# Patient Record
Sex: Male | Born: 1950 | Race: White | Hispanic: No | Marital: Single | Smoking: Former smoker
Health system: Southern US, Community
[De-identification: ages and names within clinical notes are randomized; demographics above are authoritative.]

## PROBLEM LIST (undated history)

## (undated) DIAGNOSIS — E559 Vitamin D deficiency, unspecified: Secondary | ICD-10-CM

## (undated) DIAGNOSIS — M1990 Unspecified osteoarthritis, unspecified site: Secondary | ICD-10-CM

## (undated) DIAGNOSIS — I639 Cerebral infarction, unspecified: Secondary | ICD-10-CM

## (undated) DIAGNOSIS — R7303 Prediabetes: Secondary | ICD-10-CM

## (undated) DIAGNOSIS — E785 Hyperlipidemia, unspecified: Secondary | ICD-10-CM

## (undated) DIAGNOSIS — I10 Essential (primary) hypertension: Secondary | ICD-10-CM

## (undated) DIAGNOSIS — N529 Male erectile dysfunction, unspecified: Secondary | ICD-10-CM

## (undated) DIAGNOSIS — K219 Gastro-esophageal reflux disease without esophagitis: Secondary | ICD-10-CM

## (undated) DIAGNOSIS — G459 Transient cerebral ischemic attack, unspecified: Secondary | ICD-10-CM

## (undated) HISTORY — DX: Prediabetes: R73.03

## (undated) HISTORY — DX: Vitamin D deficiency, unspecified: E55.9

## (undated) HISTORY — PX: HERNIA REPAIR: SHX51

## (undated) HISTORY — DX: Cerebral infarction, unspecified: I63.9

## (undated) HISTORY — DX: Essential (primary) hypertension: I10

## (undated) HISTORY — DX: Hyperlipidemia, unspecified: E78.5

## (undated) HISTORY — DX: Male erectile dysfunction, unspecified: N52.9

## (undated) HISTORY — DX: Unspecified osteoarthritis, unspecified site: M19.90

## (undated) HISTORY — DX: Gastro-esophageal reflux disease without esophagitis: K21.9

## (undated) HISTORY — DX: Transient cerebral ischemic attack, unspecified: G45.9

---

## 2002-04-23 ENCOUNTER — Emergency Department (HOSPITAL_COMMUNITY): Admission: EM | Admit: 2002-04-23 | Discharge: 2002-04-23 | Payer: Self-pay | Admitting: Diagnostic Radiology

## 2002-04-23 ENCOUNTER — Encounter: Payer: Self-pay | Admitting: Emergency Medicine

## 2005-03-19 ENCOUNTER — Ambulatory Visit (HOSPITAL_COMMUNITY): Admission: RE | Admit: 2005-03-19 | Discharge: 2005-03-19 | Payer: Self-pay | Admitting: Internal Medicine

## 2005-05-24 ENCOUNTER — Ambulatory Visit: Payer: Self-pay | Admitting: Gastroenterology

## 2007-03-30 ENCOUNTER — Ambulatory Visit (HOSPITAL_COMMUNITY): Admission: RE | Admit: 2007-03-30 | Discharge: 2007-03-30 | Payer: Self-pay | Admitting: Internal Medicine

## 2008-11-24 ENCOUNTER — Ambulatory Visit (HOSPITAL_COMMUNITY): Admission: RE | Admit: 2008-11-24 | Discharge: 2008-11-24 | Payer: Self-pay | Admitting: Internal Medicine

## 2012-12-09 LAB — HM COLONOSCOPY

## 2013-08-16 ENCOUNTER — Other Ambulatory Visit (HOSPITAL_COMMUNITY): Payer: Self-pay | Admitting: Internal Medicine

## 2013-08-16 ENCOUNTER — Ambulatory Visit (HOSPITAL_COMMUNITY)
Admission: RE | Admit: 2013-08-16 | Discharge: 2013-08-16 | Disposition: A | Discharge status: Home / Self Care | Payer: Self-pay | Source: Ambulatory Visit | Attending: Internal Medicine | Admitting: Internal Medicine

## 2013-08-16 DIAGNOSIS — R059 Cough, unspecified: Secondary | ICD-10-CM | POA: Insufficient documentation

## 2013-08-16 DIAGNOSIS — R05 Cough: Secondary | ICD-10-CM

## 2013-08-27 ENCOUNTER — Encounter: Payer: Self-pay | Admitting: Internal Medicine

## 2013-09-15 ENCOUNTER — Ambulatory Visit: Payer: BC Managed Care – PPO | Admitting: Emergency Medicine

## 2013-09-15 ENCOUNTER — Encounter: Payer: Self-pay | Admitting: Emergency Medicine

## 2013-09-15 VITALS — BP 122/80 | HR 62 | Temp 98.0°F | Resp 16 | Ht 71.0 in | Wt 196.0 lb

## 2013-09-15 DIAGNOSIS — J309 Allergic rhinitis, unspecified: Secondary | ICD-10-CM

## 2013-09-15 DIAGNOSIS — R7303 Prediabetes: Secondary | ICD-10-CM

## 2013-09-15 DIAGNOSIS — E785 Hyperlipidemia, unspecified: Secondary | ICD-10-CM

## 2013-09-15 DIAGNOSIS — I10 Essential (primary) hypertension: Secondary | ICD-10-CM

## 2013-09-15 DIAGNOSIS — K219 Gastro-esophageal reflux disease without esophagitis: Secondary | ICD-10-CM

## 2013-09-15 DIAGNOSIS — E559 Vitamin D deficiency, unspecified: Secondary | ICD-10-CM

## 2013-09-15 DIAGNOSIS — R7309 Other abnormal glucose: Secondary | ICD-10-CM | POA: Insufficient documentation

## 2013-09-15 LAB — LIPID PANEL
HDL: 37 mg/dL — ABNORMAL LOW (ref >39)
LDL Cholesterol: 63 mg/dL (ref 0–99)
Total CHOL/HDL Ratio: 3.5 Ratio
VLDL: 29 mg/dL (ref 0–40)

## 2013-09-15 LAB — BASIC METABOLIC PANEL WITH GFR
BUN: 17 mg/dL (ref 6–23)
CO2: 29 mEq/L (ref 19–32)
Chloride: 103 mEq/L (ref 96–112)
Creat: 0.82 mg/dL (ref 0.50–1.35)
GFR, Est Non African American: >89 mL/min
Potassium: 4 mEq/L (ref 3.5–5.3)

## 2013-09-15 LAB — CBC WITH DIFFERENTIAL/PLATELET
Basophils Absolute: 0.1 10*3/uL (ref 0.0–0.1)
Eosinophils Absolute: 0.3 10*3/uL (ref 0.0–0.7)
Eosinophils Relative: 5 % (ref 0–5)
HCT: 44.1 % (ref 39.0–52.0)
Hemoglobin: 15.1 g/dL (ref 13.0–17.0)
Lymphocytes Relative: 32 % (ref 12–46)
MCV: 94.6 fL (ref 78.0–100.0)
Monocytes Relative: 14 % — ABNORMAL HIGH (ref 3–12)
Neutro Abs: 2.3 10*3/uL (ref 1.7–7.7)
Platelets: 295 10*3/uL (ref 150–400)
RBC: 4.66 MIL/uL (ref 4.22–5.81)
RDW: 13.7 % (ref 11.5–15.5)
WBC: 4.9 10*3/uL (ref 4.0–10.5)

## 2013-09-15 LAB — HEPATIC FUNCTION PANEL
Alkaline Phosphatase: 58 U/L (ref 39–117)
Bilirubin, Direct: 0.1 mg/dL (ref 0.0–0.3)
Indirect Bilirubin: 0.3 mg/dL (ref 0.0–0.9)
Total Bilirubin: 0.4 mg/dL (ref 0.3–1.2)

## 2013-09-15 LAB — MAGNESIUM: Magnesium: 1.7 mg/dL (ref 1.5–2.5)

## 2013-09-15 NOTE — Patient Instructions (Signed)
Allergic Rhinitis Allergic rhinitis is when the mucous membranes in the nose respond to allergens. Allergens are particles in the air that cause your body to have an allergic reaction. This causes you to release allergic antibodies. Through a chain of events, these eventually cause you to release histamine into the blood stream (hence the use of antihistamines). Although meant to be protective to the body, it is this release that causes your discomfort, such as frequent sneezing, congestion and an itchy runny nose.  CAUSES  The pollen allergens may come from grasses, trees, and weeds. This is seasonal allergic rhinitis, or "hay fever." Other allergens cause year-round allergic rhinitis (perennial allergic rhinitis) such as house dust mite allergen, pet dander and mold spores.  SYMPTOMS   Nasal stuffiness (congestion).  Runny, itchy nose with sneezing and tearing of the eyes.  There is often an itching of the mouth, eyes and ears. It cannot be cured, but it can be controlled with medications. DIAGNOSIS  If you are unable to determine the offending allergen, skin or blood testing may find it. TREATMENT   Avoid the allergen.  Medications and allergy shots (immunotherapy) can help.  Hay fever may often be treated with antihistamines in pill or nasal spray forms. Antihistamines block the effects of histamine. There are over-the-counter medicines that may help with nasal congestion and swelling around the eyes. Check with your caregiver before taking or giving this medicine. If the treatment above does not work, there are many new medications your caregiver can prescribe. Stronger medications may be used if initial measures are ineffective. Desensitizing injections can be used if medications and avoidance fails. Desensitization is when a patient is given ongoing shots until the body becomes less sensitive to the allergen. Make sure you follow up with your caregiver if problems continue. SEEK MEDICAL  CARE IF:   You develop fever (more than 100.5 F (38.1 C).  You develop a cough that does not stop easily (persistent).  You have shortness of breath.  You start wheezing.  Symptoms interfere with normal daily activities. Document Released: 07/23/2001 Document Revised: 01/20/2012 Document Reviewed: 02/01/2009 [facility] Patient Information 2014 [place], [place]. Blood Glucose Monitoring, Adult Monitoring your blood glucose (also know as blood sugar) helps you to manage your diabetes. It also helps you and your health care provider monitor your diabetes and determine how well your treatment plan is working. WHY SHOULD YOU MONITOR YOUR BLOOD GLUCOSE?  It can help you understand how food, exercise, and medicine affect your blood glucose.  It allows you to know what your blood glucose is at any given moment. You can quickly tell if you are having low blood glucose (hypoglycemia) or high blood glucose (hyperglycemia).  It can help you and your health care provider know how to adjust your medicines.  It can help you understand how to manage an illness or adjust medicine for exercise. WHEN SHOULD YOU TEST? Your health care provider will help you decide how often you should check your blood glucose. This may depend on the type of diabetes you have, your diabetes control, or the types of medicines you are taking. Be sure to write down all of your blood glucose readings so that this information can be reviewed with your health care provider. See below for examples of testing times that your health care provider may suggest. Type 1 Diabetes  Test 4 times a day if you are in good control, using an insulin pump, or perform multiple daily injections.  If your diabetes  is not well-controlled or if you are sick, you may need to monitor more often.  It is a good idea to also monitor:  Before and after exercise.  Between meals and 2 hours after a meal.  Occasionally between 2:00 to 3:00 am. Type 2  Diabetes  It can vary with each person, but generally, if you are on insulin, test 4 times a day.  If you take medicines by mouth (orally), test 2 times a day.  If you are on a controlled diet, test once a day.  If your diabetes is not well controlled or if you are sick, you may need to monitor more often. HOW TO MONITOR YOUR BLOOD GLUCOSE Supplies Needed  Blood glucose meter.  Test strips for your meter. Each meter has its own strips. You must use the strips that go with your own meter.  A pricking needle (lancet).  A device that holds the lancet (lancing device).  A journal or log book to write down your results. Procedure  Wash your hands with soap and water. Alcohol is not preferred.  Prick the side of your finger (not the tip) with the lancet.  Gently milk the finger until a small drop of blood appears.  Follow the instructions that come with your meter for inserting the test strip, applying blood to the strip, and using your blood glucose meter. Other Areas to Get Blood for Testing Some meters allow you to use other areas of your body (other than your finger) to test your blood. These areas are called alternative sites. The most common alternative sites are:  The forearm.  The thigh.  The back area of the lower leg.  The palm of the hand. The blood flow in these areas is slower. Therefore, the blood glucose values you get may be delayed, and the numbers are different from what you would get from your fingers. Do not use alternative sites if you think you are having hypoglycemia. Your reading will not be accurate. Always use a finger if you are having hypoglycemia. Also, if you cannot feel your lows (hypoglycemia unawareness), always use your fingers for your blood glucose checks. ADDITIONAL TIPS FOR GLUCOSE MONITORING  Do not reuse lancets.  Always carry your supplies with you.  All blood glucose meters have a 24-hour "hotline" number to call if you have questions  or need help.  Adjust (calibrate) your blood glucose meter with a control solution after finishing a few boxes of strips. BLOOD GLUCOSE RECORD KEEPING It is a good idea to keep a daily record or log of your blood glucose readings. Most glucose meters, if not all, keep your glucose records stored in the meter. Some meters come with the ability to download your records to your home computer. Keeping a record of your blood glucose readings is especially helpful if you are wanting to look for patterns. Make notes to go along with the blood glucose readings because you might forget what happened at that exact time. Keeping good records helps you and your health care provider to work together to achieve good diabetes management.  Document Released: 10/31/2003 Document Revised: 06/30/2013 Document Reviewed: 03/22/2013 [facility] Patient Information 2014 [place], [place]. Hypertriglyceridemia  Diet for High blood levels of Triglycerides Most fats in food are triglycerides. Triglycerides in your blood are stored as fat in your body. High levels of triglycerides in your blood may put you at a greater risk for heart disease and stroke.  Normal triglyceride levels are less than 150  mg/dL. Borderline high levels are 150-199 mg/dl. High levels are 200 - 499 mg/dL, and very high triglyceride levels are greater than 500 mg/dL. The decision to treat high triglycerides is generally based on the level. For people with borderline or high triglyceride levels, treatment includes weight loss and exercise. Drugs are recommended for people with very high triglyceride levels. Many people who need treatment for high triglyceride levels have metabolic syndrome. This syndrome is a collection of disorders that often include: insulin resistance, high blood pressure, blood clotting problems, high cholesterol and triglycerides. TESTING PROCEDURE FOR TRIGLYCERIDES  You should not eat 4 hours before getting your triglycerides measured. The  normal range of triglycerides is between 10 and 250 milligrams per deciliter (mg/dl). Some people may have extreme levels (1000 or above), but your triglyceride level may be too high if it is above 150 mg/dl, depending on what other risk factors you have for heart disease.  People with high blood triglycerides may also have high blood cholesterol levels. If you have high blood cholesterol as well as high blood triglycerides, your risk for heart disease is probably greater than if you only had high triglycerides. High blood cholesterol is one of the main risk factors for heart disease. CHANGING YOUR DIET  Your weight can affect your blood triglyceride level. If you are more than 20% above your ideal body weight, you may be able to lower your blood triglycerides by losing weight. Eating less and exercising regularly is the best way to combat this. Fat provides more calories than any other food. The best way to lose weight is to eat less fat. Only 30% of your total calories should come from fat. Less than 7% of your diet should come from saturated fat. A diet low in fat and saturated fat is the same as a diet to decrease blood cholesterol. By eating a diet lower in fat, you may lose weight, lower your blood cholesterol, and lower your blood triglyceride level.  Eating a diet low in fat, especially saturated fat, may also help you lower your blood triglyceride level. Ask your dietitian to help you figure how much fat you can eat based on the number of calories your caregiver has prescribed for you.  Exercise, in addition to helping with weight loss may also help lower triglyceride levels.   Alcohol can increase blood triglycerides. You may need to stop drinking alcoholic beverages.  Too much carbohydrate in your diet may also increase your blood triglycerides. Some complex carbohydrates are necessary in your diet. These may include bread, rice, potatoes, other starchy vegetables and cereals.  Reduce "simple"  carbohydrates. These may include pure sugars, candy, honey, and jelly without losing other nutrients. If you have the kind of high blood triglycerides that is affected by the amount of carbohydrates in your diet, you will need to eat less sugar and less high-sugar foods. Your caregiver can help you with this.  Adding 2-4 grams of fish oil (EPA+ DHA) may also help lower triglycerides. Speak with your caregiver before adding any supplements to your regimen. Following the Diet  Maintain your ideal weight. Your caregivers can help you with a diet. Generally, eating less food and getting more exercise will help you lose weight. Joining a weight control group may also help. Ask your caregivers for a good weight control group in your area.  Eat low-fat foods instead of high-fat foods. This can help you lose weight too.  These foods are lower in fat. Eat MORE of these:  Dried beans, peas, and lentils.  Egg whites.  Low-fat cottage cheese.  Fish.  Lean cuts of meat, such as round, sirloin, rump, and flank (cut extra fat off meat you fix).  Whole grain breads, cereals and pasta.  Skim and nonfat dry milk.  Low-fat yogurt.  Poultry without the skin.  Cheese made with skim or part-skim milk, such as mozzarella, parmesan, farmers', ricotta, or pot cheese. These are higher fat foods. Eat LESS of these:   Whole milk and foods made from whole milk, such as American, blue, cheddar, monterey jack, and swiss cheese  High-fat meats, such as luncheon meats, sausages, knockwurst, bratwurst, hot dogs, ribs, corned beef, ground pork, and regular ground beef.  Fried foods. Limit saturated fats in your diet. Substituting unsaturated fat for saturated fat may decrease your blood triglyceride level. You will need to read package labels to know which products contain saturated fats.  These foods are high in saturated fat. Eat LESS of these:   Fried pork skins.  Whole milk.  Skin and fat from  poultry.  Palm oil.  Butter.  Shortening.  Cream cheese.  Tomasa Blase.  Margarines and baked goods made from listed oils.  Vegetable shortenings.  Chitterlings.  Fat from meats.  Coconut oil.  Palm kernel oil.  Lard.  Cream.  Sour cream.  Fatback.  Coffee whiteners and non-dairy creamers made with these oils.  Cheese made from whole milk. Use unsaturated fats (both polyunsaturated and monounsaturated) moderately. Remember, even though unsaturated fats are better than saturated fats; you still want a diet low in total fat.  These foods are high in unsaturated fat:   Canola oil.  Sunflower oil.  Mayonnaise.  Almonds.  Peanuts.  Pine nuts.  Margarines made with these oils.  Safflower oil.  Olive oil.  Avocados.  Cashews.  Peanut butter.  Sunflower seeds.  Soybean oil.  Peanut oil.  Olives.  Pecans.  Walnuts.  Pumpkin seeds. Avoid sugar and other high-sugar foods. This will decrease carbohydrates without decreasing other nutrients. Sugar in your food goes rapidly to your blood. When there is excess sugar in your blood, your liver may use it to make more triglycerides. Sugar also contains calories without other important nutrients.  Eat LESS of these:   Sugar, brown sugar, powdered sugar, jam, jelly, preserves, honey, syrup, molasses, pies, candy, cakes, cookies, frosting, pastries, colas, soft drinks, punches, fruit drinks, and regular gelatin.  Avoid alcohol. Alcohol, even more than sugar, may increase blood triglycerides. In addition, alcohol is high in calories and low in nutrients. Ask for sparkling water, or a diet soft drink instead of an alcoholic beverage. Suggestions for planning and preparing meals   Bake, broil, grill or roast meats instead of frying.  Remove fat from meats and skin from poultry before cooking.  Add spices, herbs, lemon juice or vinegar to vegetables instead of salt, rich sauces or gravies.  Use a non-stick  skillet without fat or use no-stick sprays.  Cool and refrigerate stews and broth. Then remove the hardened fat floating on the surface before serving.  Refrigerate meat drippings and skim off fat to make low-fat gravies.  Serve more fish.  Use less butter, margarine and other high-fat spreads on bread or vegetables.  Use skim or reconstituted non-fat dry milk for cooking.  Cook with low-fat cheeses.  Substitute low-fat yogurt or cottage cheese for all or part of the sour cream in recipes for sauces, dips or congealed salads.  Use half yogurt/half mayonnaise in salad  recipes.  Substitute evaporated skim milk for cream. Evaporated skim milk or reconstituted non-fat dry milk can be whipped and substituted for whipped cream in certain recipes.  Choose fresh fruits for dessert instead of high-fat foods such as pies or cakes. Fruits are naturally low in fat. When Dining Out   Order low-fat appetizers such as fruit or vegetable juice, pasta with vegetables or tomato sauce.  Select clear, rather than cream soups.  Ask that dressings and gravies be served on the side. Then use less of them.  Order foods that are baked, broiled, poached, steamed, stir-fried, or roasted.  Ask for margarine instead of butter, and use only a small amount.  Drink sparkling water, unsweetened tea or coffee, or diet soft drinks instead of alcohol or other sweet beverages. QUESTIONS AND ANSWERS ABOUT OTHER FATS IN THE BLOOD: SATURATED FAT, TRANS FAT, AND CHOLESTEROL What is trans fat? Trans fat is a type of fat that is formed when vegetable oil is hardened through a process called hydrogenation. This process helps makes foods more solid, gives them shape, and prolongs their shelf life. Trans fats are also called hydrogenated or partially hydrogenated oils.  What do saturated fat, trans fat, and cholesterol in foods have to do with heart disease? Saturated fat, trans fat, and cholesterol in the diet all raise  the level of LDL "bad" cholesterol in the blood. The higher the LDL cholesterol, the greater the risk for coronary heart disease (CHD). Saturated fat and trans fat raise LDL similarly.  What foods contain saturated fat, trans fat, and cholesterol? High amounts of saturated fat are found in animal products, such as fatty cuts of meat, chicken skin, and full-fat dairy products like butter, whole milk, cream, and cheese, and in tropical vegetable oils such as palm, palm kernel, and coconut oil. Trans fat is found in some of the same foods as saturated fat, such as vegetable shortening, some margarines (especially hard or stick margarine), crackers, cookies, baked goods, fried foods, salad dressings, and other processed foods made with partially hydrogenated vegetable oils. Small amounts of trans fat also occur naturally in some animal products, such as milk products, beef, and lamb. Foods high in cholesterol include liver, other organ meats, egg yolks, shrimp, and full-fat dairy products. How can I use the new food label to make heart-healthy food choices? Check the Nutrition Facts panel of the food label. Choose foods lower in saturated fat, trans fat, and cholesterol. For saturated fat and cholesterol, you can also use the Percent Daily Value (%DV): 5% DV or less is low, and 20% DV or more is high. (There is no %DV for trans fat.) Use the Nutrition Facts panel to choose foods low in saturated fat and cholesterol, and if the trans fat is not listed, read the ingredients and limit products that list shortening or hydrogenated or partially hydrogenated vegetable oil, which tend to be high in trans fat. POINTS TO REMEMBER:   Discuss your risk for heart disease with your caregivers, and take steps to reduce risk factors.  Change your diet. Choose foods that are low in saturated fat, trans fat, and cholesterol.  Add exercise to your daily routine if it is not already being done. Participate in physical activity  of moderate intensity, like brisk walking, for at least 30 minutes on most, and preferably all days of the week. No time? Break the 30 minutes into three, 10-minute segments during the day.  Stop smoking. If you do smoke, contact your caregiver to  discuss ways in which they can help you quit.  Do not use street drugs.  Maintain a normal weight.  Maintain a healthy blood pressure.  Keep up with your blood work for checking the fats in your blood as directed by your caregiver. Document Released: 08/15/2004 Document Revised: 04/28/2012 Document Reviewed: 03/13/2009 [facility] Patient Information 2014 [place], [place]. Hypertension Hypertension is another name for high blood pressure. High blood pressure may mean that your heart needs to work harder to pump blood. Blood pressure consists of two numbers, which includes a higher number over a lower number (example: 110/72). HOME CARE   Make lifestyle changes as told by your doctor. This may include weight loss and exercise.  Take your blood pressure medicine every day.  Limit how much salt you use.  Stop smoking if you smoke.  Do not use drugs.  Talk to your doctor if you are using decongestants or birth control pills. These medicines might make blood pressure higher.  Females should not drink more than 1 alcoholic drink per day. Males should not drink more than 2 alcoholic drinks per day.  See your doctor as told. GET HELP RIGHT AWAY IF:   You have a blood pressure reading with a top number of 180 or higher.  You get a very bad headache.  You get blurred or changing vision.  You feel confused.  You feel weak, numb, or faint.  You get chest or belly (abdominal) pain.  You throw up (vomit).  You cannot breathe very well. MAKE SURE YOU:   Understand these instructions.  Will watch your condition.  Will get help right away if you are not doing well or get worse. Document Released: 04/15/2008 Document Revised: 01/20/2012  Document Reviewed: 04/15/2008 [facility] Patient Information 2014 [place], [place].

## 2013-09-16 ENCOUNTER — Telehealth: Payer: Self-pay | Admitting: *Deleted

## 2013-09-16 ENCOUNTER — Encounter: Payer: Self-pay | Admitting: Emergency Medicine

## 2013-09-16 ENCOUNTER — Telehealth: Payer: Self-pay | Admitting: Internal Medicine

## 2013-09-16 DIAGNOSIS — E559 Vitamin D deficiency, unspecified: Secondary | ICD-10-CM

## 2013-09-16 DIAGNOSIS — K219 Gastro-esophageal reflux disease without esophagitis: Secondary | ICD-10-CM

## 2013-09-16 LAB — HEMOGLOBIN A1C
Hgb A1c MFr Bld: 5.7 % — ABNORMAL HIGH (ref <5.7)
Mean Plasma Glucose: 117 mg/dL — ABNORMAL HIGH (ref <117)

## 2013-09-16 MED ORDER — VITAMIN D 1000 UNITS PO TABS: 50000.0000 [IU] | ORAL_TABLET | Freq: Every day | ORAL | Status: DC

## 2013-09-16 MED ORDER — PANTOPRAZOLE SODIUM 40 MG PO TBEC: 40.0000 mg | DELAYED_RELEASE_TABLET | Freq: Every day | ORAL | Status: DC

## 2013-09-16 NOTE — Progress Notes (Signed)
Subjective:    Patient ID: Steven Bray, male    DOB: [birth year], 62 y.o.   MRN: [id number]  HPI Comments: 62 yo male comes in for 3 month F/U for HTN, Cholesterol, PRE-DM and Vit D. He is trying to decrease red meat and improve diet. He exercises with work only. His BP has been good at home. He is still feeling congested in his ears but denies taking OTC for relief. He completed Doxycycline and Prednisone prescribed 10/6 with some relief. He would also like sx of Cialis 5mg  qd to assist with his hx of ED. His last labs were T 107 TG 150 HDL 30 LDL 47 A1C 5.6 MAG 1.8 D 103   Current Outpatient Prescriptions on File Prior to Visit  Medication Sig Dispense Refill  . aspirin 81 MG tablet Take 81 mg by mouth daily.      Marland Kitchen atenolol (TENORMIN) 100 MG tablet Take 100 mg by mouth daily.      Marland Kitchen atorvastatin (LIPITOR) 80 MG tablet Take 80 mg by mouth daily. TAKES 1/2      . cholecalciferol (VITAMIN D) 1000 UNITS tablet Take 50,000 Units by mouth daily. ONLY MWF      . hydrochlorothiazide (HYDRODIURIL) 25 MG tablet Take 25 mg by mouth daily. TAKES 1/2      . lisinopril (PRINIVIL,ZESTRIL) 40 MG tablet Take 40 mg by mouth daily. TAKES 1/2       No current facility-administered medications on file prior to visit.  Allergies Sulfa antibiotics  Past Medical History  Diagnosis Date  . Hypertension   . Hyperlipidemia   . Pre-diabetes   . GERD (gastroesophageal reflux disease)   . Vitamin D deficiency   . TIA (transient ischemic attack)   . Stroke     Review of Systems  Constitutional: Negative.   HENT: Positive for congestion and postnasal drip.   Eyes: Negative.   Respiratory: Negative.   Cardiovascular: Negative.   Gastrointestinal: Negative.   Genitourinary:       Difficulty maintaining erection, NO Change  Musculoskeletal: Negative.   Skin: Negative.   Allergic/Immunologic: Negative.   Neurological: Negative.   Psychiatric/Behavioral: Negative.       BP 122/80  Pulse 62   Temp(Src) 98 F (36.7 C)  Resp 16  Ht 5\' 11"  (1.803 m)  Wt 196 lb (88.905 kg)  BMI 27.35 kg/m2  Objective:   Physical Exam  Nursing note and vitals reviewed. Constitutional: He is oriented to person, place, and time. He appears well-developed and well-nourished.  HENT:  Head: Normocephalic and atraumatic.  Right Ear: External ear normal.  Left Ear: External ear normal.  Nose: Nose normal.  Mouth/Throat: Oropharynx is clear and moist.  Cloudy TM's, Clear exudate post pharynx  Neck: Normal range of motion. Neck supple.  Cardiovascular: Normal rate, regular rhythm, normal heart sounds and intact distal pulses.   Pulmonary/Chest: Effort normal and breath sounds normal.  Abdominal: Soft. Bowel sounds are normal.  Musculoskeletal: Normal range of motion.  Neurological: He is alert and oriented to person, place, and time. He has normal reflexes.  Skin: Skin is warm and dry.  Psychiatric: He has a normal mood and affect. Judgment and thought content normal.          Assessment & Plan:  HTN, Cholesterol, Pre-DM, D-Def check labs. Advised increase cardio and improve diet. Continue RX/ Vitamins AD.  Allergic Rhinitis add allegra OTC AD. ED- SX of Cialis 5 mg 1 QD #30 given, call if wants RX  Pt aware of risks with RX/ SX.

## 2013-09-16 NOTE — Telephone Encounter (Signed)
Spoke with pt about 09/15/13 labs.  Per pt he has already decreased Atorvastatin Rx to 1/2 M,W,F.  Per Loree Fee, PA-C pt advised to decrease to taking 1/2 Mondays and Fridays only.  Pt advised to increase exercise AD.

## 2013-09-16 NOTE — Telephone Encounter (Signed)
Message copied by Nicholaus Corolla A on Thu Sep 16, 2013  1:37 PM ------      Message from: [place], [place] R      Created: Thu Sep 16, 2013  8:51 AM       Chol great can decrease to MWF only. HDL low needs to add cardio outside of work. A1c still elevated need better diet and wt loss. Rest ok No sign of infection. ------

## 2013-09-16 NOTE — Telephone Encounter (Signed)
Fax refill for pantoprazole sod dr 40mg  tab 30qty 12refills [facility] [address] tele # [phone]

## 2013-12-03 ENCOUNTER — Other Ambulatory Visit: Payer: Self-pay | Admitting: Internal Medicine

## 2013-12-12 DIAGNOSIS — Z79899 Other long term (current) drug therapy: Secondary | ICD-10-CM | POA: Insufficient documentation

## 2013-12-12 NOTE — Patient Instructions (Addendum)
Benign Prostatic Hypertrophy  The prostate gland is part of the reproductive system of men. A normal prostate is about the size and shape of a walnut. The prostate gland produces a fluid that is mixed with sperm to make semen. This gland surrounds the urethra and is located in front of the rectum and just below the bladder. The bladder is where urine is stored. The urethra is the tube through which urine passes from the bladder to get out of the body. The prostate grows as a man ages. An enlarged prostate not caused by cancer is called benign prostatic hypertrophy (BPH). An enlarged prostate can press on the urethra. This can make it harder to pass urine. In the early stages of enlargement, the bladder can get by with a narrowed urethra by forcing the urine through. If the problem gets worse, medical or surgical treatment may be required.  This condition should be followed by your health care provider. The accumulation of urine in the bladder can cause infection. Back pressure and infection can progress to bladder damage and kidney (renal) failure. If needed, your health care provider may refer you to a specialist in kidney and prostate disease (urologist). CAUSES  BPH is a common health problem in men older than 50 years. This condition is a normal part of aging. However, not all men will develop problems from this condition. If the enlargement grows away from the urethra, then there will not be any compression of the urethra and resistance to urine flow.If the growth is toward the urethra and compresses it, you will experience difficulty urinating.  SYMPTOMS   Not able to completely empty your bladder.  Getting up often during the night to urinate.  Need to urinate frequently during the day.  Difficultly starting urine flow.  Decrease in size and strength of your urine stream.  Dribbling after urination.  Pain on urination (more common with infection).  Inability to pass urine. This needs  immediate treatment.  The development of a urinary tract infection. DIAGNOSIS  These tests will help your health care provider understand your problem:  A thorough history and physical examination.  A urination history, with the number of times you urinate, the amounts of urine, the strength of the urine stream, and the feeling of emptiness or fullness after urinating.  A postvoid bladder scan that measures any amount of urine that may remain in your bladder after you finish urinating.  Digital rectal exam. In a rectal exam, your health care provider checks your prostate by putting a gloved, lubricated finger into your rectum to feel the back of your prostate gland. This exam detects the size of your gland and abnormal lumps or growths.  Exam of your urine (urinalysis).  Prostate specific antigen (PSA) screening. This is a blood test used to screen for prostate cancer.  Rectal ultrasonography. This test uses sound waves to electronically produce a picture of your prostate gland. TREATMENT  Once symptoms begin, your health care provider will monitor your condition. Of the men with this condition, one third will have symptoms that stabilize, one third will have symptoms that improve, and one third will have symptoms that progress in the first year. Mild symptoms may not need treatment. Simple observation and yearly exams may be all that is required. Medicines and surgery are options for more severe problems. Your health care provider can help you make an informed decision for what is best. Two classes of medicines are available for relief of prostate symptoms:  Medicines that shrink the prostate. This helps relieve symptoms. These medicines take time to work, and it may be months before any improvement is seen.  Uncommon side effects include problems with sexual function.  Medicines to relax the muscle of the prostate. This also relieves the obstruction by reducing any compression on the  urethra.This group of medicines work much faster than those that reduce the size of the prostate gland. Usually, one can experience improvement in days to weeks..  Side effects can include dizziness, fatigue, lightheadedness, and retrograde ejaculation (diminished volume of ejaculate). Several types of surgical treatments are available for relief of prostate symptoms:  Transurethral resection of the prostate (TURP) In this treatment, an instrument is inserted through opening at the tip of the penis. It is used to cut away pieces of the inner core of the prostate. The pieces are removed through the same opening of the penis. This removes the obstruction and helps get rid of the symptoms.  Transurethral incision (TUIP) In this procedure, small cuts are made in the prostate. This lessens the prostates pressure on the urethra.  Transurethral microwave thermotherapy (TUMT) This procedure uses microwaves to create heat. The heat destroys and removes a small amount of prostate tissue.  Transurethral needle ablation (TUNA) This is a procedure that uses radio frequencies to do the same as TUMT.  Interstitial laser coagulation (ILC) This is a procedure that uses a laser to do the same as TUMT and TUNA.  Transurethral electrovaporization (TUVP) This is a procedure that uses electrodes to do the same as the procedures listed above. SEEK MEDICAL CARE IF:   You develop a fever.  There is unexplained back pain.  Symptoms are not helped by medicines prescribed.  You develop side effects from the medicine you are taking.  Your urine becomes very dark or has a bad smell.  Your lower abdomen becomes distended and you have difficulty passing your urine. SEEK IMMEDIATE MEDICAL CARE IF:   You are suddenly unable to urinate. This is an emergency. You should be seen immediately.  There are large amounts of blood or clots in the urine.  Your urinary problems become unmanageable.  You develop  lightheadedness, severe dizziness, or you feel faint.  You develop moderate to severe low back or flank pain.  You develop chills or fever. Document Released: 10/28/2005 Document Revised: 08/18/2013 Document Reviewed: 05/13/2013 [facility] Patient Information 2014 [place], [place]. Hypertension As your heart beats, it forces blood through your arteries. This force is your blood pressure. If the pressure is too high, it is called hypertension (HTN) or high blood pressure. HTN is dangerous because you may have it and not know it. High blood pressure may mean that your heart has to work harder to pump blood. Your arteries may be narrow or stiff. The extra work puts you at risk for heart disease, stroke, and other problems.  Blood pressure consists of two numbers, a higher number over a lower, 110/72, for example. It is stated as "110 over 72." The ideal is below 120 for the top number (systolic) and under 80 for the bottom (diastolic). Write down your blood pressure today. You should pay close attention to your blood pressure if you have certain conditions such as:  Heart failure.  Prior heart attack.  Diabetes  Chronic kidney disease.  Prior stroke.  Multiple risk factors for heart disease. To see if you have HTN, your blood pressure should be measured while you are seated with your arm held at the  level of the heart. It should be measured at least twice. A one-time elevated blood pressure reading (especially in the Emergency Department) does not mean that you need treatment. There may be conditions in which the blood pressure is different between your right and left arms. It is important to see your caregiver soon for a recheck. Most people have essential hypertension which means that there is not a specific cause. This type of high blood pressure may be lowered by changing lifestyle factors such as:  Stress.  Smoking.  Lack of exercise.  Excessive weight.  Drug/tobacco/alcohol  use.  Eating less salt. Most people do not have symptoms from high blood pressure until it has caused damage to the body. Effective treatment can often prevent, delay or reduce that damage. TREATMENT  When a cause has been identified, treatment for high blood pressure is directed at the cause. There are a large number of medications to treat HTN. These fall into several categories, and your caregiver will help you select the medicines that are best for you. Medications may have side effects. You should review side effects with your caregiver. If your blood pressure stays high after you have made lifestyle changes or started on medicines,   Your medication(s) may need to be changed.  Other problems may need to be addressed.  Be certain you understand your prescriptions, and know how and when to take your medicine.  Be sure to follow up with your caregiver within the time frame advised (usually within two weeks) to have your blood pressure rechecked and to review your medications.  If you are taking more than one medicine to lower your blood pressure, make sure you know how and at what times they should be taken. Taking two medicines at the same time can result in blood pressure that is too low. SEEK IMMEDIATE MEDICAL CARE IF:  You develop a severe headache, blurred or changing vision, or confusion.  You have unusual weakness or numbness, or a faint feeling.  You have severe chest or abdominal pain, vomiting, or breathing problems. MAKE SURE YOU:   Understand these instructions.  Will watch your condition.  Will get help right away if you are not doing well or get worse.   Diabetes and Exercise Exercising regularly is important. It is not just about losing weight. It has many health benefits, such as:  Improving your overall fitness, flexibility, and endurance.  Increasing your bone density.  Helping with weight control.  Decreasing your body fat.  Increasing your muscle  strength.  Reducing stress and tension.  Improving your overall health. People with diabetes who exercise gain additional benefits because exercise:  Reduces appetite.  Improves the body's use of blood sugar (glucose).  Helps lower or control blood glucose.  Decreases blood pressure.  Helps control blood lipids (such as cholesterol and triglycerides).  Improves the body's use of the hormone insulin by:  Increasing the body's insulin sensitivity.  Reducing the body's insulin needs.  Decreases the risk for heart disease because exercising:  Lowers cholesterol and triglycerides levels.  Increases the levels of good cholesterol (such as high-density lipoproteins [HDL]) in the body.  Lowers blood glucose levels. YOUR ACTIVITY PLAN  Choose an activity that you enjoy and set realistic goals. Your health care provider or diabetes educator can help you make an activity plan that works for you. You can break activities into 2 or 3 sessions throughout the day. Doing so is as good as one long session. Exercise ideas include:  Taking the dog for a walk.  Taking the stairs instead of the elevator.  Dancing to your favorite song.  Doing your favorite exercise with a friend. RECOMMENDATIONS FOR EXERCISING WITH TYPE 1 OR TYPE 2 DIABETES   Check your blood glucose before exercising. If blood glucose levels are greater than 240 mg/dL, check for urine ketones. Do not exercise if ketones are present.  Avoid injecting insulin into areas of the body that are going to be exercised. For example, avoid injecting insulin into:  The arms when playing tennis.  The legs when jogging.  Keep a record of:  Food intake before and after you exercise.  Expected peak times of insulin action.  Blood glucose levels before and after you exercise.  The type and amount of exercise you have done.  Review your records with your health care provider. Your health care provider will help you to develop  guidelines for adjusting food intake and insulin amounts before and after exercising.  If you take insulin or oral hypoglycemic agents, watch for signs and symptoms of hypoglycemia. They include:  Dizziness.  Shaking.  Sweating.  Chills.  Confusion.  Drink plenty of water while you exercise to prevent dehydration or heat stroke. Body water is lost during exercise and must be replaced.  Talk to your health care provider before starting an exercise program to make sure it is safe for you. Remember, almost any type of activity is better than none.    Cholesterol Cholesterol is a white, waxy, fat-like protein needed by your body in small amounts. The liver makes all the cholesterol you need. It is carried from the liver by the blood through the blood vessels. Deposits (plaque) may build up on blood vessel walls. This makes the arteries narrower and stiffer. Plaque increases the risk for heart attack and stroke. You cannot feel your cholesterol level even if it is very high. The only way to know is by a blood test to check your lipid (fats) levels. Once you know your cholesterol levels, you should keep a record of the test results. Work with your caregiver to to keep your levels in the desired range. WHAT THE RESULTS MEAN:  Total cholesterol is a rough measure of all the cholesterol in your blood.  LDL is the so-called bad cholesterol. This is the type that deposits cholesterol in the walls of the arteries. You want this level to be low.  HDL is the good cholesterol because it cleans the arteries and carries the LDL away. You want this level to be high.  Triglycerides are fat that the body can either burn for energy or store. High levels are closely linked to heart disease. DESIRED LEVELS:  Total cholesterol below 200.  LDL below 100 for people at risk, below 70 for very high risk.  HDL above 50 is good, above 60 is best.  Triglycerides below 150. HOW TO LOWER YOUR  CHOLESTEROL:  Diet.  Choose fish or white meat chicken and [place], roasted or baked. Limit fatty cuts of red meat, fried foods, and processed meats, such as sausage and lunch meat.  Eat lots of fresh fruits and vegetables. Choose whole grains, beans, pasta, potatoes and cereals.  Use only small amounts of olive, corn or canola oils. Avoid butter, mayonnaise, shortening or palm kernel oils. Avoid foods with trans-fats.  Use skim/nonfat milk and low-fat/nonfat yogurt and cheeses. Avoid whole milk, cream, ice cream, egg yolks and cheeses. Healthy desserts include angel food cake, ginger snaps, animal crackers,  hard candy, popsicles, and low-fat/nonfat frozen yogurt. Avoid pastries, cakes, pies and cookies.  Exercise.  A regular program helps decrease LDL and raises HDL.  Helps with weight control.  Do things that increase your activity level like gardening, walking, or taking the stairs.  Medication.  May be prescribed by your caregiver to help lowering cholesterol and the risk for heart disease.  You may need medicine even if your levels are normal if you have several risk factors. HOME CARE INSTRUCTIONS   Follow your diet and exercise programs as suggested by your caregiver.  Take medications as directed.  Have blood work done when your caregiver feels it is necessary. MAKE SURE YOU:   Understand these instructions.  Will watch your condition.  Will get help right away if you are not doing well or get worse.      Vitamin D Deficiency Vitamin D is an important vitamin that your body needs. Having too little of it in your body is called a deficiency. A very bad deficiency can make your bones soft and can cause a condition called rickets.  Vitamin D is important to your body for different reasons, such as:   It helps your body absorb 2 minerals called calcium and phosphorus.  It helps make your bones healthy.  It may prevent some diseases, such as diabetes and multiple  sclerosis.  It helps your muscles and heart. You can get vitamin D in several ways. It is a natural part of some foods. The vitamin is also added to some dairy products and cereals. Some people take vitamin D supplements. Also, your body makes vitamin D when you are in the sun. It changes the sun's rays into a form of the vitamin that your body can use. CAUSES   Not eating enough foods that contain vitamin D.  Not getting enough sunlight.  Having certain digestive system diseases that make it hard to absorb vitamin D. These diseases include Crohn's disease, chronic pancreatitis, and cystic fibrosis.  Having a surgery in which part of the stomach or small intestine is removed.  Being obese. Fat cells pull vitamin D out of your blood. That means that obese people may not have enough vitamin D left in their blood and in other body tissues.  Having chronic kidney or liver disease. RISK FACTORS Risk factors are things that make you more likely to develop a vitamin D deficiency. They include:  Being older.  Not being able to get outside very much.  Living in a nursing home.  Having had broken bones.  Having weak or thin bones (osteoporosis).  Having a disease or condition that changes how your body absorbs vitamin D.  Having dark skin.  Some medicines such as seizure medicines or steroids.  Being overweight or obese. SYMPTOMS Mild cases of vitamin D deficiency may not have any symptoms. If you have a very bad case, symptoms may include:  Bone pain.  Muscle pain.  Falling often.  Broken bones caused by a minor injury, due to osteoporosis. DIAGNOSIS A blood test is the best way to tell if you have a vitamin D deficiency. TREATMENT Vitamin D deficiency can be treated in different ways. Treatment for vitamin D deficiency depends on what is causing it. Options include:  Taking vitamin D supplements.  Taking a calcium supplement. Your caregiver will suggest what dose is best  for you. HOME CARE INSTRUCTIONS  Take any supplements that your caregiver prescribes. Follow the directions carefully. Take only the suggested amount.  Have your blood tested 2 months after you start taking supplements.  Eat foods that contain vitamin D. Healthy choices include:  Fortified dairy products, cereals, or juices. Fortified means vitamin D has been added to the food. Check the label on the package to be sure.  Fatty fish like salmon or trout.  Eggs.  Oysters.  Do not use a tanning bed.  Keep your weight at a healthy level. Lose weight if you need to.  Keep all follow-up appointments. Your caregiver will need to perform blood tests to make sure your vitamin D deficiency is going away. SEEK MEDICAL CARE IF:  You have any questions about your treatment.  You continue to have symptoms of vitamin D deficiency.  You have nausea or vomiting.  You are constipated.  You feel confused.  You have severe abdominal or back pain. MAKE SURE YOU:  Understand these instructions.  Will watch your condition.  Will get help right away if you are not doing well or get worse.

## 2013-12-12 NOTE — Progress Notes (Signed)
Patient ID: Steven Bray, male   DOB: 1951/07/01, 63 y.o.   MRN: [id number]   Annual Screening Comprehensive Examination  This very nice 63 y.o.  DWM presents for complete physical.  Patient has been followed for HTN, Hx/o TIA, Prediabetes, Hyperlipidemia, and Vitamin D Deficiency.   HTN predates since 2002 when he presented with a LUE monoparesis which resolved and was felt to represent a TIA while he was residing in [place]. Patient's BP has been controlled at home.Today's BP: 114/66 mmHg. Patient denies any cardiac symptoms as chest pain, palpitations, shortness of breath, dizziness or ankle swelling.   Patient's hyperlipidemia is controlled and at goal with diet and medications. Patient denies myalgias or other medication SE's.  Lab Results  Component Value Date   CHOL 129 09/15/2013   HDL 37* 09/15/2013   LDLCALC 63 09/15/2013   TRIG 145 09/15/2013   CHOLHDL 3.5 09/15/2013    Patient has prediabetes with A1c of 5.7% in Apr 2013. In Jan 2014 his A1c was 6.1%.. Patient denies reactive hypoglycemic symptoms, visual blurring, diabetic polys, or paresthesias.    Finally, patient has history of Vitamin D Deficiency of 12 in 2008 with last vitamin D 103 in August.   Medication List         aspirin 81 MG tablet  Take 81 mg by mouth daily.     atenolol 100 MG tablet  Commonly known as:  TENORMIN  Take 100 mg by mouth daily.     atorvastatin 80 MG tablet  Commonly known as:  LIPITOR  Take 80 mg by mouth daily. TAKES 1/2     hydrochlorothiazide 25 MG tablet  Commonly known as:  HYDRODIURIL  Take 25 mg by mouth daily. TAKES 1/2     lisinopril 40 MG tablet  Commonly known as:  PRINIVIL,ZESTRIL  Take 40 mg by mouth daily. TAKES 1/2     pantoprazole 40 MG tablet  Commonly known as:  PROTONIX  Take 1 tablet (40 mg total) by mouth daily.     sildenafil 100 MG tablet  Commonly known as:  VIAGRA  Take 100 mg by mouth daily as needed for erectile dysfunction.     tamsulosin 0.4 MG  Caps capsule  Commonly known as:  FLOMAX  Take 1 capsule (0.4 mg total) by mouth daily. For prostate     Vitamin D (Ergocalciferol) 50000 UNITS Caps capsule  Commonly known as:  DRISDOL  TAKE 1 CAPSULE DAILY        Allergies  Allergen Reactions  . Sulfa Antibiotics Nausea Only    Past Medical History  Diagnosis Date  . Hypertension   . Hyperlipidemia   . Pre-diabetes   . GERD (gastroesophageal reflux disease)   . Vitamin D deficiency   . TIA (transient ischemic attack)   . Stroke   . ED (erectile dysfunction)     Past Surgical History  Procedure Laterality Date  . Hernia repair Right     INGUINAL    Family History  Problem Relation Age of Onset  . Cancer Mother     LYMPHOMA  . Hyperlipidemia Mother   . Hypertension Mother   . Diabetes Father   . Hypertension Father   . Stroke Father   . Kidney disease Father   . Diabetes Sister   . Early death Daughter   . Drug abuse Son     DIED FROM OD    History   Social History  . Marital Status: Single    Spouse  Name: N/A    Number of Children: N/A  . Years of Education: N/A   Occupational History  . Not on file.   Social History Main Topics  . Smoking status: Current Every Day Smoker -- 0.75 packs/day for 42 years    Types: Cigarettes  . Smokeless tobacco: Not on file  . Alcohol Use: No     Comment: OCC  . Drug Use: No  . Sexual Activity: Yes    Birth Control/ Protection: None      ROS Constitutional: Denies fever, chills, weight loss/gain, headaches, insomnia, fatigue, night sweats, and change in appetite. Eyes: Denies redness, blurred vision, diplopia, discharge, itchy, watery eyes.  ENT: Denies discharge, congestion, post nasal drip, epistaxis, sore throat, earache, hearing loss, dental pain, Tinnitus, Vertigo, Sinus pain, snoring.  Cardio: Denies chest pain, palpitations, irregular heartbeat, syncope, dyspnea, diaphoresis, orthopnea, PND, claudication, edema Respiratory: denies cough, dyspnea,  DOE, pleurisy, hoarseness, laryngitis, wheezing.  Gastrointestinal: Denies dysphagia, heartburn, reflux, water brash, pain, cramps, nausea, vomiting, bloating, diarrhea, constipation, hematemesis, melena, hematochezia, jaundice, hemorrhoids Genitourinary: Denies dysuria, frequency, nocturia, discharge, hematuria, flank pain. He does report some problems with hesitancy and urgency. Musculoskeletal: Denies arthralgia, myalgia, stiffness, Jt. Swelling, pain, limp, and strain/sprain. Skin: Denies puritis, rash, hives, warts, acne, eczema, changing in skin lesion Neuro: No weakness, tremor, incoordination, spasms, paresthesia, pain Psychiatric: Denies confusion, memory loss, sensory loss Endocrine: Denies change in weight, skin, hair change, nocturia, and paresthesia, diabetic polys, visual blurring, hyper / hypo glycemic episodes.  Heme/Lymph: No excessive bleeding, bruising, or elarged lymph nodes.  BP: 114/66  Pulse: 60  Temp: 98.1 F (36.7 C)  Resp: 16    Estimated body mass index is 28.16 kg/(m^2) as calculated from the following:   Height as of this encounter: 5\' 11"  (1.803 m).   Weight as of this encounter: 201 lb 12.8 oz (91.536 kg).  Physical Exam General Appearance: Well nourished, in no apparent distress. Eyes: PERRLA, EOMs, conjunctiva no swelling or erythema, normal fundi and vessels. Sinuses: No frontal/maxillary tenderness ENT/Mouth: EACs patent / TMs  nl. Nares clear without erythema, swelling, mucoid exudates. Oral hygiene is good. No erythema, swelling, or exudate. Tongue normal, non-obstructing. Tonsils not swollen or erythematous. Hearing normal.  Neck: Supple, thyroid normal. No bruits, nodes or JVD. Respiratory: Respiratory effort normal.  BS equal and clear bilateral without rales, rhonci, wheezing or stridor. Cardio: Heart sounds are normal with regular rate and rhythm and no murmurs, rubs or gallops. Peripheral pulses are normal and equal bilaterally without edema. No  aortic or femoral bruits. Chest: symmetric with normal excursions and percussion.  Abdomen: Flat, soft, with bowl sounds. Nontender, no guarding, rebound, hernias, masses, or organomegaly.  Lymphatics: Non tender without lymphadenopathy.  Genitourinary: No hernias.Testes nl. DRE - prostate nl for age - smooth & firm w/o nodules. Musculoskeletal: Full ROM all peripheral extremities, joint stability, 5/5 strength, and normal gait. Skin: Warm and dry without rashes, lesions, cyanosis, clubbing or  ecchymosis.  Neuro: Cranial nerves intact, reflexes equal bilaterally. Normal muscle tone, no cerebellar symptoms. Sensation intact.  Pysch: Awake and oriented X 3, normal affect, insight and judgment appropriate.   Assessment and Plan  1. Annual Screening Examination 2. Hypertension / Hx/o TIA 3. Hyperlipidemia 4. Pre Diabetes 5. Vitamin D Deficiency  Continue prudent diet as discussed, weight control, BP monitoring, regular exercise, and medications as discussed.  Discussed med effects and SE's. Routine screening labs and tests as requested with regular follow-up as recommended.

## 2013-12-13 ENCOUNTER — Ambulatory Visit (INDEPENDENT_AMBULATORY_CARE_PROVIDER_SITE_OTHER): Payer: BC Managed Care – PPO | Admitting: Internal Medicine

## 2013-12-13 ENCOUNTER — Encounter: Payer: Self-pay | Admitting: Internal Medicine

## 2013-12-13 VITALS — BP 114/66 | HR 60 | Temp 98.1°F | Resp 16 | Ht 71.0 in | Wt 201.8 lb

## 2013-12-13 DIAGNOSIS — Z1159 Encounter for screening for other viral diseases: Secondary | ICD-10-CM

## 2013-12-13 DIAGNOSIS — Z118 Encounter for screening for other infectious and parasitic diseases: Secondary | ICD-10-CM

## 2013-12-13 DIAGNOSIS — N320 Bladder-neck obstruction: Secondary | ICD-10-CM

## 2013-12-13 DIAGNOSIS — E559 Vitamin D deficiency, unspecified: Secondary | ICD-10-CM

## 2013-12-13 DIAGNOSIS — Z111 Encounter for screening for respiratory tuberculosis: Secondary | ICD-10-CM

## 2013-12-13 DIAGNOSIS — Z79899 Other long term (current) drug therapy: Secondary | ICD-10-CM

## 2013-12-13 DIAGNOSIS — Z125 Encounter for screening for malignant neoplasm of prostate: Secondary | ICD-10-CM

## 2013-12-13 DIAGNOSIS — Z113 Encounter for screening for infections with a predominantly sexual mode of transmission: Secondary | ICD-10-CM

## 2013-12-13 DIAGNOSIS — R740 Nonspecific elevation of levels of transaminase and lactic acid dehydrogenase [LDH]: Secondary | ICD-10-CM

## 2013-12-13 DIAGNOSIS — Z0000 Encounter for general adult medical examination without abnormal findings: Secondary | ICD-10-CM

## 2013-12-13 DIAGNOSIS — I10 Essential (primary) hypertension: Secondary | ICD-10-CM

## 2013-12-13 DIAGNOSIS — R7402 Elevation of levels of lactic acid dehydrogenase (LDH): Secondary | ICD-10-CM

## 2013-12-13 DIAGNOSIS — R7401 Elevation of levels of liver transaminase levels: Secondary | ICD-10-CM

## 2013-12-13 DIAGNOSIS — Z1212 Encounter for screening for malignant neoplasm of rectum: Secondary | ICD-10-CM

## 2013-12-13 MED ORDER — TAMSULOSIN HCL 0.4 MG PO CAPS: 0.4000 mg | ORAL_CAPSULE | Freq: Every day | ORAL | Status: DC

## 2013-12-14 LAB — CBC WITH DIFFERENTIAL/PLATELET
Basophils Absolute: 0.1 10*3/uL (ref 0.0–0.1)
Basophils Relative: 1 % (ref 0–1)
EOS ABS: 0.2 10*3/uL (ref 0.0–0.7)
Eosinophils Relative: 3 % (ref 0–5)
HCT: 44.1 % (ref 39.0–52.0)
HEMOGLOBIN: 15.1 g/dL (ref 13.0–17.0)
LYMPHS ABS: 1.6 10*3/uL (ref 0.7–4.0)
Lymphocytes Relative: 28 % (ref 12–46)
MCH: 32.1 pg (ref 26.0–34.0)
MCHC: 34.2 g/dL (ref 30.0–36.0)
MCV: 93.8 fL (ref 78.0–100.0)
MONOS PCT: 14 % — AB (ref 3–12)
Monocytes Absolute: 0.7 10*3/uL (ref 0.1–1.0)
Neutro Abs: 3 10*3/uL (ref 1.7–7.7)
Neutrophils Relative %: 54 % (ref 43–77)
Platelets: 260 10*3/uL (ref 150–400)
RBC: 4.7 MIL/uL (ref 4.22–5.81)
RDW: 13.6 % (ref 11.5–15.5)
WBC: 5.5 10*3/uL (ref 4.0–10.5)

## 2013-12-14 LAB — HEPATIC FUNCTION PANEL
ALK PHOS: 59 U/L (ref 39–117)
ALT: 15 U/L (ref 0–53)
AST: 17 U/L (ref 0–37)
Albumin: 4.1 g/dL (ref 3.5–5.2)
BILIRUBIN DIRECT: 0.1 mg/dL (ref 0.0–0.3)
BILIRUBIN TOTAL: 0.4 mg/dL (ref 0.2–1.2)
Indirect Bilirubin: 0.3 mg/dL (ref 0.2–1.2)
Total Protein: 6.4 g/dL (ref 6.0–8.3)

## 2013-12-14 LAB — HEMOGLOBIN A1C
HEMOGLOBIN A1C: 5.8 % — AB (ref <5.7)
Mean Plasma Glucose: 120 mg/dL — ABNORMAL HIGH (ref <117)

## 2013-12-14 LAB — TESTOSTERONE: Testosterone: 516 ng/dL (ref 300–890)

## 2013-12-14 LAB — MICROALBUMIN / CREATININE URINE RATIO
CREATININE, URINE: 226.4 mg/dL
Microalb Creat Ratio: 3.5 mg/g (ref 0.0–30.0)
Microalb, Ur: 0.8 mg/dL (ref 0.00–1.89)

## 2013-12-14 LAB — IRON AND TIBC
%SAT: 23 % (ref 20–55)
Iron: 74 ug/dL (ref 42–165)
TIBC: 318 ug/dL (ref 215–435)
UIBC: 244 ug/dL (ref 125–400)

## 2013-12-14 LAB — BASIC METABOLIC PANEL WITH GFR
BUN: 22 mg/dL (ref 6–23)
CO2: 27 mEq/L (ref 19–32)
Calcium: 9.3 mg/dL (ref 8.4–10.5)
Chloride: 102 mEq/L (ref 96–112)
Creat: 0.83 mg/dL (ref 0.50–1.35)
GFR, Est African American: >89 mL/min
GFR, Est Non African American: >89 mL/min
Glucose, Bld: 75 mg/dL (ref 70–99)
POTASSIUM: 4.2 meq/L (ref 3.5–5.3)
Sodium: 138 mEq/L (ref 135–145)

## 2013-12-14 LAB — HEPATITIS B CORE ANTIBODY, TOTAL: HEP B C TOTAL AB: REACTIVE — AB

## 2013-12-14 LAB — URINALYSIS, MICROSCOPIC ONLY
Bacteria, UA: NONE SEEN
CASTS: NONE SEEN
Crystals: NONE SEEN
Squamous Epithelial / LPF: NONE SEEN

## 2013-12-14 LAB — LIPID PANEL
CHOL/HDL RATIO: 3.4 ratio
Cholesterol: 131 mg/dL (ref 0–200)
HDL: 38 mg/dL — AB (ref >39)
LDL Cholesterol: 70 mg/dL (ref 0–99)
Triglycerides: 116 mg/dL (ref <150)
VLDL: 23 mg/dL (ref 0–40)

## 2013-12-14 LAB — HEPATITIS C ANTIBODY: HCV Ab: NEGATIVE

## 2013-12-14 LAB — RPR

## 2013-12-14 LAB — VITAMIN D 25 HYDROXY (VIT D DEFICIENCY, FRACTURES): Vit D, 25-Hydroxy: 103 ng/mL — ABNORMAL HIGH (ref 30–89)

## 2013-12-14 LAB — HEPATITIS B SURFACE ANTIBODY,QUALITATIVE: HEP B S AB: NEGATIVE

## 2013-12-14 LAB — VITAMIN B12: Vitamin B-12: 255 pg/mL (ref 211–911)

## 2013-12-14 LAB — HIV ANTIBODY (ROUTINE TESTING W REFLEX): HIV: NONREACTIVE

## 2013-12-14 LAB — PSA: PSA: 0.34 ng/mL (ref <=4.00)

## 2013-12-14 LAB — HEPATITIS A ANTIBODY, TOTAL: Hep A Total Ab: NONREACTIVE

## 2013-12-14 LAB — INSULIN, FASTING: Insulin fasting, serum: 9 u[IU]/mL (ref 3–28)

## 2013-12-14 LAB — MAGNESIUM: Magnesium: 1.8 mg/dL (ref 1.5–2.5)

## 2013-12-14 LAB — TSH: TSH: 1.179 u[IU]/mL (ref 0.350–4.500)

## 2013-12-15 LAB — TB SKIN TEST
INDURATION: 0 mm
TB Skin Test: NEGATIVE

## 2013-12-16 LAB — HEPATITIS B E ANTIBODY: HEPATITIS BE ANTIBODY: NEGATIVE

## 2014-01-07 ENCOUNTER — Ambulatory Visit (INDEPENDENT_AMBULATORY_CARE_PROVIDER_SITE_OTHER): Payer: BC Managed Care – PPO | Admitting: Internal Medicine

## 2014-01-07 ENCOUNTER — Encounter: Payer: Self-pay | Admitting: Internal Medicine

## 2014-01-07 VITALS — BP 122/88 | HR 64 | Temp 97.5°F | Resp 16 | Wt 200.0 lb

## 2014-01-07 DIAGNOSIS — L570 Actinic keratosis: Secondary | ICD-10-CM

## 2014-01-07 DIAGNOSIS — J029 Acute pharyngitis, unspecified: Secondary | ICD-10-CM

## 2014-01-07 DIAGNOSIS — J209 Acute bronchitis, unspecified: Secondary | ICD-10-CM

## 2014-01-07 MED ORDER — HYDROCODONE-ACETAMINOPHEN 5-325 MG PO TABS: ORAL_TABLET | ORAL | Status: DC

## 2014-01-07 MED ORDER — AZITHROMYCIN 250 MG PO TABS: ORAL_TABLET | ORAL | Status: AC

## 2014-01-07 MED ORDER — PREDNISONE 20 MG PO TABS: 20.0000 mg | ORAL_TABLET | ORAL | Status: DC

## 2014-01-07 NOTE — Patient Instructions (Signed)
Acute Bronchitis Bronchitis is inflammation of the airways that extend from the windpipe into the lungs (bronchi). The inflammation often causes mucus to develop. This leads to a cough, which is the most common symptom of bronchitis.  In acute bronchitis, the condition usually develops suddenly and goes away over time, usually in a couple weeks. Smoking, allergies, and asthma can make bronchitis worse. Repeated episodes of bronchitis may cause further lung problems.  CAUSES Acute bronchitis is most often caused by the same virus that causes a cold. The virus can spread from person to person (contagious).  SIGNS AND SYMPTOMS   Cough.   Fever.   Coughing up mucus.   Body aches.   Chest congestion.   Chills.   Shortness of breath.   Sore throat.  DIAGNOSIS  Acute bronchitis is usually diagnosed through a physical exam. Tests, such as chest X-rays, are sometimes done to rule out other conditions.  TREATMENT  Acute bronchitis usually goes away in a couple weeks. Often times, no medical treatment is necessary. Medicines are sometimes given for relief of fever or cough. Antibiotics are usually not needed but may be prescribed in certain situations. In some cases, an inhaler may be recommended to help reduce shortness of breath and control the cough. A cool mist vaporizer may also be used to help thin bronchial secretions and make it easier to clear the chest.  HOME CARE INSTRUCTIONS  Get plenty of rest.   Drink enough fluids to keep your urine clear or pale yellow (unless you have a medical condition that requires fluid restriction). Increasing fluids may help thin your secretions and will prevent dehydration.   Only take over-the-counter or prescription medicines as directed by your health care provider.   Avoid smoking and secondhand smoke. Exposure to cigarette smoke or irritating chemicals will make bronchitis worse. If you are a smoker, consider using nicotine gum or skin  patches to help control withdrawal symptoms. Quitting smoking will help your lungs heal faster.   Reduce the chances of another bout of acute bronchitis by washing your hands frequently, avoiding people with cold symptoms, and trying not to touch your hands to your mouth, nose, or eyes.   Follow up with your health care provider as directed.  SEEK MEDICAL CARE IF: Your symptoms do not improve after 1 week of treatment.  SEEK IMMEDIATE MEDICAL CARE IF:  You develop an increased fever or chills.   You have chest pain.   You have severe shortness of breath.  You have bloody sputum.   You develop dehydration.  You develop fainting.  You develop repeated vomiting.  You develop a severe headache. MAKE SURE YOU:   Understand these instructions.  Will watch your condition.  Will get help right away if you are not doing well or get worse. Document Released: 12/05/2004 Document Revised: 06/30/2013 Document Reviewed: 04/20/2013 [employer] Patient Information 2014 [employer].  

## 2014-01-07 NOTE — Progress Notes (Signed)
Subjective:    Patient ID: Steven Bray, male    DOB: 1951/04/19, 63 y.o.   MRN: [id number]  Sinusitis The current episode started in the past 7 days. The problem has been waxing and waning since onset. There has been no fever. The pain is mild. Associated symptoms include congestion, coughing, a hoarse voice, sinus pressure and a sore throat. Pertinent negatives include no diaphoresis, neck pain, shortness of breath, sneezing or swollen glands. Past treatments include nothing.     Medication List       This list is accurate as of: 01/07/14 10:52 AM.  Always use your most recent med list.               aspirin 81 MG tablet  Take 81 mg by mouth daily.     atenolol 100 MG tablet  Commonly known as:  TENORMIN  Take 100 mg by mouth daily.     atorvastatin 80 MG tablet  Commonly known as:  LIPITOR  Take 80 mg by mouth daily. TAKES 1/2     azithromycin 250 MG tablet  Commonly known as:  ZITHROMAX  Take 2 tablets (500 mg) on  Day 1,  followed by 1 tablet (250 mg) once daily on Days 2 through 5.     hydrochlorothiazide 25 MG tablet  Commonly known as:  HYDRODIURIL  Take 25 mg by mouth daily. TAKES 1/2     HYDROcodone-acetaminophen 5-325 MG per tablet  Commonly known as:  NORCO  1/2 to 1 tablet every 3 to 4 hours as needed for cough or pain     lisinopril 40 MG tablet  Commonly known as:  PRINIVIL,ZESTRIL  Take 40 mg by mouth daily. TAKES 1/2     pantoprazole 40 MG tablet  Commonly known as:  PROTONIX  Take 1 tablet (40 mg total) by mouth daily.     predniSONE 20 MG tablet  Commonly known as:  DELTASONE  Take 1 tablet (20 mg total) by mouth See admin instructions. 1 tab 3 x day for 3 days, then 1 tab 2 x day for 3 days, then 1 tab 1 x day for 5 days     sildenafil 100 MG tablet  Commonly known as:  VIAGRA  Take 100 mg by mouth daily as needed for erectile dysfunction.     tamsulosin 0.4 MG Caps capsule  Commonly known as:  FLOMAX  Take 1 capsule (0.4 mg total) by  mouth daily. For prostate     Vitamin D (Ergocalciferol) 50000 UNITS Caps capsule  Commonly known as:  DRISDOL  TAKE 1 CAPSULE DAILY       Allergies  Allergen Reactions  . Sulfa Antibiotics Nausea Only   Past Medical History  Diagnosis Date  . Hypertension   . Hyperlipidemia   . Pre-diabetes   . GERD (gastroesophageal reflux disease)   . Vitamin D deficiency   . TIA (transient ischemic attack)   . Stroke   . ED (erectile dysfunction)    Review of Systems  Constitutional: Negative for fever, diaphoresis and fatigue.  HENT: Positive for congestion, hoarse voice, sinus pressure and sore throat. Negative for sneezing.   Respiratory: Positive for cough. Negative for shortness of breath.   Musculoskeletal: Negative for neck pain.   Filed Vitals:   01/07/14 0959  BP: 122/88  Pulse: 64  Temp: 97.5 F (36.4 C)  Resp: 16    Objective:   Physical Exam  Constitutional: He is oriented to person, place, and  time. No distress.  HENT:  Right Ear: External ear normal.  Left Ear: External ear normal.  Nose: Nose normal.  Mouth/Throat: Oropharynx is clear and moist. No oropharyngeal exudate.  Eyes: EOM are normal. Pupils are equal, round, and reactive to light. Right eye exhibits no discharge. Left eye exhibits no discharge.  Neck: Normal range of motion. Neck supple. No JVD present. No thyromegaly present.  Cardiovascular: Normal rate, regular rhythm, normal heart sounds and intact distal pulses.   No murmur heard. Pulmonary/Chest: No respiratory distress. He has no wheezes. He has rales.  Lymphadenopathy:    He has no cervical adenopathy.  Neurological: He is alert and oriented to person, place, and time.  Skin: No rash noted. He is not diaphoretic. No erythema. No pallor.   Assessment & Plan:  1. Acute bronchitis  - azithromycin (ZITHROMAX) 250 MG tablet; Take 2 tablets (500 mg) on  Day 1,  followed by 1 tablet (250 mg) once daily on Days 2 through 5.  Dispense: 6 each;  Refill: 1 - predniSONE (DELTASONE) 20 MG tablet; Take 1 tablet (20 mg total) by mouth See admin instructions. 1 tab 3 x day for 3 days, then 1 tab 2 x day for 3 days, then 1 tab 1 x day for 5 days  Dispense: 20 tablet; Refill: 0 - HYDROcodone-acetaminophen (NORCO) 5-325 MG per tablet; 1/2 to 1 tablet every 3 to 4 hours as needed for cough or pain  Dispense: 50 tablet; Refill: 0  2. Acute pharyngitis  - azithromycin (ZITHROMAX) 250 MG tablet; Take 2 tablets (500 mg) on  Day 1,  followed by 1 tablet (250 mg) once daily on Days 2 through 5.  Dispense: 6 each; Refill: 1  3. Multiple actinic keratoses  - Ambulatory referral to Dermatology

## 2014-01-15 ENCOUNTER — Other Ambulatory Visit: Payer: Self-pay | Admitting: Internal Medicine

## 2014-01-19 ENCOUNTER — Other Ambulatory Visit: Payer: Self-pay | Admitting: Internal Medicine

## 2014-01-19 DIAGNOSIS — K589 Irritable bowel syndrome without diarrhea: Secondary | ICD-10-CM

## 2014-01-19 MED ORDER — HYOSCYAMINE SULFATE ER 0.375 MG PO TB12: ORAL_TABLET | ORAL | Status: DC

## 2014-01-21 ENCOUNTER — Telehealth: Payer: Self-pay | Admitting: *Deleted

## 2014-01-21 NOTE — Telephone Encounter (Signed)
Patient called. He states he has constipation and RX for Hyoscyamine for cramping and diarrhea. Per Dr Melford Aase, try milk of magnesium or Miralax.  Patient aware.

## 2014-02-25 ENCOUNTER — Other Ambulatory Visit: Payer: Self-pay | Admitting: Internal Medicine

## 2014-03-14 ENCOUNTER — Encounter: Payer: Self-pay | Admitting: Physician Assistant

## 2014-03-14 ENCOUNTER — Ambulatory Visit (INDEPENDENT_AMBULATORY_CARE_PROVIDER_SITE_OTHER): Payer: BC Managed Care – PPO | Admitting: Physician Assistant

## 2014-03-14 VITALS — BP 110/74 | HR 64 | Temp 98.2°F | Resp 16 | Wt 205.0 lb

## 2014-03-14 DIAGNOSIS — R7309 Other abnormal glucose: Secondary | ICD-10-CM

## 2014-03-14 DIAGNOSIS — I10 Essential (primary) hypertension: Secondary | ICD-10-CM

## 2014-03-14 DIAGNOSIS — E559 Vitamin D deficiency, unspecified: Secondary | ICD-10-CM

## 2014-03-14 DIAGNOSIS — E785 Hyperlipidemia, unspecified: Secondary | ICD-10-CM

## 2014-03-14 DIAGNOSIS — Z79899 Other long term (current) drug therapy: Secondary | ICD-10-CM

## 2014-03-14 DIAGNOSIS — E782 Mixed hyperlipidemia: Secondary | ICD-10-CM

## 2014-03-14 DIAGNOSIS — R7303 Prediabetes: Secondary | ICD-10-CM

## 2014-03-14 MED ORDER — TADALAFIL 20 MG PO TABS: 20.0000 mg | ORAL_TABLET | Freq: Every day | ORAL | Status: DC | PRN

## 2014-03-14 NOTE — Progress Notes (Signed)
HPI 63 y.o. male  presents for 3 month follow up with hypertension, hyperlipidemia, prediabetes and vitamin D. His blood pressure has been controlled at home, today their BP is BP: 110/74 mmHg He does not workout, but he is very active at work. He denies chest pain, shortness of breath, dizziness.  He is on cholesterol medication and denies myalgias. His cholesterol is at goal. The cholesterol last visit was:   Lab Results  Component Value Date   CHOL 131 12/13/2013   HDL 38* 12/13/2013   LDLCALC 70 12/13/2013   TRIG 116 12/13/2013   CHOLHDL 3.4 12/13/2013   He has been working on diet and exercise for prediabetes, and denies paresthesia of the feet, polydipsia and polyuria. Last A1C in the office was:  Lab Results  Component Value Date   HGBA1C 5.8* 12/13/2013   Patient is on Vitamin D supplement.    Current Medications:  Current Outpatient Prescriptions on File Prior to Visit  Medication Sig Dispense Refill  . aspirin 81 MG tablet Take 81 mg by mouth daily.      Marland Kitchen atenolol (TENORMIN) 100 MG tablet Take 100 mg by mouth daily.      Marland Kitchen atorvastatin (LIPITOR) 80 MG tablet Take 80 mg by mouth daily. TAKES 1/2      . hydrochlorothiazide (HYDRODIURIL) 25 MG tablet Take 25 mg by mouth daily. TAKES 1/2      . HYDROcodone-acetaminophen (NORCO) 5-325 MG per tablet 1/2 to 1 tablet every 3 to 4 hours as needed for cough or pain  50 tablet  0  . hyoscyamine (LEVBID) 0.375 MG 12 hr tablet 1/2 to 1 tablet 2 x day as needed for cramping , bloating , nausea, or diarrhea  60 tablet  99  . lisinopril (PRINIVIL,ZESTRIL) 40 MG tablet Take 40 mg by mouth daily. TAKES 1/2      . pantoprazole (PROTONIX) 40 MG tablet TAKE 1 TABLET EVERY DAY  30 tablet  3  . tamsulosin (FLOMAX) 0.4 MG CAPS capsule Take 1 capsule (0.4 mg total) by mouth daily. For prostate  30 capsule  99  . VIAGRA 100 MG tablet TAKE 1/2 TO 1 TABLET DAILY AS NEEDED  6 tablet  6  . Vitamin D, Ergocalciferol, (DRISDOL) 50000 UNITS CAPS capsule TAKE 1  CAPSULE DAILY  30 capsule  4   No current facility-administered medications on file prior to visit.   Medical History:  Past Medical History  Diagnosis Date  . Hypertension   . Hyperlipidemia   . Pre-diabetes   . GERD (gastroesophageal reflux disease)   . Vitamin D deficiency   . TIA (transient ischemic attack)   . Stroke   . ED (erectile dysfunction)    Allergies:  Allergies  Allergen Reactions  . Sulfa Antibiotics Nausea Only     Review of Systems: [X]  = complains of  [ ]  = denies  General: Fatigue [ ]  Fever [ ]  Chills [ ]  Weakness [ ]   Insomnia [ ]  Eyes: Redness [ ]  Blurred vision [ ]  Diplopia [ ]   ENT: Congestion [ ]  Sinus Pain [ ]  Post Nasal Drip [ ]  Sore Throat [ ]  Earache [ ]   Cardiac: Chest pain/pressure [ ]  SOB [ ]  Orthopnea [ ]   Palpitations [ ]   Paroxysmal nocturnal dyspnea[ ]  Claudication [ ]  Edema [ ]   Pulmonary: Cough [ ]  Wheezing[ ]   SOB [ ]   Snoring [ ]   GI: Nausea [ ]  Vomiting[ ]  Dysphagia[ ]  Heartburn[ ]  Abdominal pain [ ]   Constipation [ ] ; Diarrhea [ ] ; BRBPR [ ]  Melena[ ]  GU: Hematuria[ ]  Dysuria [ ]  Nocturia[ ]  Urgency [ ]   Hesitancy [ ]  Discharge [ ]  Neuro: Headaches[ ]  Vertigo[ ]  Paresthesias[ ]  Spasm [ ]  Speech changes [ ]  Incoordination [ ]   Ortho: Arthritis [ ]  Joint pain [ ]  Muscle pain [ ]  Joint swelling [ ]  Back Pain [ ]  Skin:  Rash [ ]   Pruritis [ ]  Change in skin lesion [ ]   Psych: Depression[ ]  Anxiety[ ]  Confusion [ ]  Memory loss [ ]   Heme/Lypmh: Bleeding [ ]  Bruising [ ]  Enlarged lymph nodes [ ]   Endocrine: Visual blurring [ ]  Paresthesia [ ]  Polyuria [ ]  Polydypsea [ ]    Heat/cold intolerance [ ]  Hypoglycemia [ ]   Family history- Review and unchanged Social history- Review and unchanged Physical Exam: BP 110/74  Pulse 64  Temp(Src) 98.2 F (36.8 C)  Resp 16  Wt 205 lb (92.987 kg) Wt Readings from Last 3 Encounters:  03/14/14 205 lb (92.987 kg)  01/07/14 200 lb (90.719 kg)  12/13/13 201 lb 12.8 oz (91.536 kg)   General Appearance:  Well nourished, in no apparent distress. Eyes: PERRLA, EOMs, conjunctiva no swelling or erythema Sinuses: No Frontal/maxillary tenderness ENT/Mouth: Ext aud canals clear, TMs without erythema, bulging. No erythema, swelling, or exudate on post pharynx.  Tonsils not swollen or erythematous. Hearing normal.  Neck: Supple, thyroid normal.  Respiratory: Respiratory effort normal, BS equal bilaterally without rales, rhonchi, wheezing or stridor.  Cardio: RRR with no MRGs. Brisk peripheral pulses without edema.  Abdomen: Soft, + BS.  Non tender, no guarding, rebound, hernias, masses. Lymphatics: Non tender without lymphadenopathy.  Musculoskeletal: Full ROM, 5/5 strength, normal gait.  Skin: Warm, dry without rashes, lesions, ecchymosis.  Neuro: Cranial nerves intact. Normal muscle tone, no cerebellar symptoms. Sensation intact.  Psych: Awake and oriented X 3, normal affect, Insight and Judgment appropriate.   Assessment and Plan:  Hypertension: Continue medication, monitor blood pressure at home. Continue DASH diet. Cholesterol: Continue diet and exercise. Check cholesterol.  Pre-diabetes-Continue diet and exercise. Check A1C Vitamin D Def- check level and continue medications.  ED- patient got a prescription of cialis and it did well.   Continue diet and meds as discussed. Further disposition pending results of labs.  Vicie Mutters 4:45 PM

## 2014-03-14 NOTE — Patient Instructions (Signed)
 Bad carbs also include fruit juice, alcohol, and sweet tea. These are empty calories that do not signal to your brain that you are full.   Please remember the good carbs are still carbs which convert into sugar. So please measure them out no more than 1/2-1 cup of rice, oatmeal, pasta, and beans.  Veggies are however free foods! Pile them on.   I like lean protein at every meal such as chicken, turkey, pork chops, cottage cheese, etc. Just do not fry these meats and please center your meal around vegetable, the meats should be a side dish.   No all fruit is created equal. Please see the list below, the fruit at the bottom is higher in sugars than the fruit at the top   Cholesterol Cholesterol is a white, waxy, fat-like protein needed by your body in small amounts. The liver makes all the cholesterol you need. It is carried from the liver by the blood through the blood vessels. Deposits (plaque) may build up on blood vessel walls. This makes the arteries narrower and stiffer. Plaque increases the risk for heart attack and stroke. You cannot feel your cholesterol level even if it is very high. The only way to know is by a blood test to check your lipid (fats) levels. Once you know your cholesterol levels, you should keep a record of the test results. Work with your caregiver to to keep your levels in the desired range. WHAT THE RESULTS MEAN:  Total cholesterol is a rough measure of all the cholesterol in your blood.  LDL is the so-called bad cholesterol. This is the type that deposits cholesterol in the walls of the arteries. You want this level to be low.  HDL is the good cholesterol because it cleans the arteries and carries the LDL away. You want this level to be high.  Triglycerides are fat that the body can either burn for energy or store. High levels are closely linked to heart disease. DESIRED LEVELS:  Total cholesterol below 200.  LDL below 100 for people at risk, below 70 for very  high risk.  HDL above 50 is good, above 60 is best.  Triglycerides below 150. HOW TO LOWER YOUR CHOLESTEROL:  Diet.  Choose fish or white meat chicken and turkey, roasted or baked. Limit fatty cuts of red meat, fried foods, and processed meats, such as sausage and lunch meat.  Eat lots of fresh fruits and vegetables. Choose whole grains, beans, pasta, potatoes and cereals.  Use only small amounts of olive, corn or canola oils. Avoid butter, mayonnaise, shortening or palm kernel oils. Avoid foods with trans-fats.  Use skim/nonfat milk and low-fat/nonfat yogurt and cheeses. Avoid whole milk, cream, ice cream, egg yolks and cheeses. Healthy desserts include angel food cake, ginger snaps, animal crackers, hard candy, popsicles, and low-fat/nonfat frozen yogurt. Avoid pastries, cakes, pies and cookies.  Exercise.  A regular program helps decrease LDL and raises HDL.  Helps with weight control.  Do things that increase your activity level like gardening, walking, or taking the stairs.  Medication.  May be prescribed by your caregiver to help lowering cholesterol and the risk for heart disease.  You may need medicine even if your levels are normal if you have several risk factors. HOME CARE INSTRUCTIONS   Follow your diet and exercise programs as suggested by your caregiver.  Take medications as directed.  Have blood work done when your caregiver feels it is necessary. MAKE SURE YOU:   Understand   these instructions.  Will watch your condition.  Will get help right away if you are not doing well or get worse. Document Released: 07/23/2001 Document Revised: 01/20/2012 Document Reviewed: 08/11/2013 [employer] Patient Information 2014 [employer].  

## 2014-03-15 LAB — BASIC METABOLIC PANEL WITH GFR
BUN: 17 mg/dL (ref 6–23)
CHLORIDE: 107 meq/L (ref 96–112)
CO2: 26 mEq/L (ref 19–32)
Calcium: 9.8 mg/dL (ref 8.4–10.5)
Creat: 0.9 mg/dL (ref 0.50–1.35)
GFR, Est African American: >89 mL/min
GFR, Est Non African American: >89 mL/min
GLUCOSE: 80 mg/dL (ref 70–99)
POTASSIUM: 4.3 meq/L (ref 3.5–5.3)
SODIUM: 142 meq/L (ref 135–145)

## 2014-03-15 LAB — LIPID PANEL
CHOL/HDL RATIO: 4.3 ratio
CHOLESTEROL: 165 mg/dL (ref 0–200)
HDL: 38 mg/dL — AB (ref >39)
LDL Cholesterol: 71 mg/dL (ref 0–99)
Triglycerides: 281 mg/dL — ABNORMAL HIGH (ref <150)
VLDL: 56 mg/dL — ABNORMAL HIGH (ref 0–40)

## 2014-03-15 LAB — HEMOGLOBIN A1C
HEMOGLOBIN A1C: 5.8 % — AB (ref <5.7)
Mean Plasma Glucose: 120 mg/dL — ABNORMAL HIGH (ref <117)

## 2014-03-15 LAB — CBC WITH DIFFERENTIAL/PLATELET
Basophils Absolute: 0 10*3/uL (ref 0.0–0.1)
Basophils Relative: 0 % (ref 0–1)
Eosinophils Absolute: 0.2 10*3/uL (ref 0.0–0.7)
Eosinophils Relative: 3 % (ref 0–5)
HEMATOCRIT: 43.5 % (ref 39.0–52.0)
HEMOGLOBIN: 15.1 g/dL (ref 13.0–17.0)
LYMPHS PCT: 26 % (ref 12–46)
Lymphs Abs: 1.4 10*3/uL (ref 0.7–4.0)
MCH: 32 pg (ref 26.0–34.0)
MCHC: 34.7 g/dL (ref 30.0–36.0)
MCV: 92.2 fL (ref 78.0–100.0)
MONO ABS: 0.6 10*3/uL (ref 0.1–1.0)
MONOS PCT: 11 % (ref 3–12)
NEUTROS ABS: 3.2 10*3/uL (ref 1.7–7.7)
NEUTROS PCT: 60 % (ref 43–77)
Platelets: 259 10*3/uL (ref 150–400)
RBC: 4.72 MIL/uL (ref 4.22–5.81)
RDW: 13.6 % (ref 11.5–15.5)
WBC: 5.3 10*3/uL (ref 4.0–10.5)

## 2014-03-15 LAB — VITAMIN D 25 HYDROXY (VIT D DEFICIENCY, FRACTURES): VIT D 25 HYDROXY: 106 ng/mL — AB (ref 30–89)

## 2014-03-15 LAB — HEPATIC FUNCTION PANEL
ALK PHOS: 57 U/L (ref 39–117)
ALT: 16 U/L (ref 0–53)
AST: 20 U/L (ref 0–37)
Albumin: 4.3 g/dL (ref 3.5–5.2)
BILIRUBIN DIRECT: 0.1 mg/dL (ref 0.0–0.3)
Indirect Bilirubin: 0.3 mg/dL (ref 0.2–1.2)
TOTAL PROTEIN: 6.7 g/dL (ref 6.0–8.3)
Total Bilirubin: 0.4 mg/dL (ref 0.2–1.2)

## 2014-03-15 LAB — MAGNESIUM: MAGNESIUM: 2 mg/dL (ref 1.5–2.5)

## 2014-03-15 LAB — TSH: TSH: 1.675 u[IU]/mL (ref 0.350–4.500)

## 2014-03-15 LAB — INSULIN, FASTING: Insulin fasting, serum: 14 u[IU]/mL (ref 3–28)

## 2014-05-16 ENCOUNTER — Encounter: Payer: Self-pay | Admitting: Internal Medicine

## 2014-05-16 ENCOUNTER — Ambulatory Visit (INDEPENDENT_AMBULATORY_CARE_PROVIDER_SITE_OTHER): Payer: BC Managed Care – PPO | Admitting: Internal Medicine

## 2014-05-16 VITALS — BP 110/74 | HR 60 | Temp 97.9°F | Resp 16 | Ht 70.5 in | Wt 198.8 lb

## 2014-05-16 DIAGNOSIS — J45909 Unspecified asthma, uncomplicated: Secondary | ICD-10-CM

## 2014-05-16 DIAGNOSIS — J209 Acute bronchitis, unspecified: Secondary | ICD-10-CM

## 2014-05-16 MED ORDER — AZITHROMYCIN 250 MG PO TABS: ORAL_TABLET | ORAL | Status: DC

## 2014-05-16 MED ORDER — PREDNISONE 20 MG PO TABS: ORAL_TABLET | ORAL | Status: DC

## 2014-05-16 MED ORDER — HYDROCODONE-ACETAMINOPHEN 5-325 MG PO TABS: ORAL_TABLET | ORAL | Status: AC

## 2014-05-16 NOTE — Progress Notes (Signed)
  Subjective:    Patient ID: Steven Bray, male    DOB: 1951/09/18, 63 y.o.   MRN: [id number]  Cough This is a new problem. The current episode started in the past 7 days. The problem has been waxing and waning. The problem occurs every few minutes. The cough is productive of sputum. Associated symptoms include ear congestion, a fever, headaches, nasal congestion and rhinorrhea. Pertinent negatives include no chest pain, chills, ear pain, heartburn, myalgias, postnasal drip, rash, sore throat, shortness of breath, sweats, weight loss or wheezing. The treatment provided no relief. His past medical history is significant for asthma.  Dizziness Associated symptoms include coughing, a fever and headaches. Pertinent negatives include no chest pain, chills, diaphoresis, fatigue, myalgias, numbness, rash or sore throat.   Review of Systems  Constitutional: Positive for fever. Negative for chills, weight loss, diaphoresis and fatigue.  HENT: Positive for dental problem, rhinorrhea and sinus pressure. Negative for drooling, ear discharge, ear pain, facial swelling, hearing loss, mouth sores, nosebleeds, postnasal drip, sneezing, sore throat, tinnitus, trouble swallowing and voice change.   Eyes: Negative.   Respiratory: Positive for cough. Negative for choking, chest tightness, shortness of breath, wheezing and stridor.   Cardiovascular: Negative.  Negative for chest pain.  Gastrointestinal: Negative.  Negative for heartburn and abdominal distention.  Genitourinary: Negative.   Musculoskeletal: Negative for myalgias.  Skin: Negative for rash.  Allergic/Immunologic: Negative.   Neurological: Positive for dizziness, light-headedness and headaches. Negative for tremors, seizures, syncope, facial asymmetry, speech difficulty and numbness.   Objective:   Physical Exam BP 110/74  P 60  T97.9 F   R 16  Ht 5' 10.5"   Wt 198 lb 12.8 oz   BMI 28.11 kg/m2  HEENT - Eac's patent. TM's Sl Retracted Lt>Rt.  Sl maxillary & frontal sinus tenderness. EOM's full. PERRLA. NasoOroPharynx 1-2 (+) red w/o exudate. Neck - supple. Nl Thyroid. Carotids 2+ & No bruits, nodes, JVD Chest - Clear equal BS w/scattered Rales, rhonchi & bilat wheezes. O2 sat =97%. Cor - Nl HS. RRR w/o sig MGR. PP 1(+). No edema. Abd - No palpable organomegaly, masses or tenderness. BS nl. MS- FROM w/o deformities. Muscle power, tone and bulk Nl. Gait Nl. Neuro - No obvious Cr N abnormalities. Sensory, motor and Cerebellar functions appear Nl w/o focal abnormalities. Psyche - Mental status normal & appropriate.  No delusions, ideations or obvious mood abnormalities.  Assessment & Plan:   1. Acute bronchitis, unspecified organism  - azithromycin (ZITHROMAX) 250 MG tablet; Take 2 tablets (500 mg) on  Day 1,  followed by 1 tablet (250 mg) once daily on Days 2 through 5.  Dispense: 6 each; Refill: 1 - predniSONE (DELTASONE) 20 MG tablet; 1 tab 3 x day for 3 days, then 1 tab 2 x day for 3 days, then 1 tab 1 x day for 5 days  Dispense: 20 tablet; Refill: 0  2. Intrinsic asthma, unspecified  - Sx Anora  X 7 days  - ROV prn

## 2014-05-16 NOTE — Patient Instructions (Signed)
Bronchitis Bronchitis is inflammation of the airways that extend from the windpipe into the lungs (bronchi). The inflammation often causes mucus to develop, which leads to a cough. If the inflammation becomes severe, it may cause shortness of breath. CAUSES  Bronchitis may be caused by:   Viral infections.   Bacteria.   Cigarette smoke.   Allergens, pollutants, and other irritants.  SIGNS AND SYMPTOMS  The most common symptom of bronchitis is a frequent cough that produces mucus. Other symptoms include:  Fever.   Body aches.   Chest congestion.   Chills.   Shortness of breath.   Sore throat.  DIAGNOSIS  Bronchitis is usually diagnosed through a medical history and physical exam. Tests, such as chest X-rays, are sometimes done to rule out other conditions.  TREATMENT  You may need to avoid contact with whatever caused the problem (smoking, for example). Medicines are sometimes needed. These may include:  Antibiotics. These may be prescribed if the condition is caused by bacteria.  Cough suppressants. These may be prescribed for relief of cough symptoms.   Inhaled medicines. These may be prescribed to help open your airways and make it easier for you to breathe.   Steroid medicines. These may be prescribed for those with recurrent (chronic) bronchitis. HOME CARE INSTRUCTIONS  Get plenty of rest.   Drink enough fluids to keep your urine clear or pale yellow (unless you have a medical condition that requires fluid restriction). Increasing fluids may help thin your secretions and will prevent dehydration.   Only take over-the-counter or prescription medicines as directed by your health care provider.  Only take antibiotics as directed. Make sure you finish them even if you start to feel better.  Avoid secondhand smoke, irritating chemicals, and strong fumes. These will make bronchitis worse. If you are a smoker, quit smoking. Consider using nicotine gum or  skin patches to help control withdrawal symptoms. Quitting smoking will help your lungs heal faster.   Put a cool-mist humidifier in your bedroom at night to moisten the air. This may help loosen mucus. Change the water in the humidifier daily. You can also run the hot water in your shower and sit in the bathroom with the door closed for 5-10 minutes.   Follow up with your health care provider as directed.   Wash your hands frequently to avoid catching bronchitis again or spreading an infection to others.  SEEK MEDICAL CARE IF: Your symptoms do not improve after 1 week of treatment.  SEEK IMMEDIATE MEDICAL CARE IF:  Your fever increases.  You have chills.   You have chest pain.   You have worsening shortness of breath.   You have bloody sputum.  You faint.  You have lightheadedness.  You have a severe headache.   You vomit repeatedly. MAKE SURE YOU:   Understand these instructions.  Will watch your condition.  Will get help right away if you are not doing well or get worse. Document Released: 10/28/2005 Document Revised: 08/18/2013 Document Reviewed: 06/22/2013 [facility] Patient Information 2015 [place], [place]. This information is not intended to replace advice given to you by your health care provider. Make sure you discuss any questions you have with your health care provider.   Asthma Asthma is a recurring condition in which the airways tighten and narrow. Asthma can make it difficult to breathe. It can cause coughing, wheezing, and shortness of breath. Asthma episodes, also called asthma attacks, range from minor to life-threatening. Asthma cannot be cured, but medicines and  lifestyle changes can help control it. CAUSES Asthma is believed to be caused by inherited (genetic) and environmental factors, but its exact cause is unknown. Asthma may be triggered by allergens, lung infections, or irritants in the air. Asthma triggers are different for each person.  Common triggers include:   Animal dander.  Dust mites.  Cockroaches.  Pollen from trees or grass.  Mold.  Smoke.  Air pollutants such as dust, household cleaners, hair sprays, aerosol sprays, paint fumes, strong chemicals, or strong odors.  Cold air, weather changes, and winds (which increase molds and pollens in the air).  Strong emotional expressions such as crying or laughing hard.  Stress.  Certain medicines (such as aspirin) or types of drugs (such as beta-blockers).  Sulfites in foods and drinks. Foods and drinks that may contain sulfites include dried fruit, potato chips, and sparkling grape juice.  Infections or inflammatory conditions such as the flu, a cold, or an inflammation of the nasal membranes (rhinitis).  Gastroesophageal reflux disease (GERD).  Exercise or strenuous activity. SYMPTOMS Symptoms may occur immediately after asthma is triggered or many hours later. Symptoms include:  Wheezing.  Excessive nighttime or early morning coughing.  Frequent or severe coughing with a common cold.  Chest tightness.  Shortness of breath. DIAGNOSIS  The diagnosis of asthma is made by a review of your medical history and a physical exam. Tests may also be performed. These may include:  Lung function studies. These tests show how much air you breathe in and out.  Allergy tests.  Imaging tests such as X-rays. TREATMENT  Asthma cannot be cured, but it can usually be controlled. Treatment involves identifying and avoiding your asthma triggers. It also involves medicines. There are 2 classes of medicine used for asthma treatment:   Controller medicines. These prevent asthma symptoms from occurring. They are usually taken every day.  Reliever or rescue medicines. These quickly relieve asthma symptoms. They are used as needed and provide short-term relief. Your health care provider will help you create an asthma action plan. An asthma action plan is a written plan  for managing and treating your asthma attacks. It includes a list of your asthma triggers and how they may be avoided. It also includes information on when medicines should be taken and when their dosage should be changed. An action plan may also involve the use of a device called a peak flow meter. A peak flow meter measures how well the lungs are working. It helps you monitor your condition. HOME CARE INSTRUCTIONS   Take medicine as directed by your health care provider. Speak with your health care provider if you have questions about how or when to take the medicines.  Use a peak flow meter as directed by your health care provider. Record and keep track of readings.  Understand and use the action plan to help minimize or stop an asthma attack without needing to seek medical care.  Control your home environment in the following ways to help prevent asthma attacks:  Do not smoke. Avoid being exposed to secondhand smoke.  Change your heating and air conditioning filter regularly.  Limit your use of fireplaces and wood stoves.  Get rid of pests (such as roaches and mice) and their droppings.  Throw away plants if you see mold on them.  Clean your floors and dust regularly. Use unscented cleaning products.  Try to have someone else vacuum for you regularly. Stay out of rooms while they are being vacuumed and for a  short while afterward. If you vacuum, use a dust mask from a hardware store, a double-layered or microfilter vacuum cleaner bag, or a vacuum cleaner with a HEPA filter.  Replace carpet with wood, tile, or vinyl flooring. Carpet can trap dander and dust.  Use allergy-proof pillows, mattress covers, and box spring covers.  Wash bed sheets and blankets every week in hot water and dry them in a dryer.  Use blankets that are made of polyester or cotton.  Clean bathrooms and kitchens with bleach. If possible, have someone repaint the walls in these rooms with mold-resistant paint.  Keep out of the rooms that are being cleaned and painted.  Wash hands frequently. SEEK MEDICAL CARE IF:   You have wheezing, shortness of breath, or a cough even if taking medicine to prevent attacks.  The colored mucus you cough up (sputum) is thicker than usual.  Your sputum changes from clear or white to yellow, green, gray, or bloody.  You have any problems that may be related to the medicines you are taking (such as a rash, itching, swelling, or trouble breathing).  You are using a reliever medicine more than 2-3 times per week.  Your peak flow is still at 50-79% of your personal best after following your action plan for 1 hour. SEEK IMMEDIATE MEDICAL CARE IF:   You seem to be getting worse and are unresponsive to treatment during an asthma attack.  You are short of breath even at rest.  You get short of breath when doing very little physical activity.  You have difficulty eating, drinking, or talking due to asthma symptoms.  You develop chest pain.  You develop a fast heartbeat.  You have a bluish color to your lips or fingernails.  You are lightheaded, dizzy, or faint.  Your peak flow is less than 50% of your personal best.  You have a fever or persistent symptoms for more than 2-3 days.  You have a fever and symptoms suddenly get worse. MAKE SURE YOU:   Understand these instructions.  Will watch your condition.  Will get help right away if you are not doing well or get worse. Document Released: 10/28/2005 Document Revised: 11/02/2013 Document Reviewed: 05/27/2013 [facility] Patient Information 2015 [place], [place]. This information is not intended to replace advice given to you by your health care provider. Make sure you discuss any questions you have with your health care provider.

## 2014-05-19 ENCOUNTER — Other Ambulatory Visit: Payer: Self-pay | Admitting: Internal Medicine

## 2014-07-14 ENCOUNTER — Ambulatory Visit (INDEPENDENT_AMBULATORY_CARE_PROVIDER_SITE_OTHER): Payer: BC Managed Care – PPO | Admitting: Emergency Medicine

## 2014-07-14 ENCOUNTER — Ambulatory Visit: Payer: Self-pay | Admitting: Internal Medicine

## 2014-07-14 ENCOUNTER — Encounter: Payer: Self-pay | Admitting: Emergency Medicine

## 2014-07-14 VITALS — BP 132/80 | HR 62 | Temp 98.0°F | Resp 18 | Ht 70.25 in | Wt 204.0 lb

## 2014-07-14 DIAGNOSIS — E782 Mixed hyperlipidemia: Secondary | ICD-10-CM

## 2014-07-14 DIAGNOSIS — R7309 Other abnormal glucose: Secondary | ICD-10-CM

## 2014-07-14 DIAGNOSIS — I10 Essential (primary) hypertension: Secondary | ICD-10-CM

## 2014-07-14 NOTE — Patient Instructions (Addendum)
Decrease Atenolol 100 mg to 1/2 tab daily to see if helps with dizzy feeling. Hypotension As your heart beats, it forces blood through your body. This force is called blood pressure. If you have hypotension, you have low blood pressure. When your blood pressure is too low, you may not get enough blood to your brain. You may feel weak, feel lightheaded, have a fast heartbeat, or even pass out (faint). HOME CARE  Drink enough fluids to keep your pee (urine) clear or pale yellow.  Take all medicines as told by your doctor.  Get up slowly after sitting or lying down.  Wear support stockings as told by your doctor.  Maintain a healthy diet by including foods such as fruits, vegetables, nuts, whole grains, and lean meats. GET HELP IF:  You are throwing up (vomiting) or have watery poop (diarrhea).  You have a fever for more than 2-3 days.  You feel more thirsty than usual.  You feel weak and tired. GET HELP RIGHT AWAY IF:   You pass out (faint).  You have chest pain or a fast or irregular heartbeat.  You lose feeling in part of your body.  You cannot move your arms or legs.  You have trouble speaking.  You get sweaty or feel lightheaded. MAKE SURE YOU:   Understand these instructions.  Will watch your condition.  Will get help right away if you are not doing well or get worse. Document Released: 01/22/2010 Document Revised: 06/30/2013 Document Reviewed: 04/30/2013 [facility] Patient Information 2015 [place], [place]. This information is not intended to replace advice given to you by your health care provider. Make sure you discuss any questions you have with your health care provider.   Near-Syncope Near-syncope (commonly known as near fainting) is sudden weakness, dizziness, or feeling like you might pass out. This can happen when getting up or while standing for a long time. It is caused by a sudden decrease in blood flow to the brain, which can occur for various reasons.  Most of the reasons are not serious.  HOME CARE Watch your condition for any changes.  Have someone stay with you until you feel stable.  If you feel like you are going to pass out:  Lie down right away.  Prop your feet up if you can.  Breathe deeply and steadily.  Move only when the feeling has gone away. Most of the time, this feeling lasts only a few minutes. You may feel tired for several hours.  Drink enough fluids to keep your pee (urine) clear or pale yellow.  If you are taking blood pressure or heart medicine, stand up slowly.  Follow up with your doctor as told. GET HELP RIGHT AWAY IF:   You have a severe headache.  You have unusual pain in the chest, belly (abdomen), or back.  You have bleeding from the mouth or butt (rectum), or you have black or tarry poop (stool).  You feel your heart beat differently than normal, or you have a very fast pulse.  You pass out, or you twitch and shake when you pass out.  You pass out when sitting or lying down.  You feel confused.  You have trouble walking.  You are weak.  You have vision problems. MAKE SURE YOU:   Understand these instructions.  Will watch your condition.  Will get help right away if you are not doing well or get worse. Document Released: 04/15/2008 Document Revised: 11/02/2013 Document Reviewed: 04/02/2013 [employer] Patient Information 2015  [employer]. This information is not intended to replace advice given to you by your health care provider. Make sure you discuss any questions you have with your health care provider.   Smoking Cessation, Tips for Success If you are ready to quit smoking, congratulations! You have chosen to help yourself be healthier. Cigarettes bring nicotine, tar, carbon monoxide, and other irritants into your body. Your lungs, heart, and blood vessels will be able to work better without these poisons. There are many different ways to quit smoking. Nicotine gum, nicotine  patches, a nicotine inhaler, or nicotine nasal spray can help with physical craving. Hypnosis, support groups, and medicines help break the habit of smoking. WHAT THINGS CAN I DO TO MAKE QUITTING EASIER?  Here are some tips to help you quit for good:  Pick a date when you will quit smoking completely. Tell all of your friends and family about your plan to quit on that date.  Do not try to slowly cut down on the number of cigarettes you are smoking. Pick a quit date and quit smoking completely starting on that day.  Throw away all cigarettes.   Clean and remove all ashtrays from your home, work, and car.  On a card, write down your reasons for quitting. Carry the card with you and read it when you get the urge to smoke.  Cleanse your body of nicotine. Drink enough water and fluids to keep your urine clear or pale yellow. Do this after quitting to flush the nicotine from your body.  Learn to predict your moods. Do not let a bad situation be your excuse to have a cigarette. Some situations in your life might tempt you into wanting a cigarette.  Never have "just one" cigarette. It leads to wanting another and another. Remind yourself of your decision to quit.  Change habits associated with smoking. If you smoked while driving or when feeling stressed, try other activities to replace smoking. Stand up when drinking your coffee. Brush your teeth after eating. Sit in a different chair when you read the paper. Avoid alcohol while trying to quit, and try to drink fewer caffeinated beverages. Alcohol and caffeine may urge you to smoke.  Avoid foods and drinks that can trigger a desire to smoke, such as sugary or spicy foods and alcohol.  Ask people who smoke not to smoke around you.  Have something planned to do right after eating or having a cup of coffee. For example, plan to take a walk or exercise.  Try a relaxation exercise to calm you down and decrease your stress. Remember, you may be tense  and nervous for the first 2 weeks after you quit, but this will pass.  Find new activities to keep your hands busy. Play with a pen, coin, or rubber band. Doodle or draw things on paper.  Brush your teeth right after eating. This will help cut down on the craving for the taste of tobacco after meals. You can also try mouthwash.   Use oral substitutes in place of cigarettes. Try using lemon drops, carrots, cinnamon sticks, or chewing gum. Keep them handy so they are available when you have the urge to smoke.  When you have the urge to smoke, try deep breathing.  Designate your home as a nonsmoking area.  If you are a heavy smoker, ask your health care provider about a prescription for nicotine chewing gum. It can ease your withdrawal from nicotine.  Reward yourself. Set aside the cigarette money  you save and buy yourself something nice.  Look for support from others. Join a support group or smoking cessation program. Ask someone at home or at work to help you with your plan to quit smoking.  Always ask yourself, "Do I need this cigarette or is this just a reflex?" Tell yourself, "Today, I choose not to smoke," or "I do not want to smoke." You are reminding yourself of your decision to quit.  Do not replace cigarette smoking with electronic cigarettes (commonly called e-cigarettes). The safety of e-cigarettes is unknown, and some may contain harmful chemicals.  If you relapse, do not give up! Plan ahead and think about what you will do the next time you get the urge to smoke. HOW WILL I FEEL WHEN I QUIT SMOKING? You may have symptoms of withdrawal because your body is used to nicotine (the addictive substance in cigarettes). You may crave cigarettes, be irritable, feel very hungry, cough often, get headaches, or have difficulty concentrating. The withdrawal symptoms are only temporary. They are strongest when you first quit but will go away within 10-14 days. When withdrawal symptoms occur,  stay in control. Think about your reasons for quitting. Remind yourself that these are signs that your body is healing and getting used to being without cigarettes. Remember that withdrawal symptoms are easier to treat than the major diseases that smoking can cause.  Even after the withdrawal is over, expect periodic urges to smoke. However, these cravings are generally short lived and will go away whether you smoke or not. Do not smoke! WHAT RESOURCES ARE AVAILABLE TO HELP ME QUIT SMOKING? Your health care provider can direct you to community resources or hospitals for support, which may include:  Group support.  Education.  Hypnosis.  Therapy. Document Released: 07/26/2004 Document Revised: 03/14/2014 Document Reviewed: 04/15/2013 [facility] Patient Information 2015 [place], [place]. This information is not intended to replace advice given to you by your health care provider. Make sure you discuss any questions you have with your health care provider.

## 2014-07-14 NOTE — Progress Notes (Signed)
Subjective:    Patient ID: Steven Bray, male    DOB: [birth year], 63 y.o.   MRN: [id number]  HPI Comments: 63 yo WM presents for 3 month F/U for HTN, Cholesterol, Pre-Dm, D. Deficient. He notes he exercises with work and yard. He is eating healthy except for his diet mountain dew. He notes BP good but occasionally feels dizzy when he bends over.    WBC             5.3   03/14/2014 HGB            15.1   03/14/2014 HCT            43.5   03/14/2014 PLT             259   03/14/2014 GLUCOSE          80   03/14/2014 CHOL            165   03/14/2014 TRIG            281   03/14/2014 HDL              38   03/14/2014 LDLCALC          71   03/14/2014 ALT              16   03/14/2014 AST              20   03/14/2014 NA              142   03/14/2014 K               4.3   03/14/2014 CL              107   03/14/2014 CREATININE     0.90   03/14/2014 BUN              17   03/14/2014 CO2              26   03/14/2014 TSH           1.675   03/14/2014 PSA            0.34   12/13/2013 HGBA1C          5.8   03/14/2014 MICROALBUR     0.80   12/13/2013      Hyperlipidemia  Hypertension  Gastrophageal Reflux     Medication List       This list is accurate as of: 07/14/14  4:18 PM.  Always use your most recent med list.               aspirin 81 MG tablet  Take 81 mg by mouth daily.     atenolol 100 MG tablet  Commonly known as:  TENORMIN  Take 100 mg by mouth daily.     atorvastatin 80 MG tablet  Commonly known as:  LIPITOR  Take 80 mg by mouth daily. TAKES 1/2     hydrochlorothiazide 25 MG tablet  Commonly known as:  HYDRODIURIL  TAKE 1/2 TO 1 TABLET EVERY MORNING AS NEEDED FOR BLOOD PRESSURE     lisinopril 40 MG tablet  Commonly known as:  PRINIVIL,ZESTRIL  Take 40 mg by mouth daily. TAKES 1/2     pantoprazole 40 MG tablet  Commonly known as:  PROTONIX  TAKE 1 TABLET EVERY DAY     tadalafil 20 MG tablet  Commonly known as:  CIALIS  Take 1 tablet (20 mg total) by mouth daily as needed for erectile  dysfunction.     tamsulosin 0.4 MG Caps capsule  Commonly known as:  FLOMAX  Take 1 capsule (0.4 mg total) by mouth daily. For prostate     VIAGRA 100 MG tablet  Generic drug:  sildenafil  TAKE 1/2 TO 1 TABLET DAILY AS NEEDED     Vitamin D (Ergocalciferol) 50000 UNITS Caps capsule  Commonly known as:  DRISDOL  TAKE 1 CAPSULE DAILY       Allergies  Allergen Reactions  . Sulfa Antibiotics Nausea Only   Past Medical History  Diagnosis Date  . Hypertension   . Hyperlipidemia   . Pre-diabetes   . GERD (gastroesophageal reflux disease)   . Vitamin D deficiency   . TIA (transient ischemic attack)   . Stroke   . ED (erectile dysfunction)      Review of Systems  Neurological: Positive for dizziness.  All other systems reviewed and are negative.  BP 132/80  Pulse 62  Temp(Src) 98 F (36.7 C) (Temporal)  Resp 18  Ht 5' 10.25" (1.784 m)  Wt 204 lb (92.534 kg)  BMI 29.07 kg/m2     Objective:   Physical Exam  Nursing note and vitals reviewed. Constitutional: He is oriented to person, place, and time. He appears well-developed and well-nourished.  HENT:  Head: Normocephalic and atraumatic.  Right Ear: External ear normal.  Left Ear: External ear normal.  Nose: Nose normal.  Mouth/Throat: Oropharynx is clear and moist.  Eyes: Conjunctivae and EOM are normal.  Neck: Normal range of motion. Neck supple. No JVD present. No thyromegaly present.  Cardiovascular: Normal rate, regular rhythm, normal heart sounds and intact distal pulses.   No obvious orthostatic problem at exam  Pulmonary/Chest: Effort normal and breath sounds normal.  Abdominal: Soft. Bowel sounds are normal. He exhibits no distension. There is no tenderness.  Musculoskeletal: Normal range of motion. He exhibits no edema and no tenderness.  Lymphadenopathy:    He has no cervical adenopathy.  Neurological: He is alert and oriented to person, place, and time. No cranial nerve deficit. Coordination normal.   Skin: Skin is warm and dry.  Psychiatric: He has a normal mood and affect. His behavior is normal. Judgment and thought content normal.        Assessment & Plan:  1.  3 month F/U for HTN, Cholesterol, Pre-Dm, D. Deficient. Needs healthy diet, cardio QD and obtain healthy weight. Check Labs, Check BP if >130/80 call office   2. ? Hypotension- advised decrease Atenolol to 1/2 tab and call with results, increase H2o, w/c if SX increase or ER.

## 2014-07-15 LAB — HEPATIC FUNCTION PANEL
ALBUMIN: 4.3 g/dL (ref 3.5–5.2)
ALK PHOS: 59 U/L (ref 39–117)
ALT: 14 U/L (ref 0–53)
AST: 16 U/L (ref 0–37)
Bilirubin, Direct: 0.2 mg/dL (ref 0.0–0.3)
Indirect Bilirubin: 0.5 mg/dL (ref 0.2–1.2)
Total Bilirubin: 0.7 mg/dL (ref 0.2–1.2)
Total Protein: 6.7 g/dL (ref 6.0–8.3)

## 2014-07-15 LAB — BASIC METABOLIC PANEL WITH GFR
BUN: 24 mg/dL — ABNORMAL HIGH (ref 6–23)
CALCIUM: 9.6 mg/dL (ref 8.4–10.5)
CHLORIDE: 106 meq/L (ref 96–112)
CO2: 29 meq/L (ref 19–32)
Creat: 0.87 mg/dL (ref 0.50–1.35)
GFR, Est African American: >89 mL/min
GFR, Est Non African American: >89 mL/min
Glucose, Bld: 77 mg/dL (ref 70–99)
POTASSIUM: 4 meq/L (ref 3.5–5.3)
SODIUM: 142 meq/L (ref 135–145)

## 2014-07-15 LAB — CBC WITH DIFFERENTIAL/PLATELET
Basophils Absolute: 0.1 10*3/uL (ref 0.0–0.1)
Basophils Relative: 1 % (ref 0–1)
Eosinophils Absolute: 0.2 10*3/uL (ref 0.0–0.7)
Eosinophils Relative: 3 % (ref 0–5)
HCT: 43.4 % (ref 39.0–52.0)
HEMOGLOBIN: 14.8 g/dL (ref 13.0–17.0)
LYMPHS PCT: 26 % (ref 12–46)
Lymphs Abs: 1.5 10*3/uL (ref 0.7–4.0)
MCH: 32.4 pg (ref 26.0–34.0)
MCHC: 34.1 g/dL (ref 30.0–36.0)
MCV: 95 fL (ref 78.0–100.0)
MONO ABS: 0.7 10*3/uL (ref 0.1–1.0)
MONOS PCT: 12 % (ref 3–12)
NEUTROS ABS: 3.4 10*3/uL (ref 1.7–7.7)
Neutrophils Relative %: 58 % (ref 43–77)
Platelets: 246 10*3/uL (ref 150–400)
RBC: 4.57 MIL/uL (ref 4.22–5.81)
RDW: 13.8 % (ref 11.5–15.5)
WBC: 5.9 10*3/uL (ref 4.0–10.5)

## 2014-07-15 LAB — INSULIN, FASTING: INSULIN FASTING, SERUM: 2.8 u[IU]/mL (ref 2.0–19.6)

## 2014-07-15 LAB — LIPID PANEL
Cholesterol: 125 mg/dL (ref 0–200)
HDL: 38 mg/dL — AB (ref >39)
LDL Cholesterol: 64 mg/dL (ref 0–99)
Total CHOL/HDL Ratio: 3.3 Ratio
Triglycerides: 114 mg/dL (ref <150)
VLDL: 23 mg/dL (ref 0–40)

## 2014-07-15 LAB — HEMOGLOBIN A1C
Hgb A1c MFr Bld: 5.9 % — ABNORMAL HIGH (ref <5.7)
MEAN PLASMA GLUCOSE: 123 mg/dL — AB (ref <117)

## 2014-09-12 ENCOUNTER — Other Ambulatory Visit: Payer: Self-pay | Admitting: Internal Medicine

## 2014-09-18 ENCOUNTER — Other Ambulatory Visit: Payer: Self-pay | Admitting: Internal Medicine

## 2014-09-19 ENCOUNTER — Other Ambulatory Visit: Payer: Self-pay | Admitting: *Deleted

## 2014-09-19 DIAGNOSIS — N320 Bladder-neck obstruction: Secondary | ICD-10-CM

## 2014-09-19 MED ORDER — TAMSULOSIN HCL 0.4 MG PO CAPS: 0.4000 mg | ORAL_CAPSULE | Freq: Every day | ORAL | Status: DC

## 2014-10-24 ENCOUNTER — Encounter: Payer: Self-pay | Admitting: Internal Medicine

## 2014-10-24 ENCOUNTER — Ambulatory Visit (INDEPENDENT_AMBULATORY_CARE_PROVIDER_SITE_OTHER): Payer: BC Managed Care – PPO | Admitting: Internal Medicine

## 2014-10-24 VITALS — BP 110/76 | HR 64 | Temp 97.9°F | Resp 16 | Ht 70.25 in | Wt 207.8 lb

## 2014-10-24 DIAGNOSIS — E785 Hyperlipidemia, unspecified: Secondary | ICD-10-CM

## 2014-10-24 DIAGNOSIS — I10 Essential (primary) hypertension: Secondary | ICD-10-CM

## 2014-10-24 DIAGNOSIS — R7303 Prediabetes: Secondary | ICD-10-CM

## 2014-10-24 DIAGNOSIS — Z79899 Other long term (current) drug therapy: Secondary | ICD-10-CM

## 2014-10-24 DIAGNOSIS — E559 Vitamin D deficiency, unspecified: Secondary | ICD-10-CM

## 2014-10-24 DIAGNOSIS — M159 Polyosteoarthritis, unspecified: Secondary | ICD-10-CM

## 2014-10-24 DIAGNOSIS — M8949 Other hypertrophic osteoarthropathy, multiple sites: Secondary | ICD-10-CM

## 2014-10-24 DIAGNOSIS — M150 Primary generalized (osteo)arthritis: Secondary | ICD-10-CM

## 2014-10-24 DIAGNOSIS — R7309 Other abnormal glucose: Secondary | ICD-10-CM

## 2014-10-24 LAB — CBC WITH DIFFERENTIAL/PLATELET
Basophils Absolute: 0.1 10*3/uL (ref 0.0–0.1)
Basophils Relative: 1 % (ref 0–1)
EOS ABS: 0.1 10*3/uL (ref 0.0–0.7)
EOS PCT: 2 % (ref 0–5)
HEMATOCRIT: 44.5 % (ref 39.0–52.0)
HEMOGLOBIN: 15.2 g/dL (ref 13.0–17.0)
LYMPHS ABS: 1.5 10*3/uL (ref 0.7–4.0)
Lymphocytes Relative: 26 % (ref 12–46)
MCH: 32.1 pg (ref 26.0–34.0)
MCHC: 34.2 g/dL (ref 30.0–36.0)
MCV: 93.9 fL (ref 78.0–100.0)
MONOS PCT: 13 % — AB (ref 3–12)
MPV: 10 fL (ref 9.4–12.4)
Monocytes Absolute: 0.8 10*3/uL (ref 0.1–1.0)
Neutro Abs: 3.4 10*3/uL (ref 1.7–7.7)
Neutrophils Relative %: 58 % (ref 43–77)
Platelets: 256 10*3/uL (ref 150–400)
RBC: 4.74 MIL/uL (ref 4.22–5.81)
RDW: 13.3 % (ref 11.5–15.5)
WBC: 5.8 10*3/uL (ref 4.0–10.5)

## 2014-10-24 MED ORDER — MELOXICAM 15 MG PO TABS: ORAL_TABLET | ORAL | Status: DC

## 2014-10-24 MED ORDER — PREDNISONE 20 MG PO TABS: ORAL_TABLET | ORAL | Status: DC

## 2014-10-24 NOTE — Progress Notes (Signed)
Patient ID: Steven Bray, male   DOB: 1951-08-14, 63 y.o.   MRN: [id number]   This very nice 63 y.o.DWM presents for 3 month follow up with Hypertension, Hyperlipidemia, Pre-Diabetes and Vitamin D Deficiency.    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  BP has been controlled at home. Today's BP: 110/76 mmHg. Patient has had no complaints of any cardiac type chest pain, palpitations, dyspnea/orthopnea/PND, dizziness, claudication, or dependent edema.   Hyperlipidemia is controlled with diet & meds. Patient denies myalgias or other med SE's. Last Lipids were Total  Chol 125; HDL  38; LDL  64; Trig 114 on 07/14/2014.   Also, the patient has history of Morbid Obesity (BMI 29.62) and consequent PreDiabetes and has had no symptoms of reactive hypoglycemia, diabetic polys, paresthesias or visual blurring.  Last A1c was  5.9% on  07/14/2014.   Further, the patient also has history of Vitamin D Deficiency of 36 in 2008 and supplements vitamin D without any suspected side-effects. Last vitamin D was 106 on  03/14/2014.   Medication List   atenolol 100 MG tablet  Commonly known as:  TENORMIN  TAKE 1 TABLET EVERY DAY     atorvastatin 80 MG tablet  Commonly known as:  LIPITOR  Take 80 mg by mouth daily. TAKES 1/2     hydrochlorothiazide 25 MG tablet  Commonly known as:  HYDRODIURIL  TAKE 1/2 TO 1 TABLET EVERY MORNING AS NEEDED FOR BLOOD PRESSURE     lisinopril 40 MG tablet  Commonly known as:  PRINIVIL,ZESTRIL  Take 40 mg by mouth daily. TAKES 1/2     meloxicam 15 MG tablet  Commonly known as:  MOBIC  Take 1/2 to 1 tablet daily with food for pain & inflammation     pantoprazole 40 MG tablet  Commonly known as:  PROTONIX  TAKE 1 TABLET EVERY DAY     predniSONE 20 MG tablet  Commonly known as:  DELTASONE  1 tab 3 x day for 3 days, then 1 tab 2 x day for 3 days, then 1 tab 1 x day for 5 days     tadalafil 20 MG tablet   Commonly known as:  CIALIS  Take 1 tablet (20 mg total) by mouth daily as needed for erectile dysfunction.     tamsulosin 0.4 MG Caps capsule  Commonly known as:  FLOMAX  Take 1 capsule (0.4 mg total) by mouth daily. For prostate     VIAGRA 100 MG tablet  Generic drug:  sildenafil  TAKE 1/2 TO 1 TABLET DAILY AS NEEDED     Vitamin D (Ergocalciferol) 50000 UNITS Caps capsule  Commonly known as:  DRISDOL  TAKE 1 CAPSULE DAILY     Allergies  Allergen Reactions  . Sulfa Antibiotics Nausea Only   PMHx:   Past Medical History  Diagnosis Date  . Hypertension   . Hyperlipidemia   . Pre-diabetes   . GERD (gastroesophageal reflux disease)   . Vitamin D deficiency   . TIA (transient ischemic attack)   . Stroke   . ED (erectile dysfunction)    Immunization History  Administered Date(s) Administered  . PPD Test 12/13/2013  . Pneumococcal-Unspecified 11/24/2008  . Td 11/11/2004   Past Surgical History  Procedure Laterality Date  . Hernia repair Right     INGUINAL   FHx:    Reviewed / unchanged  SHx:    Reviewed / unchanged  Systems Review:  Constitutional: Denies fever, chills, wt changes, headaches, insomnia, fatigue, night sweats, change in appetite. Eyes: Denies redness, blurred vision, diplopia, discharge, itchy, watery eyes.  ENT: Denies discharge, congestion, post nasal drip, epistaxis, sore throat, earache, hearing loss, dental pain, tinnitus, vertigo, sinus pain, snoring.  CV: Denies chest pain, palpitations, irregular heartbeat, syncope, dyspnea, diaphoresis, orthopnea, PND, claudication or edema. Respiratory: denies cough, dyspnea, DOE, pleurisy, hoarseness, laryngitis, wheezing.  Gastrointestinal: Denies dysphagia, odynophagia, heartburn, reflux, water brash, abdominal pain or cramps, nausea, vomiting, bloating, diarrhea, constipation, hematemesis, melena, hematochezia  or hemorrhoids. Genitourinary: Denies dysuria, frequency, urgency, nocturia, hesitancy,  discharge, hematuria or flank pain. Musculoskeletal: Denies arthralgias, myalgias, stiffness, jt. swelling, pain, limping or strain/sprain.  Skin: Denies pruritus, rash, hives, warts, acne, eczema or change in skin lesion(s). Neuro: No weakness, tremor, incoordination, spasms, paresthesia or pain. Psychiatric: Denies confusion, memory loss or sensory loss. Endo: Denies change in weight, skin or hair change.  Heme/Lymph: No excessive bleeding, bruising or enlarged lymph nodes.  Physical Exam  BP 110/76  Pulse 64  Temp 97.9 F   Resp 16  Ht 5' 10.25"   Wt 207 lb 12.8 oz          BMI 29.62  Appears well nourished and in no distress. Eyes: PERRLA, EOMs, conjunctiva no swelling or erythema. Sinuses: No frontal/maxillary tenderness ENT/Mouth: EAC's clear, TM's nl w/o erythema, bulging. Nares clear w/o erythema, swelling, exudates. Oropharynx clear without erythema or exudates. Oral hygiene is good. Tongue normal, non obstructing. Hearing intact.  Neck: Supple. Thyroid nl. Car 2+/2+ without bruits, nodes or JVD. Chest: Respirations nl with BS clear & equal w/o rales, rhonchi, wheezing or stridor.  Cor: Heart sounds normal w/ regular rate and rhythm without sig. murmurs, gallops, clicks, or rubs. Peripheral pulses normal and equal  without edema.  Abdomen: Soft & bowel sounds normal. Non-tender w/o guarding, rebound, hernias, masses, or organomegaly.  Lymphatics: Unremarkable.  Musculoskeletal: Full ROM all peripheral extremities, joint stability, 5/5 strength, and normal gait.  Skin: Warm, dry without exposed rashes, lesions or ecchymosis apparent.  Neuro: Cranial nerves intact, reflexes equal bilaterally. Sensory-motor testing grossly intact. Tendon reflexes grossly intact.  Pysch: Alert & oriented x 3.  Insight and judgement nl & appropriate. No ideations.  Assessment and Plan:  1. Hypertension - Continue monitor blood pressure at home. Continue diet/meds same.  2. Hyperlipidemia -  Continue diet/meds, exercise,& lifestyle modifications. Continue monitor periodic cholesterol/liver & renal functions   3. Pre-Diabetes - Continue diet, exercise, lifestyle modifications. Monitor appropriate labs.  4. Vitamin D Deficiency - Continue supplementation.   Recommended regular exercise, BP monitoring, weight control, and discussed med and SE's. Recommended labs to assess and monitor clinical status. Further disposition pending results of labs.

## 2014-10-24 NOTE — Patient Instructions (Signed)

## 2014-10-25 LAB — HEMOGLOBIN A1C
Hgb A1c MFr Bld: 5.8 % — ABNORMAL HIGH (ref <5.7)
Mean Plasma Glucose: 120 mg/dL — ABNORMAL HIGH (ref <117)

## 2014-10-25 LAB — HEPATIC FUNCTION PANEL
ALBUMIN: 4.2 g/dL (ref 3.5–5.2)
ALT: 13 U/L (ref 0–53)
AST: 16 U/L (ref 0–37)
Alkaline Phosphatase: 59 U/L (ref 39–117)
Bilirubin, Direct: 0.1 mg/dL (ref 0.0–0.3)
Indirect Bilirubin: 0.2 mg/dL (ref 0.2–1.2)
Total Bilirubin: 0.3 mg/dL (ref 0.2–1.2)
Total Protein: 6.6 g/dL (ref 6.0–8.3)

## 2014-10-25 LAB — LIPID PANEL
Cholesterol: 147 mg/dL (ref 0–200)
HDL: 39 mg/dL — ABNORMAL LOW (ref >39)
LDL CALC: 71 mg/dL (ref 0–99)
Total CHOL/HDL Ratio: 3.8 Ratio
Triglycerides: 183 mg/dL — ABNORMAL HIGH (ref <150)
VLDL: 37 mg/dL (ref 0–40)

## 2014-10-25 LAB — TSH: TSH: 1.482 u[IU]/mL (ref 0.350–4.500)

## 2014-10-25 LAB — BASIC METABOLIC PANEL WITH GFR
BUN: 20 mg/dL (ref 6–23)
CO2: 27 meq/L (ref 19–32)
CREATININE: 0.9 mg/dL (ref 0.50–1.35)
Calcium: 9.5 mg/dL (ref 8.4–10.5)
Chloride: 104 mEq/L (ref 96–112)
GFR, Est African American: >89 mL/min
GFR, Est Non African American: >89 mL/min
GLUCOSE: 78 mg/dL (ref 70–99)
Potassium: 4.1 mEq/L (ref 3.5–5.3)
Sodium: 139 mEq/L (ref 135–145)

## 2014-10-25 LAB — INSULIN, FASTING: INSULIN FASTING, SERUM: 6.6 u[IU]/mL (ref 2.0–19.6)

## 2014-10-25 LAB — MAGNESIUM: MAGNESIUM: 2 mg/dL (ref 1.5–2.5)

## 2014-10-25 LAB — VITAMIN D 25 HYDROXY (VIT D DEFICIENCY, FRACTURES): VIT D 25 HYDROXY: 71 ng/mL (ref 30–100)

## 2014-12-09 ENCOUNTER — Other Ambulatory Visit: Payer: Self-pay | Admitting: Internal Medicine

## 2014-12-14 ENCOUNTER — Encounter: Payer: Self-pay | Admitting: Internal Medicine

## 2014-12-14 ENCOUNTER — Ambulatory Visit (INDEPENDENT_AMBULATORY_CARE_PROVIDER_SITE_OTHER): Payer: BLUE CROSS/BLUE SHIELD | Admitting: Internal Medicine

## 2014-12-14 VITALS — BP 126/84 | HR 56 | Temp 98.1°F | Resp 16 | Ht 71.0 in | Wt 213.6 lb

## 2014-12-14 DIAGNOSIS — R7309 Other abnormal glucose: Secondary | ICD-10-CM

## 2014-12-14 DIAGNOSIS — E785 Hyperlipidemia, unspecified: Secondary | ICD-10-CM

## 2014-12-14 DIAGNOSIS — Z125 Encounter for screening for malignant neoplasm of prostate: Secondary | ICD-10-CM

## 2014-12-14 DIAGNOSIS — R7303 Prediabetes: Secondary | ICD-10-CM

## 2014-12-14 DIAGNOSIS — Z1212 Encounter for screening for malignant neoplasm of rectum: Secondary | ICD-10-CM

## 2014-12-14 DIAGNOSIS — Z111 Encounter for screening for respiratory tuberculosis: Secondary | ICD-10-CM

## 2014-12-14 DIAGNOSIS — Z23 Encounter for immunization: Secondary | ICD-10-CM

## 2014-12-14 DIAGNOSIS — R5383 Other fatigue: Secondary | ICD-10-CM

## 2014-12-14 DIAGNOSIS — E291 Testicular hypofunction: Secondary | ICD-10-CM

## 2014-12-14 DIAGNOSIS — Z79899 Other long term (current) drug therapy: Secondary | ICD-10-CM

## 2014-12-14 DIAGNOSIS — E559 Vitamin D deficiency, unspecified: Secondary | ICD-10-CM

## 2014-12-14 DIAGNOSIS — I10 Essential (primary) hypertension: Secondary | ICD-10-CM

## 2014-12-14 NOTE — Patient Instructions (Signed)

## 2014-12-15 ENCOUNTER — Encounter: Payer: Self-pay | Admitting: Internal Medicine

## 2014-12-15 LAB — CBC WITH DIFFERENTIAL/PLATELET
BASOS PCT: 1 % (ref 0–1)
Basophils Absolute: 0.1 10*3/uL (ref 0.0–0.1)
EOS PCT: 3 % (ref 0–5)
Eosinophils Absolute: 0.2 10*3/uL (ref 0.0–0.7)
HCT: 43 % (ref 39.0–52.0)
Hemoglobin: 14.5 g/dL (ref 13.0–17.0)
Lymphocytes Relative: 29 % (ref 12–46)
Lymphs Abs: 1.6 10*3/uL (ref 0.7–4.0)
MCH: 32 pg (ref 26.0–34.0)
MCHC: 33.7 g/dL (ref 30.0–36.0)
MCV: 94.9 fL (ref 78.0–100.0)
MONO ABS: 0.6 10*3/uL (ref 0.1–1.0)
MPV: 10.5 fL (ref 8.6–12.4)
Monocytes Relative: 10 % (ref 3–12)
Neutro Abs: 3.1 10*3/uL (ref 1.7–7.7)
Neutrophils Relative %: 57 % (ref 43–77)
PLATELETS: 219 10*3/uL (ref 150–400)
RBC: 4.53 MIL/uL (ref 4.22–5.81)
RDW: 13.5 % (ref 11.5–15.5)
WBC: 5.5 10*3/uL (ref 4.0–10.5)

## 2014-12-15 LAB — BASIC METABOLIC PANEL WITH GFR
BUN: 23 mg/dL (ref 6–23)
CO2: 22 mEq/L (ref 19–32)
Calcium: 9.1 mg/dL (ref 8.4–10.5)
Chloride: 105 mEq/L (ref 96–112)
Creat: 0.84 mg/dL (ref 0.50–1.35)
GFR, Est African American: >89 mL/min
GFR, Est Non African American: >89 mL/min
Glucose, Bld: 91 mg/dL (ref 70–99)
Potassium: 4 mEq/L (ref 3.5–5.3)
Sodium: 139 mEq/L (ref 135–145)

## 2014-12-15 LAB — MAGNESIUM: MAGNESIUM: 1.8 mg/dL (ref 1.5–2.5)

## 2014-12-15 LAB — HEPATIC FUNCTION PANEL
ALT: 14 U/L (ref 0–53)
AST: 17 U/L (ref 0–37)
Albumin: 3.7 g/dL (ref 3.5–5.2)
Alkaline Phosphatase: 52 U/L (ref 39–117)
BILIRUBIN INDIRECT: 0.3 mg/dL (ref 0.2–1.2)
Bilirubin, Direct: 0.1 mg/dL (ref 0.0–0.3)
Total Bilirubin: 0.4 mg/dL (ref 0.2–1.2)
Total Protein: 6 g/dL (ref 6.0–8.3)

## 2014-12-15 LAB — IRON AND TIBC
%SAT: 21 % (ref 20–55)
IRON: 62 ug/dL (ref 42–165)
TIBC: 296 ug/dL (ref 215–435)
UIBC: 234 ug/dL (ref 125–400)

## 2014-12-15 LAB — INSULIN, FASTING: INSULIN FASTING, SERUM: 15.2 u[IU]/mL (ref 2.0–19.6)

## 2014-12-15 LAB — URINALYSIS, MICROSCOPIC ONLY
BACTERIA UA: NONE SEEN
Casts: NONE SEEN
Crystals: NONE SEEN
Squamous Epithelial / LPF: NONE SEEN

## 2014-12-15 LAB — VITAMIN D 25 HYDROXY (VIT D DEFICIENCY, FRACTURES): VIT D 25 HYDROXY: 63 ng/mL (ref 30–100)

## 2014-12-15 LAB — LIPID PANEL
Cholesterol: 124 mg/dL (ref 0–200)
HDL: 36 mg/dL — ABNORMAL LOW (ref >39)
LDL Cholesterol: 50 mg/dL (ref 0–99)
Total CHOL/HDL Ratio: 3.4 Ratio
Triglycerides: 188 mg/dL — ABNORMAL HIGH (ref <150)
VLDL: 38 mg/dL (ref 0–40)

## 2014-12-15 LAB — VITAMIN B12: Vitamin B-12: 200 pg/mL — ABNORMAL LOW (ref 211–911)

## 2014-12-15 LAB — TSH: TSH: 0.852 u[IU]/mL (ref 0.350–4.500)

## 2014-12-15 LAB — PSA: PSA: 0.35 ng/mL (ref <=4.00)

## 2014-12-15 LAB — HEMOGLOBIN A1C
HEMOGLOBIN A1C: 5.8 % — AB (ref <5.7)
Mean Plasma Glucose: 120 mg/dL — ABNORMAL HIGH (ref <117)

## 2014-12-15 NOTE — Progress Notes (Signed)
Patient ID: Steven Bray, male   DOB: Aug 02, 1951, 64 y.o.   MRN: [id number] Annual Comprehensive Examination  This very nice 64 y.o. DWM presents for complete physical.  Patient has been followed for HTN, Prediabetes, Hyperlipidemia, and Vitamin D Deficiency.   HTN predates since 2002. Patient's BP has been controlled at home.Today's BP: 126/84 mmHg. Patient denies any cardiac symptoms as chest pain, palpitations, shortness of breath, dizziness or ankle swelling.   Patient's hyperlipidemia is controlled with diet and medications. Patient denies myalgias or other medication SE's. Last lipids were at goal -  Total Chol 147; HDL  39*; LDL  71; Trig 183 on 10/24/2014.   Patient has  prediabetes since   Apr 2013 with an A1c 5.7% and patient denies reactive hypoglycemic symptoms, visual blurring, diabetic polys or paresthesias. Last A1c was  5.8% on 10/24/2014.    Finally, patient has history of Vitamin D Deficiency of 36 in 2008 and last vitamin D was  71 on  10/24/2014.  Medication Sig  . aspirin 81 MG tablet Take 81 mg by mouth daily.  Marland Kitchen atenolol (TENORMIN) 100 MG tablet TAKE 1 TABLET EVERY DAY  . atorvastatin (LIPITOR) 80 MG tablet TAKE 1 TABLET EVERY DAY FOR CHOLESTEROL  . hydrochlorothiazide (HYDRODIURIL) 25 MG tablet TAKE 1/2 TO 1 TABLET EVERY MORNING AS NEEDED FOR BLOOD PRESSURE  . lisinopril (PRINIVIL,ZESTRIL) 40 MG tablet Take 40 mg by mouth daily. TAKES 1/2  . meloxicam (MOBIC) 15 MG tablet Take 1/2 to 1 tablet daily with food for pain & inflammation  . pantoprazole (PROTONIX) 40 MG tablet TAKE 1 TABLET EVERY DAY  . tadalafil (CIALIS) 20 MG tablet Take 1 tablet (20 mg total) by mouth daily as needed for erectile dysfunction.  . tamsulosin (FLOMAX) 0.4 MG CAPS capsule Take 1 capsule (0.4 mg total) by mouth daily. For prostate  . VIAGRA 100 MG tablet TAKE 1/2 TO 1 TABLET DAILY AS NEEDED  . Vitamin D, Ergocalciferol, (DRISDOL) 50000 UNITS CAPS capsule TAKE 1 CAPSULE DAILY  . predniSONE  (DELTASONE) 20 MG tablet 1 tab 3 x day for 3 days, then 1 tab 2 x day for 3 days, then 1 tab 1 x day for 5 days   Allergies  Allergen Reactions  . Sulfa Antibiotics Nausea Only   Past Medical History  Diagnosis Date  . Hypertension   . Hyperlipidemia   . Pre-diabetes   . GERD (gastroesophageal reflux disease)   . Vitamin D deficiency   . TIA (transient ischemic attack)   . Stroke   . ED (erectile dysfunction)    Health Maintenance  Topic Date Due  . ZOSTAVAX  08/07/2013  . INFLUENZA VACCINE  06/11/2014  . TETANUS/TDAP  11/11/2014  . COLONOSCOPY  12/09/2022   Immunization History  Administered Date(s) Administered  . PPD Test 12/13/2013, 12/14/2014  . Pneumococcal-Unspecified 11/24/2008  . Td 11/11/2004  . Tdap 12/14/2014   Past Surgical History  Procedure Laterality Date  . Hernia repair Right     INGUINAL   Family History  Problem Relation Age of Onset  . Cancer Mother     LYMPHOMA  . Hyperlipidemia Mother   . Hypertension Mother   . Diabetes Father   . Hypertension Father   . Stroke Father   . Kidney disease Father   . Diabetes Sister   . Early death Daughter   . Drug abuse Son     DIED FROM OD   History   Social History  . Marital Status: Single  Spouse Name: N/A    Number of Children: N/A  . 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 Smoking status: Current Every Day Smoker -- 0.75 packs/day for 42 years    Types: Cigarettes  . Smokeless tobacco: Not on file     Comment: trying to quit and smoking 2 to 5 cigarettes a day.  . Alcohol Use: No     Comment: OCC  . Drug Use: No  . Sexual Activity: Yes    Birth Control/ Protection: None     ROS Constitutional: Denies fever, chills, weight loss/gain, headaches, insomnia, fatigue, night sweats or change in appetite. Eyes: Denies redness, blurred vision, diplopia, discharge, itchy or watery eyes.  ENT: Denies discharge, congestion,  post nasal drip, epistaxis, sore throat, earache, hearing loss, dental pain, Tinnitus, Vertigo, Sinus pain or snoring.  Cardio: Denies chest pain, palpitations, irregular heartbeat, syncope, dyspnea, diaphoresis, orthopnea, PND, claudication or edema Respiratory: denies cough, dyspnea, DOE, pleurisy, hoarseness, laryngitis or wheezing.  Gastrointestinal: Denies dysphagia, heartburn, reflux, water brash, pain, cramps, nausea, vomiting, bloating, diarrhea, constipation, hematemesis, melena, hematochezia, jaundice or hemorrhoids Genitourinary: Denies dysuria, frequency, urgency, nocturia, hesitancy, discharge, hematuria or flank pain Musculoskeletal: Denies arthralgia, myalgia, stiffness, Jt. Swelling, pain, limp or strain/sprain. Denies Falls. Skin: Denies puritis, rash, hives, warts, acne, eczema or change in skin lesion Neuro: No weakness, tremor, incoordination, spasms, paresthesia or pain Psychiatric: Denies confusion, memory loss or sensory loss. Denies Depression. Endocrine: Denies change in weight, skin, hair change, nocturia, and paresthesia, diabetic polys, visual blurring or hyper / hypo glycemic episodes.  Heme/Lymph: No excessive bleeding, bruising or enlarged lymph nodes.  Physical Exam  BP 126/84   Pulse 56  Temp 98.1 F   Resp 16  Ht 5\' 11"    Wt 213 lb 9.6 oz     BMI 29.80   General Appearance: Well nourished, in no apparent distress. Eyes: PERRLA, EOMs, conjunctiva no swelling or erythema, normal fundi and vessels. Sinuses: No frontal/maxillary tenderness ENT/Mouth: EACs patent / TMs  nl. Nares clear without erythema, swelling, mucoid exudates. Oral hygiene is good. No erythema, swelling, or exudate. Tongue normal, non-obstructing. Tonsils not swollen or erythematous. Hearing normal.  Neck: Supple, thyroid normal. No bruits, nodes or JVD. Respiratory: Respiratory effort normal.  BS equal and clear bilateral without rales, rhonci, wheezing or stridor. Cardio: Heart sounds are  normal with regular rate and rhythm and no murmurs, rubs or gallops. Peripheral pulses are normal and equal bilaterally without edema. No aortic or femoral bruits. Chest: symmetric with normal excursions and percussion.  Abdomen: Flat, soft, with bowl sounds. Nontender, no guarding, rebound, hernias, masses, or organomegaly.  Lymphatics: Non tender without lymphadenopathy.  Genitourinary: No hernias.Testes nl. DRE - prostate nl for age - smooth & firm w/o nodules. Musculoskeletal: Full ROM all peripheral extremities, joint stability, 5/5 strength, and normal gait. Skin: Warm and dry without rashes, lesions, cyanosis, clubbing or  ecchymosis.  Neuro: Cranial nerves intact, reflexes equal bilaterally. Normal muscle tone, no cerebellar symptoms. Sensation intact.  Pysch: Awake and oriented X 3 with normal affect, insight and judgment appropriate.   Assessment and Plan  1. Essential hypertension  - EKG 12-Lead - [place], RETROPERITNL ABD,  LTD - TSH - Microalbumin / creatinine urine ratio; Future  2. Hyperlipidemia  - Lipid panel  3. Pre-diabetes  - Hemoglobin A1c - Insulin, fasting  4. Vitamin D deficiency  - Vit D  25 hydroxy  5. Screening for rectal cancer  - POC Hemoccult Bld/Stl   6. Prostate cancer screening  - PSA  7. Other fatigue  - Vitamin B12 - Iron and TIBC  8. Medication management  - Urine Microscopic - CBC with Differential/Platelet - BASIC METABOLIC PANEL WITH GFR - Hepatic function panel - Magnesium  9. Screening examination for pulmonary tuberculosis  - PPD  10. Need for prophylactic vaccination with combined diphtheria-tetanus-pertussis (DTP) vaccine  - Tdap vaccine    Continue prudent diet as discussed, weight control, BP monitoring, regular exercise, and medications as discussed.  Discussed med effects and SE's. Routine screening labs and tests as requested with regular follow-up as recommended.

## 2014-12-19 LAB — TB SKIN TEST
INDURATION: 0 mm
TB Skin Test: NEGATIVE

## 2014-12-26 ENCOUNTER — Other Ambulatory Visit: Payer: Self-pay | Admitting: Occupational Medicine

## 2014-12-26 ENCOUNTER — Ambulatory Visit: Payer: Self-pay

## 2014-12-26 DIAGNOSIS — M79642 Pain in left hand: Secondary | ICD-10-CM

## 2015-02-23 ENCOUNTER — Encounter: Payer: Self-pay | Admitting: Internal Medicine

## 2015-02-23 ENCOUNTER — Ambulatory Visit (INDEPENDENT_AMBULATORY_CARE_PROVIDER_SITE_OTHER): Payer: BLUE CROSS/BLUE SHIELD | Admitting: Internal Medicine

## 2015-02-23 VITALS — BP 118/72 | HR 70 | Temp 98.1°F | Resp 16 | Ht 70.0 in | Wt 208.0 lb

## 2015-02-23 DIAGNOSIS — J069 Acute upper respiratory infection, unspecified: Secondary | ICD-10-CM

## 2015-02-23 MED ORDER — AZITHROMYCIN 250 MG PO TABS: ORAL_TABLET | ORAL | Status: DC

## 2015-02-23 MED ORDER — PREDNISONE 20 MG PO TABS: ORAL_TABLET | ORAL | Status: DC

## 2015-02-23 NOTE — Progress Notes (Signed)
Patient ID: Steven Bray, male   DOB: 1951/03/28, 64 y.o.   MRN: [id number]  HPI  Patient presents to the office for evaluation of cough.  It has been going on for 1 weeks.  Patient reports congestion and cough that  night > day, wet, not related to position.  They also endorse postnasal drip, shortness of breath, wheezing and sinus pressure, nasal congestion, sore throat, ear pain.  .  They have tried none.  They report that nothing has worked.  They denies other sick contacts.  He does not normally have bad seasonal allergies.    Review of Systems  Constitutional: Positive for malaise/fatigue. Negative for fever and chills.  HENT: Positive for congestion, ear pain and sore throat.   Respiratory: Positive for cough, sputum production and wheezing. Negative for hemoptysis and shortness of breath.   Cardiovascular: Negative for chest pain, palpitations and leg swelling.  Neurological: Positive for headaches.    PE:  General:  Alert and non-toxic, WDWN, NAD HEENT: NCAT, PERLA, EOM normal, no occular discharge or erythema.  Nasal mucosal edema with sinus tenderness to palpation.  Oropharynx clear with minimal oropharyngeal edema and erythema.  Mucous membranes moist and pink. Neck:  Cervical adenopathy Chest:  RRR no MRGs.  Lungs clear to auscultation A&P with no wheezes rhonchi or rales.   Abdomen: +BS x 4 quadrants, soft, non-tender, no guarding, rigidity, or rebound. Skin: warm and dry no rash Neuro: A&Ox4, CN II-XII grossly intact  Assessment and Plan:   1. Acute URI Likely allergic rhinitis vs viral infection vs. Possible bacterial infection -start antihistamine -nasal saline - predniSONE (DELTASONE) 20 MG tablet; 3 tabs po day one, then 2 tabs daily x 4 days  Dispense: 11 tablet; Refill: 0 - azithromycin (ZITHROMAX Z-PAK) 250 MG tablet; 2 po day one, then 1 daily x 4 days  Dispense: 5 tablet; Refill: 0

## 2015-02-23 NOTE — Patient Instructions (Signed)

## 2015-03-03 ENCOUNTER — Other Ambulatory Visit: Payer: Self-pay | Admitting: Internal Medicine

## 2015-03-29 ENCOUNTER — Ambulatory Visit: Payer: Self-pay | Admitting: Physician Assistant

## 2015-03-30 ENCOUNTER — Encounter: Payer: Self-pay | Admitting: Physician Assistant

## 2015-03-30 ENCOUNTER — Ambulatory Visit (INDEPENDENT_AMBULATORY_CARE_PROVIDER_SITE_OTHER): Payer: BLUE CROSS/BLUE SHIELD | Admitting: Internal Medicine

## 2015-03-30 VITALS — BP 118/84 | HR 76 | Temp 98.2°F | Resp 16 | Ht 71.0 in | Wt 206.0 lb

## 2015-03-30 DIAGNOSIS — I10 Essential (primary) hypertension: Secondary | ICD-10-CM

## 2015-03-30 DIAGNOSIS — R7309 Other abnormal glucose: Secondary | ICD-10-CM

## 2015-03-30 DIAGNOSIS — E785 Hyperlipidemia, unspecified: Secondary | ICD-10-CM

## 2015-03-30 DIAGNOSIS — Z79899 Other long term (current) drug therapy: Secondary | ICD-10-CM

## 2015-03-30 DIAGNOSIS — E559 Vitamin D deficiency, unspecified: Secondary | ICD-10-CM

## 2015-03-30 DIAGNOSIS — J069 Acute upper respiratory infection, unspecified: Secondary | ICD-10-CM

## 2015-03-30 DIAGNOSIS — Z720 Tobacco use: Secondary | ICD-10-CM

## 2015-03-30 DIAGNOSIS — R7303 Prediabetes: Secondary | ICD-10-CM

## 2015-03-30 LAB — CBC WITH DIFFERENTIAL/PLATELET
Basophils Absolute: 0 10*3/uL (ref 0.0–0.1)
Basophils Relative: 1 % (ref 0–1)
EOS ABS: 0.1 10*3/uL (ref 0.0–0.7)
Eosinophils Relative: 3 % (ref 0–5)
HEMATOCRIT: 44 % (ref 39.0–52.0)
Hemoglobin: 14.9 g/dL (ref 13.0–17.0)
Lymphocytes Relative: 31 % (ref 12–46)
Lymphs Abs: 1.3 10*3/uL (ref 0.7–4.0)
MCH: 32.4 pg (ref 26.0–34.0)
MCHC: 33.9 g/dL (ref 30.0–36.0)
MCV: 95.7 fL (ref 78.0–100.0)
MONOS PCT: 14 % — AB (ref 3–12)
MPV: 10.5 fL (ref 8.6–12.4)
Monocytes Absolute: 0.6 10*3/uL (ref 0.1–1.0)
NEUTROS PCT: 51 % (ref 43–77)
Neutro Abs: 2.1 10*3/uL (ref 1.7–7.7)
Platelets: 238 10*3/uL (ref 150–400)
RBC: 4.6 MIL/uL (ref 4.22–5.81)
RDW: 13.3 % (ref 11.5–15.5)
WBC: 4.2 10*3/uL (ref 4.0–10.5)

## 2015-03-30 MED ORDER — PREDNISONE 20 MG PO TABS: ORAL_TABLET | ORAL | Status: DC

## 2015-03-30 MED ORDER — RANITIDINE HCL 150 MG PO TABS: 150.0000 mg | ORAL_TABLET | Freq: Two times a day (BID) | ORAL | Status: DC

## 2015-03-30 MED ORDER — MOMETASONE FUROATE 50 MCG/ACT NA SUSP: 2.0000 | Freq: Every day | NASAL | Status: DC

## 2015-03-30 NOTE — Progress Notes (Signed)
Patient ID: Steven Bray, male   DOB: 03-Feb-1951, 64 y.o.   MRN: [id number]  Assessment and Plan:  Hypertension:  -Continue medication,  -monitor blood pressure at home.  -Continue DASH diet.   -Reminder to go to the ER if any CP, SOB, nausea, dizziness, severe HA, changes vision/speech, left arm numbness and tingling, and jaw pain.  Cholesterol: -Continue diet and exercise.  -Check cholesterol.   Pre-diabetes: -Continue diet and exercise.  -Check A1C  Vitamin D Def: -check level -continue medications.   Arm Laceration -keep clean and dry -contact office if drainage, redness, or systemic illness signs  Nasal Congestion -flonase and prednisone  Tobacco Abuse -cut down to less than 8 cigarrettes per day  Continue diet and meds as discussed. Further disposition pending results of labs.  HPI 64 y.o. male  presents for 3 month follow up with hypertension, hyperlipidemia, prediabetes and vitamin D.   His blood pressure has been controlled at home, today their BP is BP: 118/84 mmHg.   He does not workout. But he does do a lot of heavy lifting and walking for his job.   He denies chest pain, shortness of breath, dizziness.   He is on cholesterol medication and denies myalgias. His cholesterol is at goal. The cholesterol last visit was:   Lab Results  Component Value Date   CHOL 124 12/14/2014   HDL 36* 12/14/2014   LDLCALC 50 12/14/2014   TRIG 188* 12/14/2014   CHOLHDL 3.4 12/14/2014     He has been working on diet and exercise for prediabetes, and denies foot ulcerations, hyperglycemia, hypoglycemia , increased appetite, nausea, paresthesia of the feet, polydipsia, polyuria, visual disturbances, vomiting and weight loss. Last A1C in the office was:  Lab Results  Component Value Date   HGBA1C 5.8* 12/14/2014    Patient is on Vitamin D supplement.  Lab Results  Component Value Date   VD25OH 12 12/14/2014   He does report that he still has some congestion from his  acute URI several weeks ago.  He states that he is a lot better than he was but he feels that he needs some more prednisone to help completely get rid of it.    He did cut his arm on a lawnmower last week.    He is still smoking.  He is down to 10-12 a day.       Current Medications:  Current Outpatient Prescriptions on File Prior to Visit  Medication Sig Dispense Refill  . aspirin 81 MG tablet Take 81 mg by mouth daily.    Marland Kitchen atenolol (TENORMIN) 100 MG tablet TAKE 1 TABLET EVERY DAY 90 tablet 1  . atorvastatin (LIPITOR) 80 MG tablet TAKE 1 TABLET EVERY DAY FOR CHOLESTEROL 90 tablet 0  . hydrochlorothiazide (HYDRODIURIL) 25 MG tablet TAKE 1/2 TO 1 TABLET EVERY MORNING AS NEEDED FOR BLOOD PRESSURE 90 tablet 2  . lisinopril (PRINIVIL,ZESTRIL) 40 MG tablet Take 40 mg by mouth daily. TAKES 1/2    . meloxicam (MOBIC) 15 MG tablet Take 1/2 to 1 tablet daily with food for pain & inflammation 90 tablet 99  . pantoprazole (PROTONIX) 40 MG tablet TAKE 1 TABLET EVERY DAY 30 tablet 5  . tadalafil (CIALIS) 20 MG tablet Take 1 tablet (20 mg total) by mouth daily as needed for erectile dysfunction. 8 tablet 0  . tamsulosin (FLOMAX) 0.4 MG CAPS capsule Take 1 capsule (0.4 mg total) by mouth daily. For prostate 30 capsule 11  . VIAGRA 100 MG  tablet TAKE 1/2 TO 1 TABLET DAILY AS NEEDED 6 tablet 6  . Vitamin D, Ergocalciferol, (DRISDOL) 50000 UNITS CAPS capsule TAKE 1 CAPSULE DAILY 30 capsule 99   No current facility-administered medications on file prior to visit.    Medical History:  Past Medical History  Diagnosis Date  . Hypertension   . Hyperlipidemia   . Pre-diabetes   . GERD (gastroesophageal reflux disease)   . Vitamin D deficiency   . TIA (transient ischemic attack)   . Stroke   . ED (erectile dysfunction)     Allergies:  Allergies  Allergen Reactions  . Sulfa Antibiotics Nausea Only     Review of Systems:  Review of Systems  Constitutional: Negative for fever, chills and  malaise/fatigue.  HENT: Positive for congestion. Negative for ear pain, nosebleeds and sore throat.   Eyes: Negative.   Respiratory: Negative for cough, shortness of breath and wheezing.   Cardiovascular: Negative for chest pain, palpitations and leg swelling.  Gastrointestinal: Negative for heartburn, nausea, vomiting, abdominal pain, diarrhea, constipation, blood in stool and melena.  Genitourinary: Negative for dysuria, urgency and frequency.  Skin:       Cut on arm   Neurological: Positive for dizziness. Negative for sensory change and headaches.  Psychiatric/Behavioral: Negative for depression. The patient is not nervous/anxious.     Family history- Review and unchanged  Social history- Review and unchanged  Physical Exam: BP 118/84 mmHg  Pulse 76  Temp(Src) 98.2 F (36.8 C) (Temporal)  Resp 16  Ht 5\' 11"  (1.803 m)  Wt 206 lb (93.441 kg)  BMI 28.74 kg/m2 Wt Readings from Last 3 Encounters:  03/30/15 206 lb (93.441 kg)  02/23/15 208 lb (94.348 kg)  12/14/14 213 lb 9.6 oz (96.888 kg)    General Appearance: Well nourished well developed, in no apparent distress. Eyes: PERRLA, EOMs, conjunctiva no swelling or erythema ENT/Mouth: Ear canals normal without obstruction, swelling, erythma, discharge.  TMs normal bilaterally.  Oropharynx moist, clear, without exudate, or postoropharyngeal swelling. Neck: Supple, thyroid normal,no cervical adenopathy  Respiratory: Respiratory effort normal, Breath sounds clear A&P without rhonchi, wheeze, or rale.  No retractions, no accessory usage. Cardio: RRR with no MRGs. Brisk peripheral pulses without edema.  Abdomen: Soft, + BS,  Non tender, no guarding, rebound, hernias, masses. Musculoskeletal: Full ROM, 5/5 strength, Normal gait Skin: 2 cm laceration to the forearm.  Clean and dry without drainage. Warm, dry without rashes, lesions, ecchymosis.  Neuro: Awake and oriented X 3, Cranial nerves intact. Normal muscle tone, no cerebellar  symptoms. Psych: Normal affect, Insight and Judgment appropriate.    FORCUCCI, Bettylou Frew, PA-C 3:37 PM [facility]

## 2015-03-30 NOTE — Patient Instructions (Signed)
GETTING OFF OF PPI's    Nexium/protonix/prilosec/Omeprazole/Dexilant/Aciphex are called PPI's, they are great at healing your stomach but should only be taken for a short period of time.     Recent studies have shown that taken for a long time they  can increase the risk of osteoporosis (weakening of your bones), pneumonia, low magnesium, restless legs, Cdiff (infection that causes diarrhea), DEMENTIA and most recently kidney damage / disease / insufficiency.     Due to this information we want to try to stop the PPI but if you try to stop it abruptly this can cause rebound acid and worsening symptoms.   So this is how we want you to get off the PPI:  - Start taking the nexium/protonix/prilosec/PPI  every other day with  zantac (ranitidine) 2 x a day for 2-4 weeks  - then decrease the PPI to every 3 days while taking the zantac (ranitidine) twice a day the other  days for 2-4  Weeks  - then you can try the zantac (ranitidine) once at night or up to 2 x day as needed.  - you can continue on this once at night or stop all together  - Avoid alcohol, spicy foods, NSAIDS (aleve, ibuprofen) at this time. See foods below.   +++++++++++++++++++++++++++++++++++++++++++  Food Choices for Gastroesophageal Reflux Disease  When you have gastroesophageal reflux disease (GERD), the foods you eat and your eating habits are very important. Choosing the right foods can help ease the discomfort of GERD. WHAT GENERAL GUIDELINES DO I NEED TO FOLLOW?  Choose fruits, vegetables, whole grains, low-fat dairy products, and low-fat meat, fish, and poultry.  Limit fats such as oils, salad dressings, butter, nuts, and avocado.  Keep a food diary to identify foods that cause symptoms.  Avoid foods that cause reflux. These may be different for different people.  Eat frequent small meals instead of three large meals each day.  Eat your meals slowly, in a relaxed setting.  Limit fried foods.  Cook foods  using methods other than frying.  Avoid drinking alcohol.  Avoid drinking large amounts of liquids with your meals.  Avoid bending over or lying down until 2-3 hours after eating.   WHAT FOODS ARE NOT RECOMMENDED? The following are some foods and drinks that may worsen your symptoms:  Vegetables Tomatoes. Tomato juice. Tomato and spaghetti sauce. Chili peppers. Onion and garlic. Horseradish. Fruits Oranges, grapefruit, and lemon (fruit and juice). Meats High-fat meats, fish, and poultry. This includes hot dogs, ribs, ham, sausage, salami, and bacon. Dairy Whole milk and chocolate milk. Sour cream. Cream. Butter. Ice cream. Cream cheese.  Beverages Coffee and tea, with or without caffeine. Carbonated beverages or energy drinks. Condiments Hot sauce. Barbecue sauce.  Sweets/Desserts Chocolate and cocoa. Donuts. Peppermint and spearmint. Fats and Oils High-fat foods, including French fries and potato chips. Other Vinegar. Strong spices, such as black pepper, white pepper, red pepper, cayenne, curry powder, cloves, ginger, and chili powder. Nexium/protonix/prilosec are called PPI's, they are great at healing your stomach but should only be taken for a short period of time.    

## 2015-03-31 LAB — BASIC METABOLIC PANEL WITH GFR
BUN: 18 mg/dL (ref 6–23)
CO2: 28 meq/L (ref 19–32)
Calcium: 9.1 mg/dL (ref 8.4–10.5)
Chloride: 105 mEq/L (ref 96–112)
Creat: 0.95 mg/dL (ref 0.50–1.35)
GFR, Est Non African American: 86 mL/min
GLUCOSE: 82 mg/dL (ref 70–99)
Potassium: 4 mEq/L (ref 3.5–5.3)
SODIUM: 140 meq/L (ref 135–145)

## 2015-03-31 LAB — HEPATIC FUNCTION PANEL
ALBUMIN: 4.1 g/dL (ref 3.5–5.2)
ALT: 13 U/L (ref 0–53)
AST: 16 U/L (ref 0–37)
Alkaline Phosphatase: 57 U/L (ref 39–117)
Bilirubin, Direct: 0.1 mg/dL (ref 0.0–0.3)
Indirect Bilirubin: 0.5 mg/dL (ref 0.2–1.2)
TOTAL PROTEIN: 6.4 g/dL (ref 6.0–8.3)
Total Bilirubin: 0.6 mg/dL (ref 0.2–1.2)

## 2015-03-31 LAB — LIPID PANEL
Cholesterol: 113 mg/dL (ref 0–200)
HDL: 35 mg/dL — ABNORMAL LOW (ref >=40)
LDL CALC: 61 mg/dL (ref 0–99)
Total CHOL/HDL Ratio: 3.2 Ratio
Triglycerides: 85 mg/dL (ref <150)
VLDL: 17 mg/dL (ref 0–40)

## 2015-03-31 LAB — HEMOGLOBIN A1C
Hgb A1c MFr Bld: 5.8 % — ABNORMAL HIGH (ref <5.7)
Mean Plasma Glucose: 120 mg/dL — ABNORMAL HIGH (ref <117)

## 2015-03-31 LAB — MAGNESIUM: Magnesium: 1.9 mg/dL (ref 1.5–2.5)

## 2015-03-31 LAB — TSH: TSH: 1.137 u[IU]/mL (ref 0.350–4.500)

## 2015-03-31 LAB — INSULIN, RANDOM: INSULIN: 5.2 u[IU]/mL (ref 2.0–19.6)

## 2015-04-02 LAB — VITAMIN D 1,25 DIHYDROXY
VITAMIN D2 1, 25 (OH): 41 pg/mL
Vitamin D 1, 25 (OH)2 Total: 41 pg/mL (ref 18–72)
Vitamin D3 1, 25 (OH)2: <8 pg/mL

## 2015-06-05 ENCOUNTER — Telehealth: Payer: Self-pay | Admitting: *Deleted

## 2015-06-05 NOTE — Telephone Encounter (Signed)
Patient states insurance will not cover Zantac Rx.  Per Aura Dials, PA-C, advised patient he will need to purchase OTC Ranitidine and take 150 mg BID.  Patient expressed understanding.

## 2015-07-03 ENCOUNTER — Ambulatory Visit (INDEPENDENT_AMBULATORY_CARE_PROVIDER_SITE_OTHER): Payer: BLUE CROSS/BLUE SHIELD | Admitting: Internal Medicine

## 2015-07-03 ENCOUNTER — Encounter: Payer: Self-pay | Admitting: Internal Medicine

## 2015-07-03 VITALS — BP 108/70 | HR 64 | Temp 97.3°F | Resp 16 | Ht 71.0 in | Wt 202.2 lb

## 2015-07-03 DIAGNOSIS — Z79899 Other long term (current) drug therapy: Secondary | ICD-10-CM | POA: Diagnosis not present

## 2015-07-03 DIAGNOSIS — E559 Vitamin D deficiency, unspecified: Secondary | ICD-10-CM | POA: Diagnosis not present

## 2015-07-03 DIAGNOSIS — R7309 Other abnormal glucose: Secondary | ICD-10-CM | POA: Diagnosis not present

## 2015-07-03 DIAGNOSIS — E785 Hyperlipidemia, unspecified: Secondary | ICD-10-CM

## 2015-07-03 DIAGNOSIS — I10 Essential (primary) hypertension: Secondary | ICD-10-CM

## 2015-07-03 DIAGNOSIS — Z6828 Body mass index (BMI) 28.0-28.9, adult: Secondary | ICD-10-CM | POA: Diagnosis not present

## 2015-07-03 DIAGNOSIS — R7303 Prediabetes: Secondary | ICD-10-CM

## 2015-07-03 NOTE — Patient Instructions (Signed)

## 2015-07-03 NOTE — Progress Notes (Signed)
Patient ID: Steven Bray, male   DOB: 24-Nov-1950, 64 y.o.   MRN: [id number]   This very nice 64 y.o. DWM presents for 3 month follow up with Hypertension, Hyperlipidemia, Pre-Diabetes and Vitamin D Deficiency.    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  BP has been controlled since that time and there have been no more seizures. Today's BP: 108/70 mmHg. Patient has had no complaints of any cardiac type chest pain, palpitations, dyspnea/orthopnea/PND, dizziness, claudication, or dependent edema.   Hyperlipidemia is controlled with diet & meds. Patient denies myalgias or other med SE's. Last Lipids were at goal - Cholesterol 113; HDL 35*; LDL 61; Triglycerides 85 on 03/30/2015.   Also, the patient has history of PreDiabetes since 2013 with A1c 5.7% and has had no symptoms of reactive hypoglycemia, diabetic polys, paresthesias or visual blurring.  Last A1c was  5.8% on 03/30/2015.    Further, the patient also has history of Vitamin D Deficiency of 36 in 2008 and supplements vitamin D without any suspected side-effects. Last vitamin D was 63 on 12/14/2014.  Medication Sig  . aspirin 81 MG tablet Take 81 mg by mouth daily.  Marland Kitchen atenolol (TENORMIN) 100 MG tablet TAKE 1 TABLET EVERY DAY  . atorvastatin (LIPITOR) 80 MG tablet TAKE 1 TABLET EVERY DAY FOR CHOLESTEROL  . hctz 25 MG tablet TAKE 1/2 TO 1 TABLET EVERY MORNING AS NEEDED FOR BLOOD PRESSURE  . meloxicam (MOBIC) 15 MG tablet Take 1/2 to 1 tablet daily with food for pain & inflammation  . mometasone (NASONEX)  nasal spray Place 2 sprays into the nose daily.  . ranitidine (ZANTAC) 150 MG tablet Take 1 tablet (150 mg total) by mouth 2 (two) times daily.  . Tadalafil 20 MG tablet Take 1 tablet (20 mg total) by mouth daily as needed for erectile dysfunction.  . tamsulosin  0.4 MG CAPS  Take 1 capsule (0.4 mg total) by mouth daily. For prostate  . VIAGRA 100 MG tablet TAKE 1/2 TO  1 TABLET DAILY AS NEEDED  . Vitamin D 50,000 UNITS TAKE 1 CAPSULE DAILY  . lisinopril  40 MG tablet Take 40 mg by mouth daily. TAKES 1/2  . pantoprazole  40 MG tablet TAKE 1 TABLET EVERY DAY   Allergies  Allergen Reactions  . Sulfa Antibiotics Nausea Only   PMHx:   Past Medical History  Diagnosis Date  . Hypertension   . Hyperlipidemia   . Pre-diabetes   . GERD (gastroesophageal reflux disease)   . Vitamin D deficiency   . TIA (transient ischemic attack)   . Stroke   . ED (erectile dysfunction)    Immunization History  Administered Date(s) Administered  . PPD Test 12/13/2013, 12/14/2014  . Pneumococcal-Unspecified 11/24/2008  . Td 11/11/2004  . Tdap 12/14/2014   Past Surgical History  Procedure Laterality Date  . Hernia repair Right     INGUINAL   FHx:    Reviewed / unchanged  SHx:    Reviewed / unchanged  Systems Review:  Constitutional: Denies fever, chills, wt changes, headaches, insomnia, fatigue, night sweats, change in appetite. Eyes: Denies redness, blurred vision, diplopia, discharge, itchy, watery eyes.  ENT: Denies discharge, congestion, post nasal drip, epistaxis, sore throat, earache, hearing loss, dental pain, tinnitus, vertigo, sinus pain, snoring.  CV: Denies chest pain, palpitations, irregular heartbeat, syncope, dyspnea, diaphoresis, orthopnea, PND, claudication or edema. Respiratory: denies cough, dyspnea, DOE, pleurisy, hoarseness,  laryngitis, wheezing.  Gastrointestinal: Denies dysphagia, odynophagia, heartburn, reflux, water brash, abdominal pain or cramps, nausea, vomiting, bloating, diarrhea, constipation, hematemesis, melena, hematochezia  or hemorrhoids. Genitourinary: Denies dysuria, frequency, urgency, nocturia, hesitancy, discharge, hematuria or flank pain. Musculoskeletal: Denies arthralgias, myalgias, stiffness, jt. swelling, pain, limping or strain/sprain.  Skin: Denies pruritus, rash, hives, warts, acne, eczema or change in skin  lesion(s). Neuro: No weakness, tremor, incoordination, spasms, paresthesia or pain. Psychiatric: Denies confusion, memory loss or sensory loss. Endo: Denies change in weight, skin or hair change.  Heme/Lymph: No excessive bleeding, bruising or enlarged lymph nodes.  Physical Exam  BP 108/70 mmHg  Pulse 64  Temp(Src) 97.3 F (36.3 C)  Resp 16  Ht 5\' 11"  (1.803 m)  Wt 202 lb 3.2 oz (91.717 kg)  BMI 28.21 kg/m2  Appears well nourished and in no distress. Eyes: PERRLA, EOMs, conjunctiva no swelling or erythema. Sinuses: No frontal/maxillary tenderness ENT/Mouth: EAC's clear, TM's nl w/o erythema, bulging. Nares clear w/o erythema, swelling, exudates. Oropharynx clear without erythema or exudates. Oral hygiene is good. Tongue normal, non obstructing. Hearing intact.  Neck: Supple. Thyroid nl. Car 2+/2+ without bruits, nodes or JVD. Chest: Respirations nl with BS clear & equal w/o rales, rhonchi, wheezing or stridor.  Cor: Heart sounds normal w/ regular rate and rhythm without sig. murmurs, gallops, clicks, or rubs. Peripheral pulses normal and equal  without edema.  Abdomen: Soft & bowel sounds normal. Non-tender w/o guarding, rebound, hernias, masses, or organomegaly.  Lymphatics: Unremarkable.  Musculoskeletal: Full ROM all peripheral extremities, joint stability, 5/5 strength, and normal gait.  Skin: Warm, dry without exposed rashes, lesions or ecchymosis apparent.  Neuro: Cranial nerves intact, reflexes equal bilaterally. Sensory-motor testing grossly intact. Tendon reflexes grossly intact.  Pysch: Alert & oriented x 3.  Insight and judgement nl & appropriate. No ideations.  Assessment and Plan:  1. Essential hypertension  - TSH  2. Hyperlipidemia  - Lipid panel  3. Pre-diabetes  - Hemoglobin A1c - Insulin, random  4. Vitamin D deficiency  - Vit D  25 hydroxy   5. Medication management  - CBC with Differential/Platelet - BASIC METABOLIC PANEL WITH GFR - Hepatic  function panel - Magnesium   Recommended regular exercise, BP monitoring, weight control, and discussed med and SE's. Recommended labs to assess and monitor clinical status. Further disposition pending results of labs. Over 30 minutes of exam, counseling, chart review was performed

## 2015-07-04 LAB — CBC WITH DIFFERENTIAL/PLATELET
Basophils Absolute: 0.1 10*3/uL (ref 0.0–0.1)
Basophils Relative: 1 % (ref 0–1)
Eosinophils Absolute: 0.2 10*3/uL (ref 0.0–0.7)
Eosinophils Relative: 3 % (ref 0–5)
HEMATOCRIT: 41.6 % (ref 39.0–52.0)
Hemoglobin: 14.1 g/dL (ref 13.0–17.0)
LYMPHS ABS: 1.3 10*3/uL (ref 0.7–4.0)
LYMPHS PCT: 26 % (ref 12–46)
MCH: 32.5 pg (ref 26.0–34.0)
MCHC: 33.9 g/dL (ref 30.0–36.0)
MCV: 95.9 fL (ref 78.0–100.0)
MPV: 10 fL (ref 8.6–12.4)
Monocytes Absolute: 0.7 10*3/uL (ref 0.1–1.0)
Monocytes Relative: 13 % — ABNORMAL HIGH (ref 3–12)
NEUTROS ABS: 2.9 10*3/uL (ref 1.7–7.7)
NEUTROS PCT: 57 % (ref 43–77)
Platelets: 222 10*3/uL (ref 150–400)
RBC: 4.34 MIL/uL (ref 4.22–5.81)
RDW: 13.5 % (ref 11.5–15.5)
WBC: 5 10*3/uL (ref 4.0–10.5)

## 2015-07-04 LAB — LIPID PANEL
CHOL/HDL RATIO: 3.9 ratio (ref <=5.0)
Cholesterol: 118 mg/dL — ABNORMAL LOW (ref 125–200)
HDL: 30 mg/dL — ABNORMAL LOW (ref >=40)
LDL CALC: 56 mg/dL (ref <130)
Triglycerides: 162 mg/dL — ABNORMAL HIGH (ref <150)
VLDL: 32 mg/dL — ABNORMAL HIGH (ref <30)

## 2015-07-04 LAB — BASIC METABOLIC PANEL WITH GFR
BUN: 21 mg/dL (ref 7–25)
CO2: 28 mmol/L (ref 20–31)
CREATININE: 0.91 mg/dL (ref 0.70–1.25)
Calcium: 9.2 mg/dL (ref 8.6–10.3)
Chloride: 105 mmol/L (ref 98–110)
GFR, Est African American: >89 mL/min (ref >=60)
GFR, Est Non African American: >89 mL/min (ref >=60)
Glucose, Bld: 88 mg/dL (ref 65–99)
Potassium: 4 mmol/L (ref 3.5–5.3)
Sodium: 142 mmol/L (ref 135–146)

## 2015-07-04 LAB — HEPATIC FUNCTION PANEL
ALT: 11 U/L (ref 9–46)
AST: 14 U/L (ref 10–35)
Albumin: 4 g/dL (ref 3.6–5.1)
Alkaline Phosphatase: 55 U/L (ref 40–115)
Bilirubin, Direct: 0.1 mg/dL (ref <=0.2)
Indirect Bilirubin: 0.3 mg/dL (ref 0.2–1.2)
Total Bilirubin: 0.4 mg/dL (ref 0.2–1.2)
Total Protein: 6.1 g/dL (ref 6.1–8.1)

## 2015-07-04 LAB — MAGNESIUM: MAGNESIUM: 2 mg/dL (ref 1.5–2.5)

## 2015-07-04 LAB — TSH: TSH: 1.064 u[IU]/mL (ref 0.350–4.500)

## 2015-07-04 LAB — VITAMIN D 25 HYDROXY (VIT D DEFICIENCY, FRACTURES): Vit D, 25-Hydroxy: 98 ng/mL (ref 30–100)

## 2015-07-04 LAB — HEMOGLOBIN A1C
HEMOGLOBIN A1C: 5.6 % (ref <5.7)
MEAN PLASMA GLUCOSE: 114 mg/dL (ref <117)

## 2015-07-04 LAB — INSULIN, RANDOM: Insulin: 8.1 u[IU]/mL (ref 2.0–19.6)

## 2015-09-06 ENCOUNTER — Other Ambulatory Visit: Payer: Self-pay | Admitting: Internal Medicine

## 2015-10-03 ENCOUNTER — Encounter: Payer: Self-pay | Admitting: Physician Assistant

## 2015-10-03 ENCOUNTER — Ambulatory Visit (INDEPENDENT_AMBULATORY_CARE_PROVIDER_SITE_OTHER): Payer: BLUE CROSS/BLUE SHIELD | Admitting: Physician Assistant

## 2015-10-03 VITALS — BP 120/70 | HR 60 | Temp 97.9°F | Resp 14 | Ht 71.0 in | Wt 203.0 lb

## 2015-10-03 DIAGNOSIS — E785 Hyperlipidemia, unspecified: Secondary | ICD-10-CM

## 2015-10-03 DIAGNOSIS — E559 Vitamin D deficiency, unspecified: Secondary | ICD-10-CM

## 2015-10-03 DIAGNOSIS — I10 Essential (primary) hypertension: Secondary | ICD-10-CM

## 2015-10-03 DIAGNOSIS — R7303 Prediabetes: Secondary | ICD-10-CM

## 2015-10-03 DIAGNOSIS — R350 Frequency of micturition: Secondary | ICD-10-CM

## 2015-10-03 DIAGNOSIS — Z79899 Other long term (current) drug therapy: Secondary | ICD-10-CM

## 2015-10-03 DIAGNOSIS — Z720 Tobacco use: Secondary | ICD-10-CM | POA: Insufficient documentation

## 2015-10-03 LAB — LIPID PANEL
CHOL/HDL RATIO: 3.2 ratio (ref <=5.0)
CHOLESTEROL: 110 mg/dL — AB (ref 125–200)
HDL: 34 mg/dL — ABNORMAL LOW (ref >=40)
LDL Cholesterol: 59 mg/dL (ref <130)
Triglycerides: 87 mg/dL (ref <150)
VLDL: 17 mg/dL (ref <30)

## 2015-10-03 LAB — BASIC METABOLIC PANEL WITH GFR
BUN: 18 mg/dL (ref 7–25)
CALCIUM: 8.9 mg/dL (ref 8.6–10.3)
CO2: 28 mmol/L (ref 20–31)
CREATININE: 0.81 mg/dL (ref 0.70–1.25)
Chloride: 103 mmol/L (ref 98–110)
GFR, Est African American: >89 mL/min (ref >=60)
GFR, Est Non African American: >89 mL/min (ref >=60)
Glucose, Bld: 74 mg/dL (ref 65–99)
Potassium: 4.1 mmol/L (ref 3.5–5.3)
SODIUM: 137 mmol/L (ref 135–146)

## 2015-10-03 LAB — CBC WITH DIFFERENTIAL/PLATELET
BASOS PCT: 0 % (ref 0–1)
Basophils Absolute: 0 10*3/uL (ref 0.0–0.1)
EOS ABS: 0.2 10*3/uL (ref 0.0–0.7)
Eosinophils Relative: 3 % (ref 0–5)
HCT: 40.5 % (ref 39.0–52.0)
HEMOGLOBIN: 13.9 g/dL (ref 13.0–17.0)
Lymphocytes Relative: 24 % (ref 12–46)
Lymphs Abs: 1.4 10*3/uL (ref 0.7–4.0)
MCH: 32.6 pg (ref 26.0–34.0)
MCHC: 34.3 g/dL (ref 30.0–36.0)
MCV: 94.8 fL (ref 78.0–100.0)
MONO ABS: 0.8 10*3/uL (ref 0.1–1.0)
MONOS PCT: 13 % — AB (ref 3–12)
MPV: 9.9 fL (ref 8.6–12.4)
Neutro Abs: 3.5 10*3/uL (ref 1.7–7.7)
Neutrophils Relative %: 60 % (ref 43–77)
Platelets: 221 10*3/uL (ref 150–400)
RBC: 4.27 MIL/uL (ref 4.22–5.81)
RDW: 13.2 % (ref 11.5–15.5)
WBC: 5.8 10*3/uL (ref 4.0–10.5)

## 2015-10-03 LAB — HEPATIC FUNCTION PANEL
ALBUMIN: 3.7 g/dL (ref 3.6–5.1)
ALT: 14 U/L (ref 9–46)
AST: 16 U/L (ref 10–35)
Alkaline Phosphatase: 59 U/L (ref 40–115)
BILIRUBIN DIRECT: 0.1 mg/dL (ref <=0.2)
Indirect Bilirubin: 0.4 mg/dL (ref 0.2–1.2)
TOTAL PROTEIN: 5.9 g/dL — AB (ref 6.1–8.1)
Total Bilirubin: 0.5 mg/dL (ref 0.2–1.2)

## 2015-10-03 LAB — MAGNESIUM: MAGNESIUM: 1.7 mg/dL (ref 1.5–2.5)

## 2015-10-03 LAB — HEMOGLOBIN A1C
Hgb A1c MFr Bld: 5.6 % (ref <5.7)
MEAN PLASMA GLUCOSE: 114 mg/dL (ref <117)

## 2015-10-03 MED ORDER — CIPROFLOXACIN HCL 500 MG PO TABS: 500.0000 mg | ORAL_TABLET | Freq: Two times a day (BID) | ORAL | Status: AC

## 2015-10-03 NOTE — Patient Instructions (Signed)
Urinary Tract Infection Urinary tract infections (UTIs) can develop anywhere along your urinary tract. Your urinary tract is your body's drainage system for removing wastes and extra water. Your urinary tract includes two kidneys, two ureters, a bladder, and a urethra. Your kidneys are a pair of bean-shaped organs. Each kidney is about the size of your fist. They are located below your ribs, one on each side of your spine. CAUSES Infections are caused by microbes, which are microscopic organisms, including fungi, viruses, and bacteria. These organisms are so small that they can only be seen through a microscope. Bacteria are the microbes that most commonly cause UTIs. SYMPTOMS  Symptoms of UTIs may vary by age and gender of the patient and by the location of the infection. Symptoms in young women typically include a frequent and intense urge to urinate and a painful, burning feeling in the bladder or urethra during urination. Older women and men are more likely to be tired, shaky, and weak and have muscle aches and abdominal pain. A fever may mean the infection is in your kidneys. Other symptoms of a kidney infection include pain in your back or sides below the ribs, nausea, and vomiting. DIAGNOSIS To diagnose a UTI, your caregiver will ask you about your symptoms. Your caregiver will also ask you to provide a urine sample. The urine sample will be tested for bacteria and white blood cells. White blood cells are made by your body to help fight infection. TREATMENT  Typically, UTIs can be treated with medication. Because most UTIs are caused by a bacterial infection, they usually can be treated with the use of antibiotics. The choice of antibiotic and length of treatment depend on your symptoms and the type of bacteria causing your infection. HOME CARE INSTRUCTIONS  If you were prescribed antibiotics, take them exactly as your caregiver instructs you. Finish the medication even if you feel better after  you have only taken some of the medication.  Drink enough water and fluids to keep your urine clear or pale yellow.  Avoid caffeine, tea, and carbonated beverages. They tend to irritate your bladder.  Empty your bladder often. Avoid holding urine for long periods of time.  Empty your bladder before and after sexual intercourse.  After a bowel movement, women should cleanse from front to back. Use each tissue only once. SEEK MEDICAL CARE IF:   You have back pain.  You develop a fever.  Your symptoms do not begin to resolve within 3 days. SEEK IMMEDIATE MEDICAL CARE IF:   You have severe back pain or lower abdominal pain.  You develop chills.  You have nausea or vomiting.  You have continued burning or discomfort with urination. MAKE SURE YOU:   Understand these instructions.  Will watch your condition.  Will get help right away if you are not doing well or get worse.   This information is not intended to replace advice given to you by your health care provider. Make sure you discuss any questions you have with your health care provider.   Document Released: 08/07/2005 Document Revised: 07/19/2015 Document Reviewed: 12/06/2011 Elsevier Interactive Patient Education 2016 [employer]   Prostatitis Prostatitis is redness, soreness, and puffiness (swelling) of the prostate gland. The prostate gland is the walnut-sized gland located just below your bladder. HOME CARE:   Take all medicines as told by your doctor.  Take warm-water baths (sitz baths) as told by your doctor. GET HELP IF:  Your symptoms get worse, not better.  You  have a fever. GET HELP RIGHT AWAY IF:   You have chills.  You feel sick to your stomach (nauseous) or like you will throw up (vomit).  You feel lightheaded or like you will pass out (faint).  You are unable to pee (urinate).  You have blood or blood clumps (clots) in your pee (urine). MAKE SURE YOU:  Understand these  instructions.  Will watch your condition.  Will get help right away if you are not doing well or get worse.   This information is not intended to replace advice given to you by your health care provider. Make sure you discuss any questions you have with your health care provider.   Document Released: 04/28/2012 Document Revised: 11/18/2014 Document Reviewed: 05/17/2013 Elsevier Interactive Patient Education [employer].

## 2015-10-03 NOTE — Progress Notes (Signed)
Assessment and Plan:  Hypertension: Continue medication, monitor blood pressure at home. Continue DASH diet. Cholesterol: Continue diet and exercise. Check cholesterol.  Pre-diabetes-Continue diet and exercise. Check A1C Vitamin D Def- check level and continue medications.  ED- patient got a prescription of cialis and it did well.  Lower back pain- ? Mechanical versus UTI versus prostate versus kidney infection- get urine, mobic given- can text with results.   Continue diet and meds as discussed. Further disposition pending results of labs.  HPI 64 y.o. male  presents for 3 month follow up with hypertension, hyperlipidemia, prediabetes and vitamin D. His blood pressure has been controlled at home, today their BP is BP: 120/70 mmHg He does not workout, but he is very active at work. He denies chest pain, shortness of breath, dizziness.  He is on cholesterol medication and denies myalgias. His cholesterol is at goal. The cholesterol last visit was:   Lab Results  Component Value Date   CHOL 118* 07/03/2015   HDL 30* 07/03/2015   LDLCALC 56 07/03/2015   TRIG 162* 07/03/2015   CHOLHDL 3.9 07/03/2015   He has been working on diet and exercise for prediabetes, and denies paresthesia of the feet, polydipsia and polyuria. Last A1C in the office was:  Lab Results  Component Value Date   HGBA1C 5.6 07/03/2015   Patient is on Vitamin D supplement.  Lab Results  Component Value Date   VD25OH 98 07/03/2015   Patient has had lower back pain and lower pelvic pain with urinating, worse in the AM, x 3-4 days, no fever, chills, blood in urine.  BMI is Body mass index is 28.33 kg/(m^2)., he is working on diet and exercise. Wt Readings from Last 3 Encounters:  10/03/15 203 lb (92.08 kg)  07/03/15 202 lb 3.2 oz (91.717 kg)  03/30/15 206 lb (93.441 kg)    Current Medications:  Current Outpatient Prescriptions on File Prior to Visit  Medication Sig Dispense Refill  . aspirin 81 MG tablet Take 81  mg by mouth daily.    Marland Kitchen atenolol (TENORMIN) 100 MG tablet TAKE 1 TABLET EVERY DAY 90 tablet 1  . atorvastatin (LIPITOR) 80 MG tablet TAKE 1 TABLET EVERY DAY FOR CHOLESTEROL 90 tablet 0  . hydrochlorothiazide (HYDRODIURIL) 25 MG tablet TAKE 1/2 TO 1 TABLET EVERY MORNING AS NEEDED FOR BLOOD PRESSURE 90 tablet 2  . lisinopril (PRINIVIL,ZESTRIL) 40 MG tablet Take 40 mg by mouth daily. TAKES 1/2    . meloxicam (MOBIC) 15 MG tablet Take 1/2 to 1 tablet daily with food for pain & inflammation 90 tablet 99  . ranitidine (ZANTAC) 150 MG tablet Take 1 tablet (150 mg total) by mouth 2 (two) times daily. 60 tablet 1  . tadalafil (CIALIS) 20 MG tablet Take 1 tablet (20 mg total) by mouth daily as needed for erectile dysfunction. 8 tablet 0  . tamsulosin (FLOMAX) 0.4 MG CAPS capsule Take 1 capsule (0.4 mg total) by mouth daily. For prostate 30 capsule 11  . VIAGRA 100 MG tablet TAKE 1/2 TO 1 TABLET DAILY AS NEEDED 6 tablet 6  . Vitamin D, Ergocalciferol, (DRISDOL) 50000 UNITS CAPS capsule TAKE 1 CAPSULE DAILY 30 capsule 99   No current facility-administered medications on file prior to visit.   Medical History:  Past Medical History  Diagnosis Date  . Hypertension   . Hyperlipidemia   . Pre-diabetes   . GERD (gastroesophageal reflux disease)   . Vitamin D deficiency   . TIA (transient ischemic attack)   .  Stroke ([place])   . ED (erectile dysfunction)    Allergies:  Allergies  Allergen Reactions  . Sulfa Antibiotics Nausea Only    Review of Systems  Constitutional: Negative.  Negative for fever and chills.  HENT: Negative.   Eyes: Negative.   Respiratory: Negative.   Cardiovascular: Negative.   Gastrointestinal: Negative.   Genitourinary: Positive for urgency, frequency and flank pain. Negative for dysuria and hematuria.  Musculoskeletal: Positive for back pain. Negative for myalgias, joint pain, falls and neck pain.  Skin: Negative.   Neurological: Negative.   Psychiatric/Behavioral:  Negative.      Family history- Review and unchanged Social history- Review and unchanged Physical Exam: BP 120/70 mmHg  Pulse 60  Temp(Src) 97.9 F (36.6 C) (Temporal)  Resp 14  Ht 5\' 11"  (1.803 m)  Wt 203 lb (92.08 kg)  BMI 28.33 kg/m2  SpO2 95% Wt Readings from Last 3 Encounters:  10/03/15 203 lb (92.08 kg)  07/03/15 202 lb 3.2 oz (91.717 kg)  03/30/15 206 lb (93.441 kg)   General Appearance: Well nourished, in no apparent distress. Eyes: PERRLA, EOMs, conjunctiva no swelling or erythema Sinuses: No Frontal/maxillary tenderness ENT/Mouth: Ext aud canals clear, TMs without erythema, bulging. No erythema, swelling, or exudate on post pharynx.  Tonsils not swollen or erythematous. Hearing normal.  Neck: Supple, thyroid normal.  Respiratory: Respiratory effort normal, BS equal bilaterally without rales, rhonchi, wheezing or stridor.  Cardio: RRR with no MRGs. Brisk peripheral pulses without edema.  Abdomen: Soft, + BS.  Non tender, no guarding, rebound, hernias, masses.+ CVA tenderness Lymphatics: Non tender without lymphadenopathy.  Musculoskeletal: Full ROM, 5/5 strength, normal gait. Patient is able to ambulate well. Gait is not  Antalgic. Straight leg raising with dorsiflexion negative bilaterally for radicular symptoms. Sensory exam in the legs are normal. Knee reflexes are normal Ankle reflexes are normal Strength is normal and symmetric in arms and legs. There is not SI tenderness to palpation.  There isparaspinal muscle spasm.  There is midline tenderness.  ROM of spine with  limited in all spheres due to pain.  Skin: Warm, dry without rashes, lesions, ecchymosis.  Neuro: Cranial nerves intact. Normal muscle tone, no cerebellar symptoms. Sensation intact.  Psych: Awake and oriented X 3, normal affect, Insight and Judgment appropriate.    Vicie Mutters 3:56 PM

## 2015-10-04 ENCOUNTER — Ambulatory Visit: Payer: Self-pay | Admitting: Physician Assistant

## 2015-10-04 LAB — URINALYSIS, ROUTINE W REFLEX MICROSCOPIC
BILIRUBIN URINE: NEGATIVE
GLUCOSE, UA: NEGATIVE
Hgb urine dipstick: NEGATIVE
Ketones, ur: NEGATIVE
Leukocytes, UA: NEGATIVE
Nitrite: NEGATIVE
Protein, ur: NEGATIVE
SPECIFIC GRAVITY, URINE: 1.023 (ref 1.001–1.035)
pH: 7.5 (ref 5.0–8.0)

## 2015-10-04 LAB — TSH: TSH: 1.197 u[IU]/mL (ref 0.350–4.500)

## 2015-10-04 LAB — VITAMIN D 25 HYDROXY (VIT D DEFICIENCY, FRACTURES): Vit D, 25-Hydroxy: 116 ng/mL — ABNORMAL HIGH (ref 30–100)

## 2015-10-05 LAB — URINE CULTURE
Colony Count: NO GROWTH
Organism ID, Bacteria: NO GROWTH

## 2015-10-20 ENCOUNTER — Other Ambulatory Visit: Payer: Self-pay | Admitting: Internal Medicine

## 2015-11-03 ENCOUNTER — Other Ambulatory Visit: Payer: Self-pay | Admitting: Internal Medicine

## 2015-11-20 ENCOUNTER — Other Ambulatory Visit: Payer: Self-pay

## 2015-11-20 MED ORDER — TAMSULOSIN HCL 0.4 MG PO CAPS: 0.4000 mg | ORAL_CAPSULE | Freq: Every day | ORAL | Status: DC

## 2015-11-20 MED ORDER — VITAMIN D (ERGOCALCIFEROL) 1.25 MG (50000 UNIT) PO CAPS: 50000.0000 [IU] | ORAL_CAPSULE | Freq: Every day | ORAL | Status: DC

## 2015-11-20 MED ORDER — MELOXICAM 15 MG PO TABS: 15.0000 mg | ORAL_TABLET | Freq: Every day | ORAL | Status: DC

## 2015-11-20 MED ORDER — HYDROCHLOROTHIAZIDE 25 MG PO TABS: ORAL_TABLET | ORAL | Status: DC

## 2015-11-30 ENCOUNTER — Encounter: Payer: Self-pay | Admitting: Physician Assistant

## 2015-11-30 ENCOUNTER — Ambulatory Visit (INDEPENDENT_AMBULATORY_CARE_PROVIDER_SITE_OTHER): Payer: BLUE CROSS/BLUE SHIELD | Admitting: Physician Assistant

## 2015-11-30 VITALS — BP 120/80 | HR 72 | Temp 97.7°F | Resp 16 | Ht 71.0 in | Wt 202.0 lb

## 2015-11-30 DIAGNOSIS — J320 Chronic maxillary sinusitis: Secondary | ICD-10-CM

## 2015-11-30 MED ORDER — PREDNISONE 20 MG PO TABS: ORAL_TABLET | ORAL | Status: DC

## 2015-11-30 MED ORDER — AZITHROMYCIN 250 MG PO TABS: ORAL_TABLET | ORAL | Status: AC

## 2015-11-30 NOTE — Progress Notes (Signed)
Subjective:    Patient ID: Steven Bray, male    DOB: June 02, 1951, 65 y.o.   MRN: [id number]  HPI 65 y.o. WM with ear pain x 2-3 weeks, right worse than left. Denies fever, chills, no OTC meds. Some right sinus pain with congestion, no cough, CP, wheezing.   Blood pressure 120/80, pulse 72, temperature 97.7 F (36.5 C), temperature source Temporal, resp. rate 16, height 5\' 11"  (1.803 m), weight 202 lb (91.627 kg), SpO2 93 %.  Past Medical History  Diagnosis Date  . Hypertension   . Hyperlipidemia   . Pre-diabetes   . GERD (gastroesophageal reflux disease)   . Vitamin D deficiency   . TIA (transient ischemic attack)   . Stroke ([place])   . ED (erectile dysfunction)    Current Outpatient Prescriptions on File Prior to Visit  Medication Sig Dispense Refill  . aspirin 81 MG tablet Take 81 mg by mouth daily.    Marland Kitchen atenolol (TENORMIN) 100 MG tablet TAKE 1 TABLET EVERY DAY 90 tablet 1  . atorvastatin (LIPITOR) 80 MG tablet TAKE 1 TABLET EVERY DAY FOR CHOLESTEROL 90 tablet 0  . hydrochlorothiazide (HYDRODIURIL) 25 MG tablet TAKE 1/2 TO 1 TABLET EVERY MORNING AS NEEDED FOR BLOOD PRESSURE 90 tablet 1  . meloxicam (MOBIC) 15 MG tablet Take 1 tablet (15 mg total) by mouth daily. TAKE HALF TO ONE TABLET DAILY WITH FOOD FOR PAIN & INFLAMMATION. 90 tablet 0  . ranitidine (ZANTAC) 150 MG tablet Take 1 tablet (150 mg total) by mouth 2 (two) times daily. 60 tablet 1  . tadalafil (CIALIS) 20 MG tablet Take 1 tablet (20 mg total) by mouth daily as needed for erectile dysfunction. 8 tablet 0  . tamsulosin (FLOMAX) 0.4 MG CAPS capsule Take 1 capsule (0.4 mg total) by mouth daily. 90 capsule 1  . Vitamin D, Ergocalciferol, (DRISDOL) 50000 units CAPS capsule Take 1 capsule (50,000 Units total) by mouth daily. 90 capsule 1   No current facility-administered medications on file prior to visit.   Review of Systems  Constitutional: Negative for fever, chills, activity change, appetite change and fatigue.   HENT: Positive for congestion, ear pain, hearing loss, postnasal drip, rhinorrhea and sinus pressure (right side). Negative for dental problem, drooling, ear discharge, facial swelling, mouth sores, nosebleeds, sneezing, sore throat, tinnitus, trouble swallowing and voice change.   Respiratory: Negative.   Cardiovascular: Negative.   Gastrointestinal: Negative.        Objective:   Physical Exam  Constitutional: He is oriented to person, place, and time. He appears well-developed and well-nourished.  HENT:  Head: Normocephalic and atraumatic.  Right Ear: Hearing normal. No mastoid tenderness. Tympanic membrane is erythematous and bulging. A middle ear effusion is present.  Left Ear: Hearing and tympanic membrane normal.  Nose: Right sinus exhibits maxillary sinus tenderness. Right sinus exhibits no frontal sinus tenderness. Left sinus exhibits no maxillary sinus tenderness and no frontal sinus tenderness.  Mouth/Throat: Uvula is midline and mucous membranes are normal. No oropharyngeal exudate, posterior oropharyngeal edema, posterior oropharyngeal erythema or tonsillar abscesses.  Eyes: Conjunctivae are normal. Pupils are equal, round, and reactive to light.  Neck: Normal range of motion. Neck supple.  Cardiovascular: Normal rate and regular rhythm.   Pulmonary/Chest: Effort normal and breath sounds normal.  Abdominal: Soft. Bowel sounds are normal.  Musculoskeletal: Normal range of motion.  Lymphadenopathy:    He has no cervical adenopathy.  Neurological: He is alert and oriented to person, place, and time.  Skin:  Skin is warm and dry. No rash noted.       Assessment & Plan:  Sinus infection/otitis media Zpak, prednisone Versus TMJ given information

## 2015-11-30 NOTE — Patient Instructions (Signed)
Sinusitis can be uncomfortable. People with sinusitis have congestion with yellow/green/gray discharge, sinus pain/pressure, pain around the eyes. Sinus infections almost ALWAYS stem from a viral infection and antibiotics don't work against a virus. Even when bacteria is responsible, the infections usually clear up on their own in a week or so.   PLEASE TRY TO DO OVER THE COUNTER TREATMENT AND PREDNISONE FOR 5-7 DAYS AND IF YOU ARE NOT GETTING BETTER OR GETTING WORSE THEN YOU CAN START ON AN ANTIBIOTIC GIVEN.  Can take the prednisone AT NIGHT WITH DINNER, it take 8-12 hours to start working so it will NOT affect your sleeping if you take it at night with your food!! Take two pills the first night and 1 or two pill the second night and then 1 pill the other nights.   Risk of antibiotic use: About 1 in 4 people who take antibiotics have side effects including stomach problems, dizziness, or rashes. Those problems clear up soon after stopping the drugs, but in rare cases antibiotics can cause severe allergic reaction. Over use of antibiotics also encourages the growth of bacteria that can't be controlled easily with drugs. That makes you more vunerable to antibiotic-resistant infections and undermines the benefits of antibiotics for others.   Waste of Money: Antibiotics often aren't very expensive, but any money spent on unnecessary drugs is money down the drain.   When are antibiotics needed? Only when symptoms last longer than a week.  Start to improve but then worsen again  -It can take up to 2 weeks to feel better.   -If you do not get better in 7-10 days (Have fever, facial pain, dental pain and swelling), then please call the office and it is now appropriate to start an antibiotic.   -Please take Tylenol or Ibuprofen for pain. -Acetaminiphen 325mg orally every 4-6 hours for pain.  Max: 10 per day -Ibuprofen 200mg orally every 6-8 hours for pain.  Take with food to avoid ulcers.   Max 10 per  day  Please pick one of the over the counter allergy medications below and take it once daily for allergies.  Claritin or loratadine cheapest but likely the weakest  Zyrtec or certizine at night because it can make you sleepy The strongest is allegra or fexafinadine  Cheapest at [facility], [employer], [employer]  -While drinking fluids, pinch and hold nose close and swallow.  This will help open up your eustachian tubes to drain the fluid behind your ear drums. -Try steam showers to open your nasal passages.   Drink lots of water to stay hydrated and to thin mucous.  Flonase/Nasonex is to help the inflammation.  Take 2 sprays in each nostril at bedtime.  Make sure you spray towards the outside of each nostril towards the outer corner of your eye, hold nose close and tilt head back.  This will help the medication get into your sinuses.  If you do not like this medication, then use saline nasal sprays same directions as above for Flonase. Stop the medication right away if you get blurring of your vision or nose bleeds.  Sinusitis Sinusitis is redness, soreness, and inflammation of the paranasal sinuses. Paranasal sinuses are air pockets within the bones of your face (beneath the eyes, the middle of the forehead, or above the eyes). In healthy paranasal sinuses, mucus is able to drain out, and air is able to circulate through them by way of your nose. However, when your paranasal sinuses are inflamed, mucus and air can   become trapped. This can allow bacteria and other germs to grow and cause infection. Sinusitis can develop quickly and last only a short time (acute) or continue over a long period (chronic). Sinusitis that lasts for more than 12 weeks is considered chronic.  CAUSES  Causes of sinusitis include: Allergies. Structural abnormalities, such as displacement of the cartilage that separates your nostrils (deviated septum), which can decrease the air flow through your nose and sinuses and affect sinus  drainage. Functional abnormalities, such as when the small hairs (cilia) that line your sinuses and help remove mucus do not work properly or are not present. SIGNS AND SYMPTOMS  Symptoms of acute and chronic sinusitis are the same. The primary symptoms are pain and pressure around the affected sinuses. Other symptoms include: Upper toothache. Earache. Headache. Bad breath. Decreased sense of smell and taste. A cough, which worsens when you are lying flat. Fatigue. Fever. Thick drainage from your nose, which often is green and may contain pus (purulent). Swelling and warmth over the affected sinuses. DIAGNOSIS  Your health care provider will perform a physical exam. During the exam, your health care provider may: Look in your nose for signs of abnormal growths in your nostrils (nasal polyps).  Tap over the affected sinus to check for signs of infection. View the inside of your sinuses (endoscopy) using an imaging device that has a light attached (endoscope). If your health care provider suspects that you have chronic sinusitis, one or more of the following tests may be recommended: Allergy tests. Nasal culture. A sample of mucus is taken from your nose, sent to a lab, and screened for bacteria. Nasal cytology. A sample of mucus is taken from your nose and examined by your health care provider to determine if your sinusitis is related to an allergy. TREATMENT  Most cases of acute sinusitis are related to a viral infection and will resolve on their own within 10 days. Sometimes medicines are prescribed to help relieve symptoms (pain medicine, decongestants, nasal steroid sprays, or saline sprays).  However, for sinusitis related to a bacterial infection, your health care provider will prescribe antibiotic medicines. These are medicines that will help kill the bacteria causing the infection.  Rarely, sinusitis is caused by a fungal infection. In theses cases, your health care provider will  prescribe antifungal medicine. For some cases of chronic sinusitis, surgery is needed. Generally, these are cases in which sinusitis recurs more than 3 times per year, despite other treatments. HOME CARE INSTRUCTIONS  Drink plenty of water. Water helps thin the mucus so your sinuses can drain more easily. Use a humidifier. Inhale steam 3 to 4 times a day (for example, sit in the bathroom with the shower running). Apply a warm, moist washcloth to your face 3 to 4 times a day, or as directed by your health care provider. Use saline nasal sprays to help moisten and clean your sinuses. Take medicines only as directed by your health care provider. If you were prescribed either an antibiotic or antifungal medicine, finish it all even if you start to feel better. SEEK IMMEDIATE MEDICAL CARE IF: You have increasing pain or severe headaches. You have nausea, vomiting, or drowsiness. You have swelling around your face. You have vision problems. You have a stiff neck. You have difficulty breathing. MAKE SURE YOU:  Understand these instructions. Will watch your condition. Will get help right away if you are not doing well or get worse. Document Released: 10/28/2005 Document Revised: 03/14/2014 Document Reviewed: 11/12/2011 ExitCare   Patient Information 2015 [place]. This information is not intended to replace advice given to you by your health care provider. Make sure you discuss any questions you have with your health care provider.    What is the TMJ? The temporomandibular (tem-PUH-ro-man-DIB-yoo-ler) joint, or the TMJ, connects the upper and lower jawbones. This joint allows the jaw to open wide and move back and forth when you chew, talk, or yawn.There are also several muscles that help this joint move. There can be muscle tightness and pain in the muscle that can cause several symptoms.  What causes TMJ pain? There are many causes of TMJ pain. Repeated chewing (for example, chewing gum)  and clenching your teeth can cause pain in the joint. Some TMJ pain has no obvious cause. What can I do to ease the pain? There are many things you can do to help your pain get better. When you have pain:  Eat soft foods and stay away from chewy foods (for example, taffy) Try to use both sides of your mouth to chew Don't chew gum Massage Don't open your mouth wide (for example, during yawning or singing) Don't bite your cheeks or fingernails Lower your amount of stress and worry Applying a warm, damp washcloth to the joint may help. Over-the-counter pain medicines such as ibuprofen (one brand: Advil) or acetaminophen (one brand: Tylenol) might also help. Do not use these medicines if you are allergic to them or if your doctor told you not to use them. How can I stop the pain from coming back? When your pain is better, you can do these exercises to make your muscles stronger and to keep the pain from coming back:  Resisted mouth opening: Place your thumb or two fingers under your chin and open your mouth slowly, pushing up lightly on your chin with your thumb. Hold for three to six seconds. Close your mouth slowly. Resisted mouth closing: Place your thumbs under your chin and your two index fingers on the ridge between your mouth and the bottom of your chin. Push down lightly on your chin as you close your mouth. Tongue up: Slowly open and close your mouth while keeping the tongue touching the roof of the mouth. Side-to-side jaw movement: Place an object about one fourth of an inch thick (for example, two tongue depressors) between your front teeth. Slowly move your jaw from side to side. Increase the thickness of the object as the exercise becomes easier Forward jaw movement: Place an object about one fourth of an inch thick between your front teeth and move the bottom jaw forward so that the bottom teeth are in front of the top teeth. Increase the thickness of the object as the exercise becomes  easier. These exercises should not be painful. If it hurts to do these exercises, stop doing them and talk to your family doctor.

## 2015-12-12 ENCOUNTER — Ambulatory Visit (INDEPENDENT_AMBULATORY_CARE_PROVIDER_SITE_OTHER): Payer: BLUE CROSS/BLUE SHIELD | Admitting: Physician Assistant

## 2015-12-12 VITALS — BP 130/90 | HR 66 | Temp 97.9°F | Resp 16 | Ht 71.0 in | Wt 205.0 lb

## 2015-12-12 DIAGNOSIS — H9203 Otalgia, bilateral: Secondary | ICD-10-CM | POA: Diagnosis not present

## 2015-12-12 MED ORDER — PREDNISONE 20 MG PO TABS: ORAL_TABLET | ORAL | Status: DC

## 2015-12-12 MED ORDER — FLUTICASONE PROPIONATE 50 MCG/ACT NA SUSP: 2.0000 | Freq: Every day | NASAL | Status: DC

## 2015-12-12 NOTE — Patient Instructions (Signed)
Your ears and sinuses are connected by the eustachian tube. When your sinuses are inflamed, this can close off the tube and cause fluid to collect in your middle ear. This can then cause dizziness, popping, clicking, ringing, and echoing in your ears. This is often NOT an infection and does NOT require antibiotics, it is caused by inflammation so the treatments help the inflammation. This can take a long time to get better so please be patient.  Here are things you can do to help with this: - Try the Flonase or Nasonex. Remember to spray each nostril twice towards the outer part of your eye.  Do not sniff but instead pinch your nose and tilt your head back to help the medicine get into your sinuses.  The best time to do this is at bedtime.Stop if you get blurred vision or nose bleeds.  -While drinking fluids, pinch and hold nose close and swallow, to help open eustachian tubes to drain fluid behind ear drums. -Please pick one of the over the counter allergy medications below and take it once daily for allergies.  It will also help with fluid behind ear drums. Claritin or loratadine cheapest but likely the weakest  Zyrtec or certizine at night because it can make you sleepy The strongest is allegra or fexafinadine  Cheapest at [facility], [employer], [employer] -can use decongestant over the counter, please do not use if you have high blood pressure or certain heart conditions.   if worsening HA, changes vision/speech, imbalance, weakness go to the ER  If it is not better we will refer you to ENT   What is the TMJ? The temporomandibular (tem-PUH-ro-man-DIB-yoo-ler) joint, or the TMJ, connects the upper and lower jawbones. This joint allows the jaw to open wide and move back and forth when you chew, talk, or yawn.There are also several muscles that help this joint move. There can be muscle tightness and pain in the muscle that can cause several symptoms.  What causes TMJ pain? There are many causes of TMJ  pain. Repeated chewing (for example, chewing gum) and clenching your teeth can cause pain in the joint. Some TMJ pain has no obvious cause. What can I do to ease the pain? There are many things you can do to help your pain get better. When you have pain:  Eat soft foods and stay away from chewy foods (for example, taffy) Try to use both sides of your mouth to chew Don't chew gum Massage Don't open your mouth wide (for example, during yawning or singing) Don't bite your cheeks or fingernails Lower your amount of stress and worry Applying a warm, damp washcloth to the joint may help. Over-the-counter pain medicines such as ibuprofen (one brand: Advil) or acetaminophen (one brand: Tylenol) might also help. Do not use these medicines if you are allergic to them or if your doctor told you not to use them. How can I stop the pain from coming back? When your pain is better, you can do these exercises to make your muscles stronger and to keep the pain from coming back:  Resisted mouth opening: Place your thumb or two fingers under your chin and open your mouth slowly, pushing up lightly on your chin with your thumb. Hold for three to six seconds. Close your mouth slowly. Resisted mouth closing: Place your thumbs under your chin and your two index fingers on the ridge between your mouth and the bottom of your chin. Push down lightly on your chin as you  close your mouth. Tongue up: Slowly open and close your mouth while keeping the tongue touching the roof of the mouth. Side-to-side jaw movement: Place an object about one fourth of an inch thick (for example, two tongue depressors) between your front teeth. Slowly move your jaw from side to side. Increase the thickness of the object as the exercise becomes easier Forward jaw movement: Place an object about one fourth of an inch thick between your front teeth and move the bottom jaw forward so that the bottom teeth are in front of the top teeth. Increase the  thickness of the object as the exercise becomes easier. These exercises should not be painful. If it hurts to do these exercises, stop doing them and talk to your family doctor.

## 2015-12-12 NOTE — Progress Notes (Signed)
   Subjective:    Patient ID: Steven Bray, male    DOB: 1951-10-22, 65 y.o.   MRN: [id number]  HPI 65 y.o. WM presents with ear pain bilateral, right worse than left, treated with zpak, prednisone. Sinus pressure is better. Does have some popping/clicking jaw.   Blood pressure 130/90, pulse 66, temperature 97.9 F (36.6 C), temperature source Temporal, resp. rate 16, height 5\' 11"  (1.803 m), weight 205 lb (92.987 kg), SpO2 98 %.   Review of Systems  Constitutional: Negative for fever, chills, activity change, appetite change and fatigue.  HENT: Positive for ear pain and hearing loss. Negative for congestion, dental problem, drooling, ear discharge, facial swelling, mouth sores, nosebleeds, postnasal drip, rhinorrhea, sinus pressure, sneezing, sore throat, tinnitus, trouble swallowing and voice change.   Respiratory: Negative.   Cardiovascular: Negative.   Gastrointestinal: Negative.        Objective:   Physical Exam  Constitutional: He is oriented to person, place, and time. He appears well-developed and well-nourished.  HENT:  Head: Normocephalic and atraumatic.  Right Ear: Hearing normal. No mastoid tenderness. Tympanic membrane is not erythematous and not bulging. A middle ear effusion is present.  Left Ear: Hearing normal. A middle ear effusion is present.  Nose: Right sinus exhibits no maxillary sinus tenderness and no frontal sinus tenderness. Left sinus exhibits no maxillary sinus tenderness and no frontal sinus tenderness.  Mouth/Throat: Uvula is midline and mucous membranes are normal. No oropharyngeal exudate, posterior oropharyngeal edema, posterior oropharyngeal erythema or tonsillar abscesses.  + TMJ tenderness bilateral, no palpable abscess  Eyes: Conjunctivae are normal. Pupils are equal, round, and reactive to light.  Neck: Normal range of motion. Neck supple.  Cardiovascular: Normal rate and regular rhythm.   Pulmonary/Chest: Effort normal and breath sounds  normal.  Abdominal: Soft. Bowel sounds are normal.  Musculoskeletal: Normal range of motion.  Lymphadenopathy:       Head (right side): No submental, no submandibular, no tonsillar, no preauricular and no posterior auricular adenopathy present.       Head (left side): No submental, no submandibular, no tonsillar, no preauricular and no posterior auricular adenopathy present.    He has no cervical adenopathy.       Right cervical: No superficial cervical, no deep cervical and no posterior cervical adenopathy present.      Left cervical: No superficial cervical, no deep cervical and no posterior cervical adenopathy present.  Neurological: He is alert and oriented to person, place, and time.  Skin: Skin is warm and dry. No rash noted.       Assessment & Plan:  ? TMJ versus effusion Will try prednisone again, TMJ information given, flonase, if not better will send ENT

## 2015-12-18 ENCOUNTER — Other Ambulatory Visit: Payer: Self-pay | Admitting: Physician Assistant

## 2016-01-03 ENCOUNTER — Encounter: Payer: Self-pay | Admitting: Internal Medicine

## 2016-01-09 ENCOUNTER — Ambulatory Visit: Payer: Self-pay | Admitting: Internal Medicine

## 2016-01-11 ENCOUNTER — Encounter: Payer: Self-pay | Admitting: Internal Medicine

## 2016-01-11 ENCOUNTER — Ambulatory Visit (INDEPENDENT_AMBULATORY_CARE_PROVIDER_SITE_OTHER): Payer: Self-pay | Admitting: Internal Medicine

## 2016-01-11 VITALS — BP 110/72 | HR 72 | Temp 97.5°F | Resp 16 | Ht 71.0 in | Wt 206.2 lb

## 2016-01-11 DIAGNOSIS — E559 Vitamin D deficiency, unspecified: Secondary | ICD-10-CM

## 2016-01-11 DIAGNOSIS — I10 Essential (primary) hypertension: Secondary | ICD-10-CM

## 2016-01-11 DIAGNOSIS — K219 Gastro-esophageal reflux disease without esophagitis: Secondary | ICD-10-CM | POA: Diagnosis not present

## 2016-01-11 DIAGNOSIS — Z0001 Encounter for general adult medical examination with abnormal findings: Secondary | ICD-10-CM

## 2016-01-11 DIAGNOSIS — R5383 Other fatigue: Secondary | ICD-10-CM | POA: Diagnosis not present

## 2016-01-11 DIAGNOSIS — R7303 Prediabetes: Secondary | ICD-10-CM | POA: Diagnosis not present

## 2016-01-11 DIAGNOSIS — Z111 Encounter for screening for respiratory tuberculosis: Secondary | ICD-10-CM | POA: Diagnosis not present

## 2016-01-11 DIAGNOSIS — R6889 Other general symptoms and signs: Secondary | ICD-10-CM | POA: Diagnosis not present

## 2016-01-11 DIAGNOSIS — Z79899 Other long term (current) drug therapy: Secondary | ICD-10-CM | POA: Diagnosis not present

## 2016-01-11 DIAGNOSIS — Z125 Encounter for screening for malignant neoplasm of prostate: Secondary | ICD-10-CM | POA: Diagnosis not present

## 2016-01-11 DIAGNOSIS — E785 Hyperlipidemia, unspecified: Secondary | ICD-10-CM | POA: Diagnosis not present

## 2016-01-11 DIAGNOSIS — Z1212 Encounter for screening for malignant neoplasm of rectum: Secondary | ICD-10-CM

## 2016-01-11 NOTE — Patient Instructions (Signed)

## 2016-01-11 NOTE — Progress Notes (Signed)
Patient ID: Steven Bray, male   DOB: 09-21-1951, 65 y.o.   MRN: [id number]  Annual  Screening/Preventative Visit And Comprehensive Evaluation & Examination  This very nice 65 y.o.male presents for a Wellness/Preventative Visit & comprehensive evaluation and management of multiple medical co-morbidities.  Patient has been followed for HTN, T2_NIDDM  Prediabetes, Hyperlipidemia and Vitamin D Deficiency.   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 Patient's BP has been controlled at home.Today's BP: 110/72 mmHg. Patient denies any cardiac symptoms as chest pain, palpitations, shortness of breath, dizziness or ankle swelling.   Patient's hyperlipidemia is controlled with diet and medications. Patient denies myalgias or other medication SE's. Last lipids were at goal with Cholesterol 110*; HDL 34*; LDL 59; Triglycerides 87 on 10/03/2015.   Patient has prediabetes since 2013 with A1c 5.7% and patient denies reactive hypoglycemic symptoms, visual blurring, diabetic polys or paresthesias. Last A1c was  5.6% on 10/03/2015.    Finally, patient has history of Vitamin D Deficiency of "78" in 2008 and last vitamin D was sl elevated at 116 on 10/03/2015 and dose was tapered.   Medication Sig  . aspirin 81 MG tablet Take 81 mg by mouth daily.  Marland Kitchen atenolol 100 MG tablet TAKE 1 TABLET EVERY DAY  . atorvastatin  80 MG tablet TAKE 1 TABLET EVERY DAY FOR CHOLESTEROL  . fFLONASE nasal spray Place 2 sprays into both nostrils at bedtime.  . hydrochlorothiazide  25 MG tablet TAKE 1/2 TO 1 TABLET EVERY MORNING AS NEEDED FOR BLOOD PRESSURE  . meloxicam  15 MG tablet Take 1 tablet (15 mg total) by mouth daily. TAKE HALF TO ONE TABLET DAILY   . ranitidine 150 MG tablet Take 1 tablet (150 mg total) by mouth 2 (two) times daily.  . tadalafil (CIALIS) 20 MG tablet Take 1 tablet (20 mg total) by mouth daily as needed for erectile dysfunction.  . tamsulosin  0.4 MG  CAPS capsule Take 1 capsule (0.4 mg total) by mouth daily.  . Vitamin D 50,000 units  Take 1 capsule (50,000 Units total) by mouth daily.   Allergies  Allergen Reactions  . Sulfa Antibiotics Nausea Only   Past Medical History  Diagnosis Date  . Hypertension   . Hyperlipidemia   . Pre-diabetes   . GERD (gastroesophageal reflux disease)   . Vitamin D deficiency   . TIA (transient ischemic attack)   . Stroke ([place])   . ED (erectile dysfunction)    Health Maintenance  Topic Date Due  . ZOSTAVAX  08/07/2013  . INFLUENZA VACCINE  06/12/2015  . COLONOSCOPY  12/09/2022  . TETANUS/TDAP  12/14/2024  . Hepatitis C Screening  Completed  . HIV Screening  Completed   Immunization History  Administered Date(s) Administered  . PPD Test 12/13/2013, 12/14/2014, 01/11/2016  . Pneumococcal-Unspecified 11/24/2008  . Td 11/11/2004  . Tdap 12/14/2014   Past Surgical History  Procedure Laterality Date  . Hernia repair Right     INGUINAL   Family History  Problem Relation Age of Onset  . Cancer Mother     LYMPHOMA  . Hyperlipidemia Mother   . Hypertension Mother   . Diabetes Father   . Hypertension Father   . Stroke Father   . Kidney disease Father   . Diabetes Sister   . Early death Daughter   . Drug abuse Son     DIED FROM OD   Social History   Social History  .  Marital Status: Single    Spouse Name: N/A  . Number of Children: N/A  . Years of Education: N/A   Occupational History  . Hotel manager   Social History Main Topics  . Smoking status: Current Every Day Smoker -- 0.50 packs/day for 42 years    Types: Cigarettes  . Smokeless tobacco: Not on file     Comment: trying to quit and smoking 2 to 5 cigarettes a day.  . Alcohol Use: No     Comment: OCC  . Drug Use: No  . Sexual Activity: Yes    Birth Control/ Protection: None   Other Topics Concern  . Not on file   Social History Narrative    ROS Constitutional: Denies fever, chills, weight loss/gain,  headaches, insomnia,  night sweats or change in appetite. Does c/o fatigue. Eyes: Denies redness, blurred vision, diplopia, discharge, itchy or watery eyes.  ENT: Denies discharge, congestion, post nasal drip, epistaxis, sore throat, earache, hearing loss, dental pain, Tinnitus, Vertigo, Sinus pain or snoring.  Cardio: Denies chest pain, palpitations, irregular heartbeat, syncope, dyspnea, diaphoresis, orthopnea, PND, claudication or edema Respiratory: denies cough, dyspnea, DOE, pleurisy, hoarseness, laryngitis or wheezing.  Gastrointestinal: Denies dysphagia, heartburn, reflux, water brash, pain, cramps, nausea, vomiting, bloating, diarrhea, constipation, hematemesis, melena, hematochezia, jaundice or hemorrhoids Genitourinary: Denies dysuria, frequency, urgency, nocturia, hesitancy, discharge, hematuria or flank pain Musculoskeletal: Denies arthralgia, myalgia, stiffness, Jt. Swelling, pain, limp or strain/sprain. Denies Falls. Skin: Denies puritis, rash, hives, warts, acne, eczema or change in skin lesion Neuro: No weakness, tremor, incoordination, spasms, paresthesia or pain Psychiatric: Denies confusion, memory loss or sensory loss. Denies Depression. Endocrine: Denies change in weight, skin, hair change, nocturia, and paresthesia, diabetic polys, visual blurring or hyper / hypo glycemic episodes.  Heme/Lymph: No excessive bleeding, bruising or enlarged lymph nodes.  Physical Exam  BP 110/72 mmHg  Pulse 72  Temp(Src) 97.5 F (36.4 C)  Resp 16  Ht 5\' 11"  (1.803 m)  Wt 206 lb 3.2 oz (93.532 kg)  BMI 28.77 kg/m2  General Appearance: Well nourished, in no apparent distress. Eyes: PERRLA, EOMs, conjunctiva no swelling or erythema, normal fundi and vessels. Sinuses: No frontal/maxillary tenderness ENT/Mouth: EACs patent / TMs  nl. Nares clear without erythema, swelling, mucoid exudates. Oral hygiene is good. No erythema, swelling, or exudate. Tongue normal, non-obstructing. Tonsils not  swollen or erythematous. Hearing normal.  Neck: Supple, thyroid normal. No bruits, nodes or JVD. Respiratory: Respiratory effort normal.  BS equal and clear bilateral without rales, rhonci, wheezing or stridor. Cardio: Heart sounds are normal with regular rate and rhythm and no murmurs, rubs or gallops. Peripheral pulses are normal and equal bilaterally without edema. No aortic or femoral bruits. Chest: symmetric with normal excursions and percussion.  Abdomen: Soft, with Nl bowel sounds. Nontender, no guarding, rebound, hernias, masses, or organomegaly.  Lymphatics: Non tender without lymphadenopathy.  Genitourinary: No hernias.Testes nl. DRE - prostate nl for age - smooth & firm w/o nodules. Musculoskeletal: Full ROM all peripheral extremities, joint stability, 5/5 strength, and normal gait. Skin: Warm and dry without rashes, lesions, cyanosis, clubbing or  ecchymosis.  Neuro: Cranial nerves intact, reflexes equal bilaterally. Normal muscle tone, no cerebellar symptoms. Sensation intact.  Pysch: Alert and oriented X 3 with normal affect, insight and judgment appropriate.   Assessment and Plan  1. Annual Preventative/Screening Exam   - EKG 12-Lead - [place], RETROPERITNL ABD,  LTD - POC Hemoccult Bld/Stl  - Urinalysis, Routine w reflex microscopic - Vitamin B12 -  Iron and TIBC - PSA - Testosterone - CBC with Differential/Platelet - BASIC METABOLIC PANEL WITH GFR - Hepatic function panel - Magnesium - Lipid panel - TSH - Hemoglobin A1c - Insulin, random - VITAMIN D 25 Hydroxy (Vit-D Deficiency, Fractures) - Microalbumin / creatinine urine ratio  2. Essential hypertension  - EKG 12-Lead - [place], RETROPERITNL ABD,  LTD - TSH - Microalbumin / creatinine urine ratio  3. Hyperlipidemia  - Lipid panel - TSH  4. Pre-diabetes  - Hemoglobin A1c - Insulin, random  5. Vitamin D deficiency  - VITAMIN D 25 Hydroxy   6. Gastroesophageal reflux disease   7. Screening for rectal  cancer  - POC Hemoccult Bld/Stl   8. Prostate cancer screening  - PSA  9. Screening examination for pulmonary tuberculosis  - PPD  10. Other fatigue  - Vitamin B12 - Iron and TIBC - Testosterone - CBC with Differential/Platelet - TSH  11. Medication management  - Urinalysis, Routine w reflex microscopic  - CBC with Differential/Platelet - BASIC METABOLIC PANEL WITH GFR - Hepatic function panel - Magnesium   Continue prudent diet as discussed, weight control, BP monitoring, regular exercise, and medications as discussed.  Discussed med effects and SE's. Routine screening labs and tests as requested with regular follow-up as recommended. Over 40 minutes of exam, counseling, chart review and high complex critical decision making was performed

## 2016-01-12 LAB — URINALYSIS, ROUTINE W REFLEX MICROSCOPIC
BILIRUBIN URINE: NEGATIVE
GLUCOSE, UA: NEGATIVE
Ketones, ur: NEGATIVE
LEUKOCYTES UA: NEGATIVE
Nitrite: NEGATIVE
PH: 6.5 (ref 5.0–8.0)
Protein, ur: NEGATIVE
Specific Gravity, Urine: 1.022 (ref 1.001–1.035)

## 2016-01-12 LAB — MICROALBUMIN / CREATININE URINE RATIO
Creatinine, Urine: 185 mg/dL (ref 20–370)
MICROALB UR: 0.9 mg/dL
MICROALB/CREAT RATIO: 5 ug/mg{creat} (ref <30)

## 2016-01-12 LAB — BASIC METABOLIC PANEL WITH GFR
BUN: 20 mg/dL (ref 7–25)
CHLORIDE: 103 mmol/L (ref 98–110)
CO2: 28 mmol/L (ref 20–31)
Calcium: 9.1 mg/dL (ref 8.6–10.3)
Creat: 0.94 mg/dL (ref 0.70–1.25)
GFR, EST NON AFRICAN AMERICAN: 87 mL/min (ref >=60)
Glucose, Bld: 85 mg/dL (ref 65–99)
POTASSIUM: 3.9 mmol/L (ref 3.5–5.3)
SODIUM: 141 mmol/L (ref 135–146)

## 2016-01-12 LAB — URINALYSIS, MICROSCOPIC ONLY
BACTERIA UA: NONE SEEN [HPF]
CASTS: NONE SEEN [LPF]
CRYSTALS: NONE SEEN [HPF]
RBC / HPF: NONE SEEN RBC/HPF (ref <=2)
SQUAMOUS EPITHELIAL / LPF: NONE SEEN [HPF] (ref <=5)
WBC, UA: NONE SEEN WBC/HPF (ref <=5)
YEAST: NONE SEEN [HPF]

## 2016-01-12 LAB — HEPATIC FUNCTION PANEL
ALBUMIN: 4 g/dL (ref 3.6–5.1)
ALK PHOS: 55 U/L (ref 40–115)
ALT: 13 U/L (ref 9–46)
AST: 16 U/L (ref 10–35)
BILIRUBIN DIRECT: 0.1 mg/dL (ref <=0.2)
BILIRUBIN INDIRECT: 0.5 mg/dL (ref 0.2–1.2)
BILIRUBIN TOTAL: 0.6 mg/dL (ref 0.2–1.2)
Total Protein: 6.1 g/dL (ref 6.1–8.1)

## 2016-01-12 LAB — TESTOSTERONE: TESTOSTERONE: 436 ng/dL (ref 250–827)

## 2016-01-12 LAB — CBC WITH DIFFERENTIAL/PLATELET
BASOS ABS: 0 10*3/uL (ref 0.0–0.1)
BASOS PCT: 0 % (ref 0–1)
EOS ABS: 0.2 10*3/uL (ref 0.0–0.7)
EOS PCT: 3 % (ref 0–5)
HCT: 43.3 % (ref 39.0–52.0)
Hemoglobin: 14.4 g/dL (ref 13.0–17.0)
LYMPHS ABS: 1.4 10*3/uL (ref 0.7–4.0)
Lymphocytes Relative: 27 % (ref 12–46)
MCH: 32.3 pg (ref 26.0–34.0)
MCHC: 33.3 g/dL (ref 30.0–36.0)
MCV: 97.1 fL (ref 78.0–100.0)
MPV: 10.4 fL (ref 8.6–12.4)
Monocytes Absolute: 0.6 10*3/uL (ref 0.1–1.0)
Monocytes Relative: 11 % (ref 3–12)
NEUTROS PCT: 59 % (ref 43–77)
Neutro Abs: 3.1 10*3/uL (ref 1.7–7.7)
PLATELETS: 242 10*3/uL (ref 150–400)
RBC: 4.46 MIL/uL (ref 4.22–5.81)
RDW: 13.4 % (ref 11.5–15.5)
WBC: 5.2 10*3/uL (ref 4.0–10.5)

## 2016-01-12 LAB — HEMOGLOBIN A1C
Hgb A1c MFr Bld: 5.6 % (ref <5.7)
Mean Plasma Glucose: 114 mg/dL (ref <117)

## 2016-01-12 LAB — LIPID PANEL
CHOL/HDL RATIO: 3.4 ratio (ref <=5.0)
Cholesterol: 114 mg/dL — ABNORMAL LOW (ref 125–200)
HDL: 34 mg/dL — AB (ref >=40)
LDL CALC: 50 mg/dL (ref <130)
TRIGLYCERIDES: 149 mg/dL (ref <150)
VLDL: 30 mg/dL (ref <30)

## 2016-01-12 LAB — MAGNESIUM: Magnesium: 1.8 mg/dL (ref 1.5–2.5)

## 2016-01-12 LAB — IRON AND TIBC
%SAT: 42 % (ref 15–60)
Iron: 123 ug/dL (ref 50–180)
TIBC: 295 ug/dL (ref 250–425)
UIBC: 172 ug/dL (ref 125–400)

## 2016-01-12 LAB — VITAMIN B12: Vitamin B-12: >2000 pg/mL — ABNORMAL HIGH (ref 200–1100)

## 2016-01-12 LAB — TSH: TSH: 0.89 m[IU]/L (ref 0.40–4.50)

## 2016-01-12 LAB — INSULIN, RANDOM: INSULIN: 9.3 u[IU]/mL (ref 2.0–19.6)

## 2016-01-12 LAB — PSA: PSA: 0.3 ng/mL (ref <=4.00)

## 2016-01-12 LAB — VITAMIN D 25 HYDROXY (VIT D DEFICIENCY, FRACTURES): VIT D 25 HYDROXY: 73 ng/mL (ref 30–100)

## 2016-01-15 ENCOUNTER — Other Ambulatory Visit: Payer: Self-pay | Admitting: Internal Medicine

## 2016-01-15 DIAGNOSIS — M25562 Pain in left knee: Principal | ICD-10-CM

## 2016-01-15 DIAGNOSIS — M25561 Pain in right knee: Secondary | ICD-10-CM

## 2016-01-15 LAB — TB SKIN TEST
INDURATION: 0 mm
TB SKIN TEST: NEGATIVE

## 2016-01-17 ENCOUNTER — Encounter: Payer: Self-pay | Admitting: Internal Medicine

## 2016-01-20 ENCOUNTER — Other Ambulatory Visit: Payer: Self-pay | Admitting: Internal Medicine

## 2016-02-23 ENCOUNTER — Other Ambulatory Visit: Payer: Self-pay | Admitting: Internal Medicine

## 2016-04-17 ENCOUNTER — Ambulatory Visit: Payer: Self-pay | Admitting: Internal Medicine

## 2016-04-23 ENCOUNTER — Ambulatory Visit: Payer: Self-pay | Admitting: Internal Medicine

## 2016-04-26 ENCOUNTER — Other Ambulatory Visit: Payer: Self-pay | Admitting: Internal Medicine

## 2016-05-28 ENCOUNTER — Other Ambulatory Visit: Payer: Self-pay | Admitting: *Deleted

## 2016-05-28 MED ORDER — ATENOLOL 100 MG PO TABS: 100.0000 mg | ORAL_TABLET | Freq: Every day | ORAL | Status: DC

## 2016-05-28 MED ORDER — ATORVASTATIN CALCIUM 80 MG PO TABS: ORAL_TABLET | ORAL | Status: DC

## 2016-06-19 ENCOUNTER — Encounter: Payer: Self-pay | Admitting: Internal Medicine

## 2016-06-19 ENCOUNTER — Ambulatory Visit (INDEPENDENT_AMBULATORY_CARE_PROVIDER_SITE_OTHER): Payer: BLUE CROSS/BLUE SHIELD | Admitting: Internal Medicine

## 2016-06-19 VITALS — BP 126/80 | HR 68 | Temp 98.0°F | Resp 16 | Ht 71.0 in

## 2016-06-19 DIAGNOSIS — M5432 Sciatica, left side: Secondary | ICD-10-CM

## 2016-06-19 MED ORDER — CYCLOBENZAPRINE HCL 10 MG PO TABS: 10.0000 mg | ORAL_TABLET | Freq: Three times a day (TID) | ORAL | 0 refills | Status: DC | PRN

## 2016-06-19 MED ORDER — KETOROLAC TROMETHAMINE 60 MG/2ML IM SOLN
60.0000 mg | Freq: Once | INTRAMUSCULAR | Status: AC
  Administered 2016-06-19: 60 mg via INTRAMUSCULAR

## 2016-06-19 MED ORDER — PREDNISONE 20 MG PO TABS: ORAL_TABLET | ORAL | 0 refills | Status: DC

## 2016-06-19 NOTE — Progress Notes (Signed)
   Subjective:    Patient ID: Steven Bray, male    DOB: May 27, 1951, 65 y.o.   MRN: [id number]  HPI  Patient presents to the office for evaluation of left leg and left sided back pain.  He reports that the pain started without injury.  He reports that the pain is nearly constant and does radiate down his left leg.  He reports that the left leg feels numb.  He denies prior back pain or back injury.  He reports that the numbness goes all over his foot.  He has not been taking any medications at home.  He has not used any ice or heat on it.    Review of Systems  Constitutional: Negative for chills, fatigue and fever.  Gastrointestinal: Negative for abdominal pain, constipation, diarrhea, nausea and vomiting.  Genitourinary: Negative for dysuria, frequency, hematuria and urgency.  Musculoskeletal: Positive for back pain.  Neurological: Positive for numbness.       Objective:   Physical Exam  Constitutional: He is oriented to person, place, and time. He appears well-developed and well-nourished. No distress.  HENT:  Head: Normocephalic.  Mouth/Throat: Oropharynx is clear and moist. No oropharyngeal exudate.  Eyes: Conjunctivae are normal. No scleral icterus.  Neck: Normal range of motion. Neck supple. No JVD present. No thyromegaly present.  Cardiovascular: Normal rate, regular rhythm, normal heart sounds and intact distal pulses.  Exam reveals no gallop and no friction rub.   No murmur heard. Pulmonary/Chest: Effort normal and breath sounds normal. No respiratory distress. He has no wheezes. He has no rales. He exhibits no tenderness.  Abdominal: Soft. Bowel sounds are normal. He exhibits no distension and no mass. There is no tenderness. There is no rebound and no guarding.  Musculoskeletal:  Patient rises slowly from sitting to standing.  They walk without an antalgic gait.  There is no evidence of erythema, ecchymosis, or gross deformity.  There is tenderness to palpation over left  buttock, left groin.  No bony tenderness to the spine.  No hip irritability.  Active ROM is full but painful.  Sensation to light touch is intact over all extremities.  Strength is symmetric and equal in all extremities.    Lymphadenopathy:    He has no cervical adenopathy.  Neurological: He is alert and oriented to person, place, and time. No cranial nerve deficit. Coordination normal.  Skin: Skin is warm and dry. No rash noted. He is not diaphoretic.  Psychiatric: He has a normal mood and affect. His behavior is normal. Judgment and thought content normal.  Nursing note and vitals reviewed.   Vitals:   06/19/16 1053  BP: 126/80  Pulse: 68  Resp: 16  Temp: 98 F (36.7 C)         Assessment & Plan:    1. Sciatica, left -left hip arthritis vs. Sciatica -ortho referral  - predniSONE (DELTASONE) 20 MG tablet; 3 tabs po daily x 3 days, then 2 tabs x 3 days, then 1.5 tabs x 3 days, then 1 tab x 3 days, then 0.5 tabs x 3 days  Dispense: 27 tablet; Refill: 0 - cyclobenzaprine (FLEXERIL) 10 MG tablet; Take 1 tablet (10 mg total) by mouth 3 (three) times daily as needed for muscle spasms.  Dispense: 60 tablet; Refill: 0 - Urinalysis, Routine w reflex microscopic (not at [facility]) - Culture, Urine - ketorolac (TORADOL) injection 60 mg; Inject 2 mLs (60 mg total) into the muscle once.

## 2016-06-19 NOTE — Patient Instructions (Signed)

## 2016-06-20 LAB — URINALYSIS, ROUTINE W REFLEX MICROSCOPIC
Bilirubin Urine: NEGATIVE
Glucose, UA: NEGATIVE
HGB URINE DIPSTICK: NEGATIVE
Ketones, ur: NEGATIVE
LEUKOCYTES UA: NEGATIVE
NITRITE: NEGATIVE
PH: 5.5 (ref 5.0–8.0)
PROTEIN: NEGATIVE
Specific Gravity, Urine: 1.023 (ref 1.001–1.035)

## 2016-06-21 LAB — URINE CULTURE: ORGANISM ID, BACTERIA: NO GROWTH

## 2016-06-22 ENCOUNTER — Other Ambulatory Visit: Payer: Self-pay | Admitting: Internal Medicine

## 2016-06-22 DIAGNOSIS — N320 Bladder-neck obstruction: Secondary | ICD-10-CM

## 2016-07-18 ENCOUNTER — Ambulatory Visit: Payer: Self-pay | Admitting: Internal Medicine

## 2016-10-22 ENCOUNTER — Other Ambulatory Visit: Payer: Self-pay | Admitting: Internal Medicine

## 2016-10-24 ENCOUNTER — Other Ambulatory Visit: Payer: Self-pay | Admitting: *Deleted

## 2016-10-24 MED ORDER — HYDROCHLOROTHIAZIDE 25 MG PO TABS: ORAL_TABLET | ORAL | 0 refills | Status: DC

## 2016-10-24 MED ORDER — MELOXICAM 15 MG PO TABS: ORAL_TABLET | ORAL | 0 refills | Status: DC

## 2016-10-28 ENCOUNTER — Encounter: Payer: Self-pay | Admitting: Internal Medicine

## 2016-10-28 ENCOUNTER — Ambulatory Visit (INDEPENDENT_AMBULATORY_CARE_PROVIDER_SITE_OTHER): Payer: BLUE CROSS/BLUE SHIELD | Admitting: Internal Medicine

## 2016-10-28 VITALS — BP 116/82 | HR 64 | Temp 97.9°F | Resp 16 | Ht 70.0 in | Wt 202.4 lb

## 2016-10-28 DIAGNOSIS — K219 Gastro-esophageal reflux disease without esophagitis: Secondary | ICD-10-CM | POA: Diagnosis not present

## 2016-10-28 DIAGNOSIS — I10 Essential (primary) hypertension: Secondary | ICD-10-CM | POA: Diagnosis not present

## 2016-10-28 DIAGNOSIS — Z79899 Other long term (current) drug therapy: Secondary | ICD-10-CM

## 2016-10-28 DIAGNOSIS — H8110 Benign paroxysmal vertigo, unspecified ear: Secondary | ICD-10-CM

## 2016-10-28 DIAGNOSIS — R7303 Prediabetes: Secondary | ICD-10-CM

## 2016-10-28 DIAGNOSIS — E559 Vitamin D deficiency, unspecified: Secondary | ICD-10-CM | POA: Diagnosis not present

## 2016-10-28 DIAGNOSIS — E782 Mixed hyperlipidemia: Secondary | ICD-10-CM

## 2016-10-28 LAB — CBC WITH DIFFERENTIAL/PLATELET
BASOS PCT: 1 %
Basophils Absolute: 69 cells/uL (ref 0–200)
EOS ABS: 207 {cells}/uL (ref 15–500)
Eosinophils Relative: 3 %
HEMATOCRIT: 46.6 % (ref 38.5–50.0)
Hemoglobin: 15.4 g/dL (ref 13.2–17.1)
Lymphocytes Relative: 18 %
Lymphs Abs: 1242 cells/uL (ref 850–3900)
MCH: 32.8 pg (ref 27.0–33.0)
MCHC: 33 g/dL (ref 32.0–36.0)
MCV: 99.4 fL (ref 80.0–100.0)
MONO ABS: 759 {cells}/uL (ref 200–950)
MONOS PCT: 11 %
MPV: 10.6 fL (ref 7.5–12.5)
NEUTROS ABS: 4623 {cells}/uL (ref 1500–7800)
Neutrophils Relative %: 67 %
PLATELETS: 251 10*3/uL (ref 140–400)
RBC: 4.69 MIL/uL (ref 4.20–5.80)
RDW: 13.1 % (ref 11.0–15.0)
WBC: 6.9 10*3/uL (ref 3.8–10.8)

## 2016-10-28 LAB — TSH: TSH: 0.76 m[IU]/L (ref 0.40–4.50)

## 2016-10-28 MED ORDER — MECLIZINE HCL 25 MG PO TABS: ORAL_TABLET | ORAL | 1 refills | Status: AC

## 2016-10-28 NOTE — Progress Notes (Signed)
[facility] Unk Pinto, M.D.        Uvaldo Bristle. Silverio Lay, P.A.-C       Starlyn Skeans, P.A.-C  [facility]                [address] [place] [id number] Telephone [phone] Telefax [phone] ______________________________________________________________________     This very nice 65 y.o. DWM presents for 3 month follow up with Hypertension, Hyperlipidemia, Pre-Diabetes and Vitamin D Deficiency. He does report an episode of Vertigo about a week ago.      Patient is treated for HTN in 2002 when he presented with a CVA with LUE monoparesis  And a seizure and he recovered w/o sequelae.  BP has been controlled at home. Today's BP is at goal - 116/82. Patient has had no complaints of any cardiac type chest pain, palpitations, dyspnea/orthopnea/PND, dizziness, claudication, or dependent edema.     Hyperlipidemia is controlled with diet & meds. Patient denies myalgias or other med SE's. Last Lipids were at goal on treatment: Lab Results  Component Value Date   CHOL 114 (L) 01/11/2016   HDL 34 (L) 01/11/2016   LDLCALC 50 01/11/2016   TRIG 149 01/11/2016   CHOLHDL 3.4 01/11/2016      Also, the patient has history of PreDiabetes with A1c 5.7% in 2013  and has had no symptoms of reactive hypoglycemia, diabetic polys, paresthesias or visual blurring.  Last A1c was at goal: Lab Results  Component Value Date   HGBA1C 5.6 01/11/2016      Further, the patient also has history of Vitamin D Deficiency in 2008 of "36" and supplements vitamin D without any suspected side-effects. Last vitamin D was at goal:  Lab Results  Component Value Date   VD25OH 73 01/11/2016   Current Outpatient Prescriptions on File Prior to Visit  Medication Sig  . aspirin 81 MG tablet Take 81 mg by mouth daily.  Marland Kitchen atenolol (TENORMIN) 100 MG tablet Take 1 tablet (100 mg total) by mouth daily.  Marland Kitchen atorvastatin (LIPITOR) 80 MG  tablet TAKE 1 TABLET EVERY DAY FOR CHOLESTEROL  . hydrochlorothiazide (HYDRODIURIL) 25 MG tablet TAKE 1/2 TO 1 TABLET EVERY MORNING AS NEEDED FOR BLOOD PRESSURE  . meloxicam (MOBIC) 15 MG tablet TAKE 1/2 - 1 TABLET BY MOUTH DAILY WITH FOOD FOR PAIN AND INFLAMMATION  . tadalafil (CIALIS) 20 MG tablet Take 1 tablet (20 mg total) by mouth daily as needed for erectile dysfunction.  . tamsulosin (FLOMAX) 0.4 MG CAPS capsule TAKE ONE CAPSULE BY MOUTH EVERY DAY  . Vitamin D, Ergocalciferol, (DRISDOL) 50000 units CAPS capsule Take 1 capsule (50,000 Units total) by mouth daily.  . ranitidine (ZANTAC) 150 MG tablet Take 1 tablet (150 mg total) by mouth 2 (two) times daily.   No current facility-administered medications on file prior to visit.    Allergies  Allergen Reactions  . Sulfa Antibiotics Nausea Only   PMHx:   Past Medical History:  Diagnosis Date  . ED (erectile dysfunction)   . GERD (gastroesophageal reflux disease)   . Hyperlipidemia   . Hypertension   . Pre-diabetes   . Stroke ([place])   . TIA (transient ischemic attack)   . Vitamin D deficiency    Immunization History  Administered Date(s) Administered  . PPD Test 12/13/2013, 12/14/2014, 01/11/2016  . Pneumococcal-Unspecified 11/24/2008  . Td 11/11/2004  . Tdap  12/14/2014   Past Surgical History:  Procedure Laterality Date  . HERNIA REPAIR Right    INGUINAL   FHx:    Reviewed / unchanged  SHx:    Reviewed / unchanged  Systems Review:  Constitutional: Denies fever, chills, wt changes, headaches, insomnia, fatigue, night sweats, change in appetite. Eyes: Denies redness, blurred vision, diplopia, discharge, itchy, watery eyes.  ENT: Denies discharge, congestion, post nasal drip, epistaxis, sore throat, earache, hearing loss, dental pain, tinnitus, vertigo, sinus pain, snoring.  CV: Denies chest pain, palpitations, irregular heartbeat, syncope, dyspnea, diaphoresis, orthopnea, PND, claudication or edema. Respiratory: denies  cough, dyspnea, DOE, pleurisy, hoarseness, laryngitis, wheezing.  Gastrointestinal: Denies dysphagia, odynophagia, heartburn, reflux, water brash, abdominal pain or cramps, nausea, vomiting, bloating, diarrhea, constipation, hematemesis, melena, hematochezia  or hemorrhoids. Genitourinary: Denies dysuria, frequency, urgency, nocturia, hesitancy, discharge, hematuria or flank pain. Musculoskeletal: Denies arthralgias, myalgias, stiffness, jt. swelling, pain, limping or strain/sprain.  Skin: Denies pruritus, rash, hives, warts, acne, eczema or change in skin lesion(s). Neuro: No weakness, tremor, incoordination, spasms, paresthesia or pain. Psychiatric: Denies confusion, memory loss or sensory loss. Endo: Denies change in weight, skin or hair change.  Heme/Lymph: No excessive bleeding, bruising or enlarged lymph nodes.  Physical Exam BP 116/82   Pulse 64   Temp 97.9 F (36.6 C)   Resp 16   Ht 5\' 10"  (1.778 m)   Wt 202 lb 6.4 oz (91.8 kg)   BMI 29.04 kg/m   Appears well nourished and in no distress.  Eyes: PERRLA, EOMs, conjunctiva no swelling or erythema. Sinuses: No frontal/maxillary tenderness ENT/Mouth: EAC's clear, TM's nl w/o erythema, bulging. Nares clear w/o erythema, swelling, exudates. Oropharynx clear without erythema or exudates. Oral hygiene is good. Tongue normal, non obstructing. Hearing intact.  Neck: Supple. Thyroid nl. Car 2+/2+ without bruits, nodes or JVD. Chest: Respirations nl with BS clear & equal w/o rales, rhonchi, wheezing or stridor.  Cor: Heart sounds normal w/ regular rate and rhythm without sig. murmurs, gallops, clicks, or rubs. Peripheral pulses normal and equal  without edema.  Abdomen: Soft & bowel sounds normal. Non-tender w/o guarding, rebound, hernias, masses, or organomegaly.  Lymphatics: Unremarkable.  Musculoskeletal: Full ROM all peripheral extremities, joint stability, 5/5 strength, and normal gait.  Skin: Warm, dry without exposed rashes,  lesions or ecchymosis apparent.  Neuro: Cranial nerves intact, reflexes equal bilaterally. Sensory-motor testing grossly intact. Tendon reflexes grossly intact.  Pysch: Alert & oriented x 3.  Insight and judgement nl & appropriate. No ideations.  Assessment and Plan:  1. Essential hypertension  - Continue medication, monitor blood pressure at home.  - Continue DASH diet. Reminder to go to the ER if any CP,  SOB, nausea, dizziness, severe HA, changes vision/speech,  left arm numbness and tingling and jaw pain. - CBC with Differential/Platelet - BASIC METABOLIC PANEL WITH GFR - TSH  2. Mixed hyperlipidemia - Continue diet/meds, exercise,& lifestyle modifications.  - Continue monitor periodic cholesterol/liver & renal functions  - Hepatic function panel - Lipid panel - TSH  3. Pre-diabetes  - Continue diet, exercise, lifestyle modifications.  - Monitor appropriate labs.  - Hemoglobin A1c - Insulin, random  4. Vitamin D deficiency  - Continue supplementation. - VITAMIN D 25 Hydroxy   5. Gastroesophageal reflux disease   6. Medication management  - CBC with Differential/Platelet - BASIC METABOLIC PANEL WITH GFR - Hepatic function panel - Magnesium  7. Benign paroxysmal positional vertigo  - meclizine (ANTIVERT) 25 MG tablet; Take 1 tablet at bedtime to  prevent Vertigo or up to 3 x/ day for Vertigo  Dispense: 90 tablet; Refill: 1       Recommended regular exercise, BP monitoring, weight control, and discussed med and SE's. Recommended labs to assess and monitor clinical status. Further disposition pending results of labs. Over 30 minutes of exam, counseling, chart review was performed

## 2016-10-28 NOTE — Patient Instructions (Signed)

## 2016-10-29 ENCOUNTER — Other Ambulatory Visit: Payer: Self-pay | Admitting: *Deleted

## 2016-10-29 LAB — HEPATIC FUNCTION PANEL
ALBUMIN: 4.1 g/dL (ref 3.6–5.1)
ALK PHOS: 57 U/L (ref 40–115)
ALT: 12 U/L (ref 9–46)
AST: 17 U/L (ref 10–35)
BILIRUBIN INDIRECT: 0.4 mg/dL (ref 0.2–1.2)
BILIRUBIN TOTAL: 0.5 mg/dL (ref 0.2–1.2)
Bilirubin, Direct: 0.1 mg/dL (ref <=0.2)
Total Protein: 6.5 g/dL (ref 6.1–8.1)

## 2016-10-29 LAB — MAGNESIUM: Magnesium: 2 mg/dL (ref 1.5–2.5)

## 2016-10-29 LAB — INSULIN, RANDOM: Insulin: 20.8 u[IU]/mL — ABNORMAL HIGH (ref 2.0–19.6)

## 2016-10-29 LAB — BASIC METABOLIC PANEL WITH GFR
BUN: 20 mg/dL (ref 7–25)
CHLORIDE: 104 mmol/L (ref 98–110)
CO2: 26 mmol/L (ref 20–31)
Calcium: 9.2 mg/dL (ref 8.6–10.3)
Creat: 0.96 mg/dL (ref 0.70–1.25)
GFR, EST NON AFRICAN AMERICAN: 84 mL/min (ref >=60)
GFR, Est African American: >89 mL/min (ref >=60)
Glucose, Bld: 95 mg/dL (ref 65–99)
POTASSIUM: 4.2 mmol/L (ref 3.5–5.3)
Sodium: 140 mmol/L (ref 135–146)

## 2016-10-29 LAB — LIPID PANEL
CHOL/HDL RATIO: 4.2 ratio (ref <5.0)
Cholesterol: 123 mg/dL (ref <200)
HDL: 29 mg/dL — AB (ref >40)
LDL CALC: 24 mg/dL (ref <100)
Triglycerides: 349 mg/dL — ABNORMAL HIGH (ref <150)
VLDL: 70 mg/dL — ABNORMAL HIGH (ref <30)

## 2016-10-29 LAB — HEMOGLOBIN A1C
Hgb A1c MFr Bld: 5 % (ref <5.7)
Mean Plasma Glucose: 97 mg/dL

## 2016-10-29 LAB — VITAMIN D 25 HYDROXY (VIT D DEFICIENCY, FRACTURES): Vit D, 25-Hydroxy: 66 ng/mL (ref 30–100)

## 2016-10-29 MED ORDER — PREDNISONE 20 MG PO TABS: 20.0000 mg | ORAL_TABLET | Freq: Every day | ORAL | 0 refills | Status: DC

## 2016-12-04 ENCOUNTER — Ambulatory Visit (INDEPENDENT_AMBULATORY_CARE_PROVIDER_SITE_OTHER): Payer: BLUE CROSS/BLUE SHIELD | Admitting: Internal Medicine

## 2016-12-04 ENCOUNTER — Encounter: Payer: Self-pay | Admitting: Internal Medicine

## 2016-12-04 VITALS — BP 124/68 | HR 80 | Temp 98.2°F | Resp 16 | Ht 70.0 in

## 2016-12-04 DIAGNOSIS — J069 Acute upper respiratory infection, unspecified: Secondary | ICD-10-CM | POA: Diagnosis not present

## 2016-12-04 MED ORDER — PREDNISONE 20 MG PO TABS: ORAL_TABLET | ORAL | 0 refills | Status: DC

## 2016-12-04 MED ORDER — AZITHROMYCIN 250 MG PO TABS: ORAL_TABLET | ORAL | 0 refills | Status: DC

## 2016-12-04 MED ORDER — FLUTICASONE FUROATE-VILANTEROL 200-25 MCG/INH IN AEPB: 1.0000 | INHALATION_SPRAY | Freq: Every day | RESPIRATORY_TRACT | 0 refills | Status: DC

## 2016-12-04 MED ORDER — PROMETHAZINE-DM 6.25-15 MG/5ML PO SYRP: ORAL_SOLUTION | ORAL | 1 refills | Status: DC

## 2016-12-04 NOTE — Progress Notes (Signed)
HPI  Patient presents to the office for evaluation of sore throat and cough.  It has been going on for 2 days.  Patient reports wet, barky, cough with white sputum production.  They also endorse change in voice, postnasal drip and nasal congestion, headache, fatigue.  .  They have tried none.  They report that nothing has worked.  They denies other sick contacts.  Review of Systems  Constitutional: Positive for malaise/fatigue. Negative for chills and fever.  HENT: Positive for congestion, ear pain, hearing loss and sore throat.   Respiratory: Positive for cough. Negative for sputum production, shortness of breath and wheezing.   Cardiovascular: Negative for chest pain, palpitations and leg swelling.  Neurological: Positive for headaches.    PE:  Vitals:   12/04/16 1545  BP: 124/68  Pulse: 80  Resp: 16  Temp: 98.2 F (36.8 C)    General:  Alert and non-toxic, WDWN, NAD HEENT: NCAT, PERLA, EOM normal, no occular discharge or erythema.  Nasal mucosal edema with sinus tenderness to palpation.  Oropharynx clear with minimal oropharyngeal edema and erythema.  Mucous membranes moist and pink. Neck:  Cervical adenopathy Chest:  RRR no MRGs.  Lungs clear to auscultation A&P with no wheezes rhonchi or rales.   Abdomen: +BS x 4 quadrants, soft, non-tender, no guarding, rigidity, or rebound. Skin: warm and dry no rash Neuro: A&Ox4, CN II-XII grossly intact  Assessment and Plan:   1. Acute URI -prednisone -zpak -breo sample -nasal saline -tylenol or ibuprofen prn

## 2016-12-21 ENCOUNTER — Other Ambulatory Visit: Payer: Self-pay | Admitting: Internal Medicine

## 2016-12-21 DIAGNOSIS — N320 Bladder-neck obstruction: Secondary | ICD-10-CM

## 2017-01-01 ENCOUNTER — Other Ambulatory Visit: Payer: Self-pay | Admitting: Internal Medicine

## 2017-01-01 MED ORDER — AZITHROMYCIN 250 MG PO TABS: ORAL_TABLET | ORAL | 0 refills | Status: DC

## 2017-01-23 ENCOUNTER — Other Ambulatory Visit: Payer: Self-pay | Admitting: Internal Medicine

## 2017-02-24 ENCOUNTER — Encounter: Payer: Self-pay | Admitting: Internal Medicine

## 2017-02-27 ENCOUNTER — Ambulatory Visit (INDEPENDENT_AMBULATORY_CARE_PROVIDER_SITE_OTHER): Payer: BLUE CROSS/BLUE SHIELD | Admitting: Internal Medicine

## 2017-02-27 VITALS — BP 124/78 | HR 64 | Temp 97.2°F | Resp 16 | Ht 70.5 in | Wt 207.8 lb

## 2017-02-27 DIAGNOSIS — E559 Vitamin D deficiency, unspecified: Secondary | ICD-10-CM | POA: Diagnosis not present

## 2017-02-27 DIAGNOSIS — R7303 Prediabetes: Secondary | ICD-10-CM

## 2017-02-27 DIAGNOSIS — Z111 Encounter for screening for respiratory tuberculosis: Secondary | ICD-10-CM

## 2017-02-27 DIAGNOSIS — Z79899 Other long term (current) drug therapy: Secondary | ICD-10-CM | POA: Diagnosis not present

## 2017-02-27 DIAGNOSIS — R5383 Other fatigue: Secondary | ICD-10-CM

## 2017-02-27 DIAGNOSIS — Z0001 Encounter for general adult medical examination with abnormal findings: Secondary | ICD-10-CM

## 2017-02-27 DIAGNOSIS — E782 Mixed hyperlipidemia: Secondary | ICD-10-CM

## 2017-02-27 DIAGNOSIS — Z1212 Encounter for screening for malignant neoplasm of rectum: Secondary | ICD-10-CM

## 2017-02-27 DIAGNOSIS — Z125 Encounter for screening for malignant neoplasm of prostate: Secondary | ICD-10-CM

## 2017-02-27 DIAGNOSIS — Z0000 Encounter for general adult medical examination without abnormal findings: Secondary | ICD-10-CM | POA: Diagnosis not present

## 2017-02-27 DIAGNOSIS — Z136 Encounter for screening for cardiovascular disorders: Secondary | ICD-10-CM

## 2017-02-27 DIAGNOSIS — I10 Essential (primary) hypertension: Secondary | ICD-10-CM | POA: Diagnosis not present

## 2017-02-27 LAB — HEPATIC FUNCTION PANEL
ALBUMIN: 3.8 g/dL (ref 3.6–5.1)
ALK PHOS: 58 U/L (ref 40–115)
ALT: 12 U/L (ref 9–46)
AST: 17 U/L (ref 10–35)
BILIRUBIN TOTAL: 0.5 mg/dL (ref 0.2–1.2)
Bilirubin, Direct: 0.1 mg/dL (ref <=0.2)
Indirect Bilirubin: 0.4 mg/dL (ref 0.2–1.2)
TOTAL PROTEIN: 6.1 g/dL (ref 6.1–8.1)

## 2017-02-27 LAB — IRON AND TIBC
%SAT: 45 % (ref 15–60)
IRON: 123 ug/dL (ref 50–180)
TIBC: 275 ug/dL (ref 250–425)
UIBC: 152 ug/dL (ref 125–400)

## 2017-02-27 LAB — CBC WITH DIFFERENTIAL/PLATELET
BASOS PCT: 1 %
Basophils Absolute: 47 cells/uL (ref 0–200)
EOS ABS: 141 {cells}/uL (ref 15–500)
Eosinophils Relative: 3 %
HEMATOCRIT: 43.4 % (ref 38.5–50.0)
Hemoglobin: 14.5 g/dL (ref 13.2–17.1)
LYMPHS PCT: 33 %
Lymphs Abs: 1551 cells/uL (ref 850–3900)
MCH: 32.7 pg (ref 27.0–33.0)
MCHC: 33.4 g/dL (ref 32.0–36.0)
MCV: 98 fL (ref 80.0–100.0)
MONO ABS: 564 {cells}/uL (ref 200–950)
MONOS PCT: 12 %
MPV: 10 fL (ref 7.5–12.5)
NEUTROS PCT: 51 %
Neutro Abs: 2397 cells/uL (ref 1500–7800)
PLATELETS: 215 10*3/uL (ref 140–400)
RBC: 4.43 MIL/uL (ref 4.20–5.80)
RDW: 13.3 % (ref 11.0–15.0)
WBC: 4.7 10*3/uL (ref 3.8–10.8)

## 2017-02-27 LAB — LIPID PANEL
Cholesterol: 109 mg/dL (ref <200)
HDL: 32 mg/dL — AB (ref >40)
LDL CALC: 58 mg/dL (ref <100)
TRIGLYCERIDES: 97 mg/dL (ref <150)
Total CHOL/HDL Ratio: 3.4 Ratio (ref <5.0)
VLDL: 19 mg/dL (ref <30)

## 2017-02-27 LAB — BASIC METABOLIC PANEL WITH GFR
BUN: 18 mg/dL (ref 7–25)
CO2: 24 mmol/L (ref 20–31)
Calcium: 9.1 mg/dL (ref 8.6–10.3)
Chloride: 108 mmol/L (ref 98–110)
Creat: 1.09 mg/dL (ref 0.70–1.25)
GFR, EST AFRICAN AMERICAN: 83 mL/min (ref >=60)
GFR, Est Non African American: 72 mL/min (ref >=60)
GLUCOSE: 81 mg/dL (ref 65–99)
Potassium: 3.9 mmol/L (ref 3.5–5.3)
Sodium: 143 mmol/L (ref 135–146)

## 2017-02-27 NOTE — Progress Notes (Signed)
[place]   Unk Pinto, M.D.      Uvaldo Bristle. Silverio Lay, P.A.-C [facility]                [address] [place], [place] [address] Telephone [phone] Telefax [phone] Annual  Screening/Preventative Visit  & Comprehensive Evaluation & Examination     This very nice 66 y.o. DWM presents for a Screening/Preventative Visit & comprehensive evaluation and management of multiple medical co-morbidities.  Patient has been followed for HTN, T2_NIDDM  Prediabetes, Hyperlipidemia and Vitamin D Deficiency.     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 . Patient's BP has been controlled at home.  Today's BP: 124/78. Patient denies any cardiac symptoms as chest pain, palpitations, shortness of breath, dizziness or ankle swelling.     Patient's hyperlipidemia is controlled with diet and medications. Patient denies myalgias or other medication SE's. Last lipids were at goal albeit elevated Trig's: Lab Results  Component Value Date   CHOL 123 10/28/2016   HDL 29 (L) 10/28/2016   LDLCALC 24 10/28/2016   TRIG 349 (H) 10/28/2016   CHOLHDL 4.2 10/28/2016      Patient has prediabetes (A1c 5.7% in 2013) and patient denies reactive hypoglycemic symptoms, visual blurring, diabetic polys or paresthesias. Last A1c was at goal: Lab Results  Component Value Date   HGBA1C 5.0 10/28/2016       Finally, patient has history of Vitamin D Deficiency ("36" in 2008)  and last vitamin D was at goal:  Lab Results  Component Value Date   VD25OH 66 10/28/2016   Current Outpatient Prescriptions on File Prior to Visit  Medication Sig  . aspirin 81 MG tablet Take  daily.  Marland Kitchen atorvastatin  80 MG  TAKE 1 TAB EVERY DAY  . hctz 25 MG tablet TAKE 1/2-1 TAB EVERY MORNING   . meclizine  25 MG tablet Take 1 tablet at bedtime up to 3 x/ day for Vertigo   . Meloxicam 15 MG tablet TAKE 1/2-1 TAB DAILY   . tamsulosin 0.4 MG CAPS  TAKE ONE CAP EVERY DAY  . Vitamin D 50,000 units  TAKE 1 CAP DAILY.  . ranitidine 150 MG tablet Take 1 tab 2 x daily.   Allergies  Allergen Reactions  . Sulfa Antibiotics Nausea Only   Past Medical History:  Diagnosis Date  . ED (erectile dysfunction)   . GERD (gastroesophageal reflux disease)   . Hyperlipidemia   . Hypertension   . Pre-diabetes   . Stroke ([place])   . TIA (transient ischemic attack)   . Vitamin D deficiency    Immunization History  Administered Date(s) Administered  . PPD Test 12/13/2013, 12/14/2014, 01/11/2016  . Pneumococcal-Unspecified 11/24/2008  . Td 11/11/2004  . Tdap 12/14/2014   Past Surgical History:  Procedure Laterality Date  . HERNIA REPAIR Right    INGUINAL   Family History  Problem Relation Age of Onset  . Cancer Mother     LYMPHOMA  . Hyperlipidemia Mother   . Hypertension Mother   . Diabetes Father   . Hypertension Father   . Stroke Father   . Kidney disease Father   . Diabetes Sister   . Early death Daughter   . Drug abuse Son     DIED FROM OD  Social History   Social History  . Marital status: Single    Spouse name: N/A  . Number of children: N/A  . Years of education: N/A   Occupational History  . Hotel manager   Social History Main Topics  . Smoking status: Current Every Day Smoker    Packs/day: 0.50    Years: 42.00    Types: Cigarettes  . Smokeless tobacco: Never Used     Comment: trying to quit and smoking 2 to 5 cigarettes a day.  . Alcohol use No     Comment: OCC  . Drug use: No  . Sexual activity: Yes    Birth control/ protection: None    ROS Constitutional: Denies fever, chills, weight loss/gain, headaches, insomnia,  night sweats or change in appetite. Does c/o fatigue. Eyes: Denies redness, blurred vision, diplopia, discharge, itchy or watery eyes.  ENT: Denies discharge, congestion, post nasal drip, epistaxis, sore  throat, earache, hearing loss, dental pain, Tinnitus, Vertigo, Sinus pain or snoring.  Cardio: Denies chest pain, palpitations, irregular heartbeat, syncope, dyspnea, diaphoresis, orthopnea, PND, claudication or edema Respiratory: denies cough, dyspnea, DOE, pleurisy, hoarseness, laryngitis or wheezing.  Gastrointestinal: Denies dysphagia, heartburn, reflux, water brash, pain, cramps, nausea, vomiting, bloating, diarrhea, constipation, hematemesis, melena, hematochezia, jaundice or hemorrhoids Genitourinary: Denies dysuria, frequency, urgency, nocturia, hesitancy, discharge, hematuria or flank pain Musculoskeletal: Denies arthralgia, myalgia, stiffness, Jt. Swelling, pain, limp or strain/sprain. Denies Falls. Skin: Denies puritis, rash, hives, warts, acne, eczema or change in skin lesion Neuro: No weakness, tremor, incoordination, spasms, paresthesia or pain Psychiatric: Denies confusion, memory loss or sensory loss. Denies Depression. Endocrine: Denies change in weight, skin, hair change, nocturia, and paresthesia, diabetic polys, visual blurring or hyper / hypo glycemic episodes.  Heme/Lymph: No excessive bleeding, bruising or enlarged lymph nodes.  Physical Exam  BP 124/78   Pulse 64   Temp 97.2 F (36.2 C)   Resp 16   Ht 5' 10.5" (1.791 m)   Wt 207 lb 12.8 oz (94.3 kg)   BMI 29.39 kg/m   General Appearance: Well nourished and well groomed and in no apparent distress.  Eyes: PERRLA, EOMs, conjunctiva no swelling or erythema, normal fundi and vessels. Sinuses: No frontal/maxillary tenderness ENT/Mouth: EACs patent / TMs  nl. Nares clear without erythema, swelling, mucoid exudates. Oral hygiene is good. No erythema, swelling, or exudate. Tongue normal, non-obstructing. Tonsils not swollen or erythematous. Hearing normal.  Neck: Supple, thyroid normal. No bruits, nodes or JVD. Respiratory: Respiratory effort normal.  BS equal and clear bilateral without rales, rhonci, wheezing or  stridor. Cardio: Heart sounds are normal with regular rate and rhythm and no murmurs, rubs or gallops. Peripheral pulses are normal and equal bilaterally without edema. No aortic or femoral bruits. Chest: symmetric with normal excursions and percussion.  Abdomen: Soft, with Nl bowel sounds. Nontender, no guarding, rebound, hernias, masses, or organomegaly.  Lymphatics: Non tender without lymphadenopathy.  Genitourinary: No hernias.Testes nl. DRE - prostate nl for age - smooth & firm w/o nodules. Musculoskeletal: Full ROM all peripheral extremities, joint stability, 5/5 strength, and normal gait. Skin: Warm and dry without rashes, lesions, cyanosis, clubbing or  ecchymosis.  Neuro: Cranial nerves intact, reflexes equal bilaterally. Normal muscle tone, no cerebellar symptoms. Sensation intact.  Pysch: Alert and oriented X 3 with normal affect, insight and judgment appropriate.   Assessment and Plan  1. Annual Preventative/Screening Exam   2. Essential hypertension  - EKG 12-Lead - [place], RETROPERITNL ABD,  LTD - Urinalysis, Routine w  reflex microscopic - Microalbumin / creatinine urine ratio - CBC with Differential/Platelet - BASIC METABOLIC PANEL WITH GFR - Magnesium - TSH  3. Mixed hyperlipidemia  - EKG 12-Lead - [place], RETROPERITNL ABD,  LTD - Hepatic function panel - Lipid panel - TSH  4. Pre-diabetes  - EKG 12-Lead - [place], RETROPERITNL ABD,  LTD - Hemoglobin A1c - Insulin, random  5. Vitamin D deficiency  - VITAMIN D 25 Hydroxy   6. Screening for rectal cancer  - POC Hemoccult Bld/Stl   7. Prostate cancer screening  - PSA  8. Screening for ischemic heart disease  - EKG 12-Lead - Lipid panel  9. Screening for AAA (aortic abdominal aneurysm)  - [place], RETROPERITNL ABD,  LTD  10. Fatigue, unspecified type  - Vitamin B12 - Iron and TIBC - Testosterone - CBC with Differential/Platelet  11. Medication management  - Urinalysis, Routine w reflex microscopic -  Microalbumin / creatinine urine ratio - CBC with Differential/Platelet - BASIC METABOLIC PANEL WITH GFR - Hepatic function panel - Magnesium - Lipid panel - TSH - Hemoglobin A1c - Insulin, random - VITAMIN D 25 Hydroxy   12. Screening examination for pulmonary tuberculosis  - PPD      Patient was counseled in prudent diet, weight contro to achieve/maintain BMI less than 25, BP monitoring, regular exercise and medications as discussed.  Discussed med effects and SE's. Routine screening labs and tests as requested with regular follow-up as recommended. Over 40 minutes of exam, counseling, chart review and high complex critical decision making was performed

## 2017-02-27 NOTE — Patient Instructions (Signed)

## 2017-02-28 ENCOUNTER — Encounter: Payer: Self-pay | Admitting: Internal Medicine

## 2017-02-28 DIAGNOSIS — Z111 Encounter for screening for respiratory tuberculosis: Secondary | ICD-10-CM | POA: Diagnosis not present

## 2017-02-28 LAB — TSH: TSH: 0.81 mIU/L (ref 0.40–4.50)

## 2017-02-28 LAB — URINALYSIS, MICROSCOPIC ONLY
Bacteria, UA: NONE SEEN [HPF]
Casts: NONE SEEN [LPF]
Crystals: NONE SEEN [HPF]
Squamous Epithelial / LPF: NONE SEEN [HPF] (ref <=5)
WBC UA: NONE SEEN WBC/HPF (ref <=5)
Yeast: NONE SEEN [HPF]

## 2017-02-28 LAB — MICROALBUMIN / CREATININE URINE RATIO
CREATININE, URINE: 239 mg/dL (ref 20–370)
Microalb Creat Ratio: 5 mcg/mg creat (ref <30)
Microalb, Ur: 1.2 mg/dL

## 2017-02-28 LAB — URINALYSIS, ROUTINE W REFLEX MICROSCOPIC
Bilirubin Urine: NEGATIVE
GLUCOSE, UA: NEGATIVE
Ketones, ur: NEGATIVE
Leukocytes, UA: NEGATIVE
Nitrite: NEGATIVE
PH: 5.5 (ref 5.0–8.0)
Protein, ur: NEGATIVE
SPECIFIC GRAVITY, URINE: 1.026 (ref 1.001–1.035)

## 2017-02-28 LAB — TESTOSTERONE: Testosterone: 593 ng/dL (ref 250–827)

## 2017-02-28 LAB — HEMOGLOBIN A1C
Hgb A1c MFr Bld: 5.1 % (ref <5.7)
MEAN PLASMA GLUCOSE: 100 mg/dL

## 2017-02-28 LAB — VITAMIN B12: VITAMIN B 12: 1905 pg/mL — AB (ref 200–1100)

## 2017-02-28 LAB — VITAMIN D 25 HYDROXY (VIT D DEFICIENCY, FRACTURES): VIT D 25 HYDROXY: 81 ng/mL (ref 30–100)

## 2017-02-28 LAB — INSULIN, RANDOM: INSULIN: 11.7 u[IU]/mL (ref 2.0–19.6)

## 2017-02-28 LAB — MAGNESIUM: MAGNESIUM: 1.8 mg/dL (ref 1.5–2.5)

## 2017-02-28 LAB — PSA: PSA: 0.3 ng/mL (ref <=4.0)

## 2017-03-03 LAB — TB SKIN TEST
Induration: 0 mm
TB Skin Test: NEGATIVE

## 2017-04-23 ENCOUNTER — Other Ambulatory Visit: Payer: Self-pay | Admitting: Internal Medicine

## 2017-04-28 ENCOUNTER — Other Ambulatory Visit: Payer: Self-pay | Admitting: *Deleted

## 2017-04-28 MED ORDER — HYDROCHLOROTHIAZIDE 12.5 MG PO CAPS
12.5000 mg | ORAL_CAPSULE | Freq: Every day | ORAL | 1 refills | Status: DC
Start: 2017-04-28 — End: 2017-10-24

## 2017-05-22 ENCOUNTER — Other Ambulatory Visit: Payer: Self-pay | Admitting: *Deleted

## 2017-05-22 DIAGNOSIS — I10 Essential (primary) hypertension: Secondary | ICD-10-CM

## 2017-05-22 MED ORDER — ATENOLOL 50 MG PO TABS: 50.0000 mg | ORAL_TABLET | Freq: Every day | ORAL | 1 refills | Status: DC

## 2017-06-11 NOTE — Progress Notes (Signed)
Assessment and Plan:   Essential hypertension - continue medications, DASH diet, exercise and monitor at home. Call if greater than 130/80.  -     CBC with Differential/Platelet -     BASIC METABOLIC PANEL WITH GFR -     Hepatic function panel -     TSH  Mixed hyperlipidemia -continue medications, check lipids, decrease fatty foods, increase activity.  -     Lipid panel  Pre-diabetes Discussed general issues about diabetes pathophysiology and management., Educational material distributed., Suggested low cholesterol diet., Encouraged aerobic exercise., Discussed foot care., Reminded to get yearly retinal exam.  Medication management -     Magnesium  Tobacco abuse Smoking cessation-  instruction/counseling given, counseled patient on the dangers of tobacco use, advised patient to stop smoking, and reviewed strategies to maximize success  Continue diet and meds as discussed. Further disposition pending results of labs.  HPI 66 y.o. male  presents for 3 month follow up with hypertension, hyperlipidemia, prediabetes and vitamin D. His blood pressure has been controlled at home, today their BP is BP: 120/80 He does not workout, but he is very active at work. He denies chest pain, shortness of breath. He has been having dizziness and fatigue, worse in the evening. Has stress at work. He has a history of focal stroke with a LUE monoparesis that has recovered. Normal B12 last OV.  He is on cholesterol medication and denies myalgias. His cholesterol is at goal. The cholesterol last visit was:   Lab Results  Component Value Date   CHOL 109 02/27/2017   HDL 32 (L) 02/27/2017   LDLCALC 58 02/27/2017   TRIG 97 02/27/2017   CHOLHDL 3.4 02/27/2017   He has been working on diet and exercise for prediabetes, and denies paresthesia of the feet, polydipsia and polyuria. Last A1C in the office was:  Lab Results  Component Value Date   HGBA1C 5.1 02/27/2017   Patient is on Vitamin D supplement.   Lab Results  Component Value Date   VD25OH 81 02/27/2017   BMI is Body mass index is 28.72 kg/m., he is working on diet and exercise. Wt Readings from Last 3 Encounters:  06/12/17 203 lb (92.1 kg)  02/27/17 207 lb 12.8 oz (94.3 kg)  10/28/16 202 lb 6.4 oz (91.8 kg)    Current Medications:  Current Outpatient Prescriptions on File Prior to Visit  Medication Sig Dispense Refill  . aspirin 81 MG tablet Take 81 mg by mouth daily.    Marland Kitchen atenolol (TENORMIN) 50 MG tablet Take 1 tablet (50 mg total) by mouth daily. 180 tablet 1  . atorvastatin (LIPITOR) 80 MG tablet TAKE 1 TABLET EVERY DAY FOR CHOLESTEROL 90 tablet 1  . hydrochlorothiazide (MICROZIDE) 12.5 MG capsule Take 1 capsule (12.5 mg total) by mouth daily. 90 capsule 1  . meclizine (ANTIVERT) 25 MG tablet Take 1 tablet at bedtime to prevent Vertigo or up to 3 x/ day for Vertigo 90 tablet 1  . meloxicam (MOBIC) 15 MG tablet TAKE 1/2 - 1 TABLET BY MOUTH DAILY WITH FOOD FOR PAIN AND INFLAMMATION 90 tablet 1  . tamsulosin (FLOMAX) 0.4 MG CAPS capsule TAKE ONE CAPSULE BY MOUTH EVERY DAY 90 capsule 1  . Vitamin D, Ergocalciferol, (DRISDOL) 50000 units CAPS capsule TAKE 1 CAPSULE (50,000 UNITS TOTAL) BY MOUTH DAILY. 90 capsule 1  . ranitidine (ZANTAC) 150 MG tablet Take 1 tablet (150 mg total) by mouth 2 (two) times daily. 60 tablet 1   No current facility-administered  medications on file prior to visit.    Medical History:  Past Medical History:  Diagnosis Date  . ED (erectile dysfunction)   . GERD (gastroesophageal reflux disease)   . Hyperlipidemia   . Hypertension   . Pre-diabetes   . Stroke ([place])   . TIA (transient ischemic attack)   . Vitamin D deficiency    Allergies:  Allergies  Allergen Reactions  . Sulfa Antibiotics Nausea Only    Review of Systems  Constitutional: Negative.  Negative for chills and fever.  HENT: Negative.   Eyes: Negative.   Respiratory: Negative.   Cardiovascular: Negative.   Gastrointestinal:  Negative.   Genitourinary: Negative for dysuria, flank pain, frequency, hematuria and urgency.  Musculoskeletal: Negative for back pain, falls, joint pain, myalgias and neck pain.  Skin: Negative.   Neurological: Negative.   Psychiatric/Behavioral: Negative.      Family history- Review and unchanged Social history- Review and unchanged Physical Exam: BP 120/80   Pulse 64   Temp (!) 97.3 F (36.3 C)   Resp 14   Ht 5' 10.5" (1.791 m)   Wt 203 lb (92.1 kg)   SpO2 97%   BMI 28.72 kg/m  Wt Readings from Last 3 Encounters:  06/12/17 203 lb (92.1 kg)  02/27/17 207 lb 12.8 oz (94.3 kg)  10/28/16 202 lb 6.4 oz (91.8 kg)   General Appearance: Well nourished, in no apparent distress. Eyes: PERRLA, EOMs, conjunctiva no swelling or erythema Sinuses: No Frontal/maxillary tenderness ENT/Mouth: Ext aud canals clear, TMs without erythema, bulging. No erythema, swelling, or exudate on post pharynx.  Tonsils not swollen or erythematous. Hearing normal.  Neck: Supple, thyroid normal.  Respiratory: Respiratory effort normal, BS equal bilaterally without rales, rhonchi, wheezing or stridor.  Cardio: RRR with no MRGs. Brisk peripheral pulses without edema.  Abdomen: Soft, + BS.  Non tender, no guarding, rebound, hernias, masses Lymphatics: Non tender without lymphadenopathy.  Musculoskeletal: Full ROM, 5/5 strength, normal gait, some cog wheeling/rigidity left arm.    Skin: Warm, dry without rashes, lesions, ecchymosis.  Neuro: Cranial nerves intact. Normal muscle tone, no cerebellar symptoms. Sensation intact.  Psych: Awake and oriented X 3, flat affect, Insight and Judgment appropriate.    Vicie Mutters 4:15 PM

## 2017-06-12 ENCOUNTER — Ambulatory Visit (INDEPENDENT_AMBULATORY_CARE_PROVIDER_SITE_OTHER): Payer: BLUE CROSS/BLUE SHIELD | Admitting: Physician Assistant

## 2017-06-12 ENCOUNTER — Encounter: Payer: Self-pay | Admitting: Physician Assistant

## 2017-06-12 VITALS — BP 120/80 | HR 64 | Temp 97.3°F | Resp 14 | Ht 70.5 in | Wt 203.0 lb

## 2017-06-12 DIAGNOSIS — Z79899 Other long term (current) drug therapy: Secondary | ICD-10-CM | POA: Diagnosis not present

## 2017-06-12 DIAGNOSIS — Z720 Tobacco use: Secondary | ICD-10-CM | POA: Diagnosis not present

## 2017-06-12 DIAGNOSIS — I10 Essential (primary) hypertension: Secondary | ICD-10-CM

## 2017-06-12 DIAGNOSIS — R7303 Prediabetes: Secondary | ICD-10-CM

## 2017-06-12 DIAGNOSIS — E782 Mixed hyperlipidemia: Secondary | ICD-10-CM | POA: Diagnosis not present

## 2017-06-12 LAB — CBC WITH DIFFERENTIAL/PLATELET
BASOS ABS: 42 {cells}/uL (ref 0–200)
BASOS PCT: 1 %
EOS PCT: 4 %
Eosinophils Absolute: 168 cells/uL (ref 15–500)
HCT: 43.7 % (ref 38.5–50.0)
HEMOGLOBIN: 14.6 g/dL (ref 13.2–17.1)
LYMPHS ABS: 1218 {cells}/uL (ref 850–3900)
Lymphocytes Relative: 29 %
MCH: 32.5 pg (ref 27.0–33.0)
MCHC: 33.4 g/dL (ref 32.0–36.0)
MCV: 97.3 fL (ref 80.0–100.0)
MONOS PCT: 14 %
MPV: 10.1 fL (ref 7.5–12.5)
Monocytes Absolute: 588 cells/uL (ref 200–950)
NEUTROS ABS: 2184 {cells}/uL (ref 1500–7800)
Neutrophils Relative %: 52 %
PLATELETS: 228 10*3/uL (ref 140–400)
RBC: 4.49 MIL/uL (ref 4.20–5.80)
RDW: 13.3 % (ref 11.0–15.0)
WBC: 4.2 10*3/uL (ref 3.8–10.8)

## 2017-06-12 NOTE — Patient Instructions (Signed)
Your ears and sinuses are connected by the eustachian tube. When your sinuses are inflamed, this can close off the tube and cause fluid to collect in your middle ear. This can then cause dizziness, popping, clicking, ringing, and echoing in your ears. This is often NOT an infection and does NOT require antibiotics, it is caused by inflammation so the treatments help the inflammation. This can take a long time to get better so please be patient.  Here are things you can do to help with this: - Try the Flonase or Nasonex. Remember to spray each nostril twice towards the outer part of your eye.  Do not sniff but instead pinch your nose and tilt your head back to help the medicine get into your sinuses.  The best time to do this is at bedtime.Stop if you get blurred vision or nose bleeds.  -While drinking fluids, pinch and hold nose close and swallow, to help open eustachian tubes to drain fluid behind ear drums. -Please pick one of the over the counter allergy medications below and take it once daily for allergies.  It will also help with fluid behind ear drums. Claritin or loratadine cheapest but likely the weakest  Zyrtec or certizine at night because it can make you sleepy The strongest is allegra or fexafinadine  Cheapest at [employer], [employer], [employer] -can use decongestant over the counter, please do not use if you have high blood pressure or certain heart conditions.   if worsening HA, changes vision/speech, imbalance, weakness go to the ER    Dizziness Dizziness is a common problem. It is a feeling of unsteadiness or light-headedness. You may feel like you are about to faint. Dizziness can lead to injury if you stumble or fall. Anyone can become dizzy, but dizziness is more common in older adults. This condition can be caused by a number of things, including medicines, dehydration, or illness. Follow these instructions at home: Taking these steps may help with your condition: Eating and  drinking  Drink enough fluid to keep your urine clear or pale yellow. This helps to keep you from becoming dehydrated. Try to drink more clear fluids, such as water.  Do not drink alcohol.  Limit your caffeine intake if directed by your health care provider.  Limit your salt intake if directed by your health care provider. Activity  Avoid making quick movements. ? Rise slowly from chairs and steady yourself until you feel okay. ? In the morning, first sit up on the side of the bed. When you feel okay, stand slowly while you hold onto something until you know that your balance is fine.  Move your legs often if you need to stand in one place for a long time. Tighten and relax your muscles in your legs while you are standing.  Do not drive or operate heavy machinery if you feel dizzy.  Avoid bending down if you feel dizzy. Place items in your home so that they are easy for you to reach without leaning over. Lifestyle  Do not use any tobacco products, including cigarettes, chewing tobacco, or electronic cigarettes. If you need help quitting, ask your health care provider.  Try to reduce your stress level, such as with yoga or meditation. Talk with your health care provider if you need help. General instructions  Watch your dizziness for any changes.  Take medicines only as directed by your health care provider. Talk with your health care provider if you think that your dizziness is caused by   a medicine that you are taking.  Tell a friend or a family member that you are feeling dizzy. If he or she notices any changes in your behavior, have this person call your health care provider.  Keep all follow-up visits as directed by your health care provider. This is important. Contact a health care provider if:  Your dizziness does not go away.  Your dizziness or light-headedness gets worse.  You feel nauseous.  You have reduced hearing.  You have new symptoms.  You are unsteady on  your feet or you feel like the room is spinning. Get help right away if:  You vomit or have diarrhea and are unable to eat or drink anything.  You have problems talking, walking, swallowing, or using your arms, hands, or legs.  You feel generally weak.  You are not thinking clearly or you have trouble forming sentences. It may take a friend or family member to notice this.  You have chest pain, abdominal pain, shortness of breath, or sweating.  Your vision changes.  You notice any bleeding.  You have a headache.  You have neck pain or a stiff neck.  You have a fever. This information is not intended to replace advice given to you by your health care provider. Make sure you discuss any questions you have with your health care provider. Document Released: 04/23/2001 Document Revised: 04/04/2016 Document Reviewed: 10/24/2014 Elsevier Interactive Patient Education  2017 [employer]  

## 2017-06-13 LAB — BASIC METABOLIC PANEL WITH GFR
BUN: 20 mg/dL (ref 7–25)
CHLORIDE: 106 mmol/L (ref 98–110)
CO2: 25 mmol/L (ref 20–31)
Calcium: 9.3 mg/dL (ref 8.6–10.3)
Creat: 1.16 mg/dL (ref 0.70–1.25)
GFR, EST NON AFRICAN AMERICAN: 67 mL/min (ref >=60)
GFR, Est African American: 77 mL/min (ref >=60)
Glucose, Bld: 80 mg/dL (ref 65–99)
POTASSIUM: 4 mmol/L (ref 3.5–5.3)
SODIUM: 141 mmol/L (ref 135–146)

## 2017-06-13 LAB — HEPATIC FUNCTION PANEL
ALT: 11 U/L (ref 9–46)
AST: 16 U/L (ref 10–35)
Albumin: 4 g/dL (ref 3.6–5.1)
Alkaline Phosphatase: 54 U/L (ref 40–115)
BILIRUBIN DIRECT: 0.1 mg/dL (ref <=0.2)
BILIRUBIN INDIRECT: 0.4 mg/dL (ref 0.2–1.2)
BILIRUBIN TOTAL: 0.5 mg/dL (ref 0.2–1.2)
Total Protein: 6.2 g/dL (ref 6.1–8.1)

## 2017-06-13 LAB — LIPID PANEL
CHOL/HDL RATIO: 3 ratio (ref <5.0)
CHOLESTEROL: 109 mg/dL (ref <200)
HDL: 36 mg/dL — AB (ref >40)
LDL Cholesterol: 56 mg/dL (ref <100)
Triglycerides: 87 mg/dL (ref <150)
VLDL: 17 mg/dL (ref <30)

## 2017-06-13 LAB — MAGNESIUM: Magnesium: 1.9 mg/dL (ref 1.5–2.5)

## 2017-06-13 LAB — TSH: TSH: 0.93 m[IU]/L (ref 0.40–4.50)

## 2017-06-13 NOTE — Progress Notes (Signed)
LVM for pt to return office call for LAB results.

## 2017-06-21 ENCOUNTER — Other Ambulatory Visit: Payer: Self-pay | Admitting: Internal Medicine

## 2017-06-21 DIAGNOSIS — N320 Bladder-neck obstruction: Secondary | ICD-10-CM

## 2017-06-25 NOTE — Progress Notes (Signed)
Pt aware of lab results & voiced understanding of those results. Pt wants to know why he is so tired mostly in the afternoon? Please Advise Ok to LVMLangley Gauss) per pt.

## 2017-06-26 NOTE — Progress Notes (Signed)
Per pt ok to LVM: Pt was informed to: Very easily can be dehydration, make sure drinking 100 oz of water a day.  Can check a testosterone next OV, in the mean time add 20-40 of zinc a day Can be stress, try to decrease stress Make sure on B12 Make sure night eating heavy carb meal for lunch/snack because can make him crash.  But can make OV to discuss since he did not mention it at last appointment.  Go to the ER if any CP, SOB, nausea, dizziness, severe HA, changes vision/speech

## 2017-07-27 ENCOUNTER — Other Ambulatory Visit: Payer: Self-pay | Admitting: Internal Medicine

## 2017-09-09 ENCOUNTER — Other Ambulatory Visit: Payer: Self-pay | Admitting: Physician Assistant

## 2017-09-09 DIAGNOSIS — H9203 Otalgia, bilateral: Secondary | ICD-10-CM

## 2017-09-15 ENCOUNTER — Ambulatory Visit: Payer: BLUE CROSS/BLUE SHIELD | Admitting: Internal Medicine

## 2017-10-24 ENCOUNTER — Other Ambulatory Visit: Payer: Self-pay | Admitting: Internal Medicine

## 2017-12-27 ENCOUNTER — Other Ambulatory Visit: Payer: Self-pay | Admitting: Internal Medicine

## 2017-12-27 DIAGNOSIS — N320 Bladder-neck obstruction: Secondary | ICD-10-CM

## 2018-01-21 ENCOUNTER — Other Ambulatory Visit: Payer: Self-pay | Admitting: Internal Medicine

## 2018-01-29 ENCOUNTER — Ambulatory Visit: Payer: BLUE CROSS/BLUE SHIELD | Admitting: Adult Health

## 2018-01-29 ENCOUNTER — Encounter: Payer: Self-pay | Admitting: Adult Health

## 2018-01-29 VITALS — BP 108/66 | HR 60 | Temp 97.5°F | Ht 70.5 in | Wt 202.0 lb

## 2018-01-29 DIAGNOSIS — J209 Acute bronchitis, unspecified: Secondary | ICD-10-CM

## 2018-01-29 MED ORDER — AZITHROMYCIN 250 MG PO TABS: ORAL_TABLET | ORAL | 1 refills | Status: AC

## 2018-01-29 MED ORDER — PROMETHAZINE-DM 6.25-15 MG/5ML PO SYRP: 5.0000 mL | ORAL_SOLUTION | Freq: Four times a day (QID) | ORAL | 1 refills | Status: DC | PRN

## 2018-01-29 MED ORDER — PREDNISONE 20 MG PO TABS: ORAL_TABLET | ORAL | 0 refills | Status: DC

## 2018-01-29 NOTE — Patient Instructions (Signed)

## 2018-01-29 NOTE — Progress Notes (Signed)
Assessment and Plan:  Roey was seen today for uri.  Diagnoses and all orders for this visit:  Acute bronchitis, unspecified organism - Discussed the importance of avoiding unnecessary antibiotic therapy. Suggested symptomatic OTC remedies. Nasal saline spray for congestion. Nasal steroids, allergy pill, oral steroids Follow up as needed. -     predniSONE (DELTASONE) 20 MG tablet; 2 tablets daily for 3 days, 1 tablet daily for 4 days. -     promethazine-dextromethorphan (PROMETHAZINE-DM) 6.25-15 MG/5ML syrup; Take 5 mLs by mouth 4 (four) times daily as needed for cough.  Other orders -     azithromycin (ZITHROMAX) 250 MG tablet; Take 2 tablets (500 mg) on  Day 1,  followed by 1 tablet (250 mg) once daily on Days 2 through 5.  Further disposition pending results of labs. Discussed med's effects and SE's.   Over 15 minutes of exam, counseling, chart review, and critical decision making was performed.   Future Appointments  Date Time Provider [place]  04/02/2018  4:00 PM Unk Pinto, MD [facility] None    ------------------------------------------------------------------------------------------------------------------   HPI BP 108/66   Pulse 60   Temp (!) 97.5 F (36.4 C)   Ht 5' 10.5" (1.791 m)   Wt 202 lb (91.6 kg)   SpO2 93%   BMI 28.57 kg/m   67 y.o.male presents for congestion, runny nose, mild headache, mild cough (productive of scant thick white), ongoing for 7 days, not improving, staying about the same.  Denies fever/chills, hasn't missed any work. Denies dyspnea, chest pain, wheezing, dizziness, palpitations, nausea, vomiting.   He is an active smoker, currently trying to quit.   Past Medical History:  Diagnosis Date  . ED (erectile dysfunction)   . GERD (gastroesophageal reflux disease)   . Hyperlipidemia   . Hypertension   . Pre-diabetes   . Stroke ([place])   . TIA (transient ischemic attack)   . Vitamin D deficiency      Allergies   Allergen Reactions  . Sulfa Antibiotics Nausea Only    Current Outpatient Medications on File Prior to Visit  Medication Sig  . aspirin 81 MG tablet Take 81 mg by mouth daily.  Marland Kitchen atenolol (TENORMIN) 50 MG tablet Take 1 tablet (50 mg total) by mouth daily.  Marland Kitchen atorvastatin (LIPITOR) 80 MG tablet TAKE 1 TABLET EVERY DAY FOR CHOLESTEROL  . hydrochlorothiazide (MICROZIDE) 12.5 MG capsule TAKE ONE CAPSULE BY MOUTH EVERY DAY  . meloxicam (MOBIC) 15 MG tablet TAKE 1/2 - 1 TABLET BY MOUTH DAILY WITH FOOD FOR PAIN AND INFLAMMATION  . ranitidine (ZANTAC) 150 MG tablet Take 1 tablet (150 mg total) by mouth 2 (two) times daily.  . tamsulosin (FLOMAX) 0.4 MG CAPS capsule TAKE 1 CAPSULE BY MOUTH EVERY DAY  . Vitamin D, Ergocalciferol, (DRISDOL) 50000 units CAPS capsule TAKE 1 CAPSULE (50,000 UNITS TOTAL) BY MOUTH DAILY.  . fluticasone (FLONASE) 50 MCG/ACT nasal spray PLACE 2 SPRAYS INTO BOTH NOSTRILS AT BEDTIME. (Patient not taking: Reported on 01/29/2018)   No current facility-administered medications on file prior to visit.     ROS: Review of Systems  Constitutional: Negative for chills, diaphoresis, fever and malaise/fatigue.  HENT: Positive for congestion and sore throat. Negative for ear discharge, ear pain, hearing loss, sinus pain and tinnitus.   Eyes: Negative for blurred vision, pain, discharge and redness.  Respiratory: Positive for cough and sputum production. Negative for hemoptysis, shortness of breath, wheezing and stridor.   Cardiovascular: Negative for chest pain, palpitations and orthopnea.  Gastrointestinal: Negative  for abdominal pain, diarrhea, nausea and vomiting.  Genitourinary: Negative.   Musculoskeletal: Negative for joint pain and myalgias.  Skin: Negative for rash.  Neurological: Positive for headaches. Negative for dizziness, sensory change and weakness.  Endo/Heme/Allergies: Negative for environmental allergies.  Psychiatric/Behavioral: Negative.   All other systems  reviewed and are negative.    Physical Exam:  BP 108/66   Pulse 60   Temp (!) 97.5 F (36.4 C)   Ht 5' 10.5" (1.791 m)   Wt 202 lb (91.6 kg)   SpO2 93%   BMI 28.57 kg/m   General Appearance: Well nourished, in no apparent distress. Eyes: PERRLA, EOMs, conjunctiva no swelling or erythema Sinuses: No Frontal/maxillary tenderness ENT/Mouth: Ext aud canals clear, TMs without erythema, bulging. No erythema, swelling, or exudate on post pharynx.  Tonsils not swollen or erythematous. Hearing normal.  Neck: Supple, thyroid normal.  Respiratory: Respiratory effort normal, BS equal bilaterally with scattered rales and rhonchi, without wheezing or stridor.  Cardio: RRR with no MRGs. Brisk peripheral pulses without edema.  Abdomen: Soft, + BS.  Non tender, no guarding, rebound, hernias, masses. Lymphatics: Non tender without lymphadenopathy.  Musculoskeletal: Full ROM, 5/5 strength, normal gait.  Skin: Warm, dry without rashes, lesions, ecchymosis.  Neuro: Cranial nerves intact. Normal muscle tone, no cerebellar symptoms. Sensation intact.  Psych: Awake and oriented X 3, normal affect, Insight and Judgment appropriate.     Izora Ribas, NP 3:47 PM [facility]

## 2018-02-21 ENCOUNTER — Other Ambulatory Visit: Payer: Self-pay | Admitting: Internal Medicine

## 2018-03-31 ENCOUNTER — Encounter: Payer: Self-pay | Admitting: Internal Medicine

## 2018-04-02 ENCOUNTER — Ambulatory Visit: Payer: BLUE CROSS/BLUE SHIELD | Admitting: Internal Medicine

## 2018-04-02 VITALS — BP 120/76 | HR 60 | Temp 97.3°F | Resp 16 | Ht 70.5 in | Wt 195.8 lb

## 2018-04-02 DIAGNOSIS — E559 Vitamin D deficiency, unspecified: Secondary | ICD-10-CM | POA: Diagnosis not present

## 2018-04-02 DIAGNOSIS — R7303 Prediabetes: Secondary | ICD-10-CM

## 2018-04-02 DIAGNOSIS — Z1329 Encounter for screening for other suspected endocrine disorder: Secondary | ICD-10-CM

## 2018-04-02 DIAGNOSIS — Z1389 Encounter for screening for other disorder: Secondary | ICD-10-CM

## 2018-04-02 DIAGNOSIS — R350 Frequency of micturition: Secondary | ICD-10-CM | POA: Diagnosis not present

## 2018-04-02 DIAGNOSIS — Z79899 Other long term (current) drug therapy: Secondary | ICD-10-CM | POA: Diagnosis not present

## 2018-04-02 DIAGNOSIS — Z136 Encounter for screening for cardiovascular disorders: Secondary | ICD-10-CM

## 2018-04-02 DIAGNOSIS — Z130 Encounter for screening for diseases of the blood and blood-forming organs and certain disorders involving the immune mechanism: Secondary | ICD-10-CM

## 2018-04-02 DIAGNOSIS — Z0001 Encounter for general adult medical examination with abnormal findings: Secondary | ICD-10-CM

## 2018-04-02 DIAGNOSIS — Z125 Encounter for screening for malignant neoplasm of prostate: Secondary | ICD-10-CM | POA: Diagnosis not present

## 2018-04-02 DIAGNOSIS — Z1212 Encounter for screening for malignant neoplasm of rectum: Secondary | ICD-10-CM

## 2018-04-02 DIAGNOSIS — F17200 Nicotine dependence, unspecified, uncomplicated: Secondary | ICD-10-CM

## 2018-04-02 DIAGNOSIS — Z0000 Encounter for general adult medical examination without abnormal findings: Secondary | ICD-10-CM | POA: Diagnosis not present

## 2018-04-02 DIAGNOSIS — I10 Essential (primary) hypertension: Secondary | ICD-10-CM | POA: Diagnosis not present

## 2018-04-02 DIAGNOSIS — N138 Other obstructive and reflux uropathy: Secondary | ICD-10-CM

## 2018-04-02 DIAGNOSIS — Z131 Encounter for screening for diabetes mellitus: Secondary | ICD-10-CM

## 2018-04-02 DIAGNOSIS — Z111 Encounter for screening for respiratory tuberculosis: Secondary | ICD-10-CM

## 2018-04-02 DIAGNOSIS — Z13220 Encounter for screening for lipoid disorders: Secondary | ICD-10-CM | POA: Diagnosis not present

## 2018-04-02 DIAGNOSIS — K219 Gastro-esophageal reflux disease without esophagitis: Secondary | ICD-10-CM

## 2018-04-02 DIAGNOSIS — R5383 Other fatigue: Secondary | ICD-10-CM

## 2018-04-02 DIAGNOSIS — Z1211 Encounter for screening for malignant neoplasm of colon: Secondary | ICD-10-CM

## 2018-04-02 DIAGNOSIS — E782 Mixed hyperlipidemia: Secondary | ICD-10-CM

## 2018-04-02 DIAGNOSIS — N401 Enlarged prostate with lower urinary tract symptoms: Secondary | ICD-10-CM | POA: Diagnosis not present

## 2018-04-02 DIAGNOSIS — Z8249 Family history of ischemic heart disease and other diseases of the circulatory system: Secondary | ICD-10-CM

## 2018-04-02 NOTE — Patient Instructions (Signed)

## 2018-04-02 NOTE — Progress Notes (Signed)
[facility]   Unk Pinto, M.D.     Uvaldo Bristle. Silverio Lay, P.A.-C Liane Comber, [place]                [address] [place] [address] Telephone [phone] Telefax [phone] Annual  Screening/Preventative Visit  & Comprehensive Evaluation & Examination     This very nice 67 y.o. DWM presents for a Screening/Preventative Visit & comprehensive evaluation and management of multiple medical co-morbidities.  Patient has been followed for HTN, HLD, Prediabetes and Vitamin D Deficiency. He also has GERD controlled on hid meds.      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 Patient's BP has been controlled at home.  Today's BP is at goal: 120/76. Patient denies any cardiac symptoms as chest pain, palpitations, shortness of breath, dizziness or ankle swelling.     Patient's hyperlipidemia is controlled with diet and medications. Patient denies myalgias or other medication SE's. Last lipids were at goal: Lab Results  Component Value Date   CHOL 111 04/02/2018   HDL 40 (L) 04/02/2018   LDLCALC 53 04/02/2018   TRIG 97 04/02/2018   CHOLHDL 2.8 04/02/2018      Patient has prediabetes (A1c 5.7%/2013)  and patient denies reactive hypoglycemic symptoms, visual blurring, diabetic polys or paresthesias. Last A1c was Normal & at goal: Lab Results  Component Value Date   HGBA1C 5.3 04/02/2018       Finally, patient has history of Vitamin D Deficiency ("36"/2008) and last vitamin D was at goal: Lab Results  Component Value Date   VD25OH 99 04/02/2018   Current Outpatient Medications on File Prior to Visit  Medication Sig  . aspirin 81 MG tablet Take 81 mg by mouth daily.  Marland Kitchen atenolol (TENORMIN) 50 MG tablet Take 1 tablet (50 mg total) by mouth daily.  Marland Kitchen atorvastatin (LIPITOR) 80 MG tablet TAKE 1 TABLET EVERY DAY FOR CHOLESTEROL  . hydrochlorothiazide  (MICROZIDE) 12.5 MG capsule TAKE ONE CAPSULE BY MOUTH EVERY DAY  . meloxicam (MOBIC) 15 MG tablet TAKE 1/2 - 1 TABLET BY MOUTH DAILY WITH FOOD FOR PAIN AND INFLAMMATION  . tamsulosin (FLOMAX) 0.4 MG CAPS capsule TAKE 1 CAPSULE BY MOUTH EVERY DAY  . Vitamin D, Ergocalciferol, (DRISDOL) 50000 units CAPS capsule TAKE 1 CAPSULE BY MOUTH EVERY DAY (Patient taking differently: takes 1 capsule a week)  . ranitidine (ZANTAC) 150 MG tablet Take 1 tablet (150 mg total) by mouth 2 (two) times daily.   No current facility-administered medications on file prior to visit.    Allergies  Allergen Reactions  . Sulfa Antibiotics Nausea Only   Past Medical History:  Diagnosis Date  . ED (erectile dysfunction)   . GERD (gastroesophageal reflux disease)   . Hyperlipidemia   . Hypertension   . Pre-diabetes   . Stroke ([place])   . TIA (transient ischemic attack)   . Vitamin D deficiency    Health Maintenance  Topic Date Due  . INFLUENZA VACCINE  06/11/2018  . COLONOSCOPY  12/09/2022  . TETANUS/TDAP  12/14/2024  . Hepatitis C Screening  Completed  . HIV Screening  Completed   Immunization History  Administered Date(s) Administered  . PPD Test 12/13/2013, 12/14/2014, 01/11/2016, 02/28/2017  . Pneumococcal-Unspecified 11/24/2008  . Td 11/11/2004  . Tdap 12/14/2014   Last Colon - Patient has  never agreed to have a colonoscopy, but seems to indicate a willingness to have a cologard test.   Past Surgical History:  Procedure Laterality Date  . HERNIA REPAIR Right    INGUINAL   Family History  Problem Relation Age of Onset  . Cancer Mother        LYMPHOMA  . Hyperlipidemia Mother   . Hypertension Mother   . Diabetes Father   . Hypertension Father   . Stroke Father   . Kidney disease Father   . Diabetes Sister   . Early death Daughter   . Drug abuse Son        DIED FROM OD   Social History   Socioeconomic History  . Marital status: Divorced x 2   . Number of children: 1 son deceased age  53 yo from a drug OD  Tobacco Use  . Smoking status: Current Every Day Smoker    Packs/day: 0.50    Years: 42.00    Pack years: 21.00    Types: Cigarettes  . Smokeless tobacco: Never Used  . Tobacco comment: trying to quit and smoking 2 to 5 cigarettes a day.  Substance and Sexual Activity  . Alcohol use: No    Alcohol/week: 0.0 oz    Comment: OCC  . Drug use: No  . Sexual activity: Yes    ROS Constitutional: Denies fever, chills, weight loss/gain, headaches, insomnia,  night sweats or change in appetite. Does c/o fatigue. Eyes: Denies redness, blurred vision, diplopia, discharge, itchy or watery eyes.  ENT: Denies discharge, congestion, post nasal drip, epistaxis, sore throat, earache, hearing loss, dental pain, Tinnitus, Vertigo, Sinus pain or snoring.  Cardio: Denies chest pain, palpitations, irregular heartbeat, syncope, dyspnea, diaphoresis, orthopnea, PND, claudication or edema Respiratory: denies cough, dyspnea, DOE, pleurisy, hoarseness, laryngitis or wheezing.  Gastrointestinal: Denies dysphagia, heartburn, reflux, water brash, pain, cramps, nausea, vomiting, bloating, diarrhea, constipation, hematemesis, melena, hematochezia, jaundice or hemorrhoids Genitourinary: Denies dysuria, frequency, urgency, nocturia, hesitancy, discharge, hematuria or flank pain Musculoskeletal: Denies arthralgia, myalgia, stiffness, Jt. Swelling, pain, limp or strain/sprain. Denies Falls. Skin: Denies puritis, rash, hives, warts, acne, eczema or change in skin lesion Neuro: No weakness, tremor, incoordination, spasms, paresthesia or pain Psychiatric: Denies confusion, memory loss or sensory loss. Denies Depression. Endocrine: Denies change in weight, skin, hair change, nocturia, and paresthesia, diabetic polys, visual blurring or hyper / hypo glycemic episodes.  Heme/Lymph: No excessive bleeding, bruising or enlarged lymph nodes.  Physical Exam  BP 120/76   Pulse 60   Temp (!) 97.3 F (36.3 C)    Resp 16   Ht 5' 10.5" (1.791 m)   Wt 195 lb 12.8 oz (88.8 kg)   BMI 27.70 kg/m   General Appearance: Well nourished and well groomed and in no apparent distress.  Eyes: PERRLA, EOMs, conjunctiva no swelling or erythema, normal fundi and vessels. Sinuses: No frontal/maxillary tenderness ENT/Mouth: EACs patent / TMs  nl. Nares clear without erythema, swelling, mucoid exudates. Oral hygiene is good. No erythema, swelling, or exudate. Tongue normal, non-obstructing. Tonsils not swollen or erythematous. Hearing normal.  Neck: Supple, thyroid not palpable. No bruits, nodes or JVD. Respiratory: Respiratory effort normal.  BS equal and clear bilateral without rales, rhonci, wheezing or stridor. Cardio: Heart sounds are normal with regular rate and rhythm and no murmurs, rubs or gallops. Peripheral pulses are normal and equal bilaterally without edema. No aortic or femoral bruits. Chest: symmetric with normal excursions and percussion.  Abdomen: Soft, with Nl bowel sounds. Nontender,  no guarding, rebound, hernias, masses, or organomegaly.  Lymphatics: Non tender without lymphadenopathy.  Genitourinary: No hernias.Testes nl. DRE - prostate nl for age - smooth & firm w/o nodules. Musculoskeletal: Full ROM all peripheral extremities, joint stability, 5/5 strength, and normal gait. Skin: Warm and dry without rashes, lesions, cyanosis, clubbing or  ecchymosis.  Neuro: Cranial nerves intact, reflexes equal bilaterally. Normal muscle tone, no cerebellar symptoms. Sensation intact.  Pysch: Alert and oriented X 3 with normal affect, insight and judgment appropriate.   Assessment and Plan  1. Annual Preventative/Screening Exam   2. Essential hypertension  - EKG 12-Lead - [place], RETROPERITNL ABD,  LTD - Urinalysis, Routine w reflex microscopic - Microalbumin / creatinine urine ratio - CBC with Differential/Platelet - COMPLETE METABOLIC PANEL WITH GFR - Magnesium - TSH  3. Hyperlipidemia, mixed  -  EKG 12-Lead - [place], RETROPERITNL ABD,  LTD - Lipid panel - TSH  4. Pre-diabetes  - EKG 12-Lead - [place], RETROPERITNL ABD,  LTD - Hemoglobin A1c - Insulin, random  5. Vitamin D deficiency  - VITAMIN D 25 Hydroxyl  6. Gastroesophageal reflux disease  - CBC with Differential/Platelet  7. Screening for colorectal cancer  - POC Hemoccult Bld/Stl   8. Prostate cancer screening  - PSA  9. BPH with obstruction/lower urinary tract symptoms  - PSA  10. Screening for ischemic heart disease  - EKG 12-Lead - Lipid panel  11. FHx: heart disease  - EKG 12-Lead - [place], RETROPERITNL ABD,  LTD  12. Smoker  - EKG 12-Lead - [place], RETROPERITNL ABD,  LTD  13. Screening for AAA (aortic abdominal aneurysm)  - [place], RETROPERITNL ABD,  LTD  14. Screening examination for pulmonary t   15. Fatiguee  - Iron,Total/Total Iron Binding Cap - Vitamin B12 - Testosterone  16. Medication management  - Urinalysis, Routine w reflex microscopic - Microalbumin / creatinine urine ratio - CBC with Differential/Platelet - COMPLETE METABOLIC PANEL WITH GFR - Magnesium - Lipid panel - TSH - Hemoglobin A1c - Insulin, random - VITAMIN D 25 Hydroxyl        Patient was counseled in prudent diet, weight control to achieve/maintain BMI less than 25, BP monitoring, regular exercise and medications as discussed.  Discussed med effects and SE's. Routine screening labs and tests as requested with regular follow-up as recommended. Over 40 minutes of exam, counseling, chart review and high complex critical decision making was performed

## 2018-04-03 LAB — VITAMIN B12: VITAMIN B 12: 1960 pg/mL — AB (ref 200–1100)

## 2018-04-03 LAB — CBC WITH DIFFERENTIAL/PLATELET
BASOS PCT: 0.9 %
Basophils Absolute: 50 cells/uL (ref 0–200)
EOS ABS: 253 {cells}/uL (ref 15–500)
Eosinophils Relative: 4.6 %
HEMATOCRIT: 42 % (ref 38.5–50.0)
Hemoglobin: 14.8 g/dL (ref 13.2–17.1)
LYMPHS ABS: 1243 {cells}/uL (ref 850–3900)
MCH: 33.7 pg — AB (ref 27.0–33.0)
MCHC: 35.2 g/dL (ref 32.0–36.0)
MCV: 95.7 fL (ref 80.0–100.0)
MPV: 10.6 fL (ref 7.5–12.5)
Monocytes Relative: 11.9 %
Neutro Abs: 3300 cells/uL (ref 1500–7800)
Neutrophils Relative %: 60 %
Platelets: 208 10*3/uL (ref 140–400)
RBC: 4.39 10*6/uL (ref 4.20–5.80)
RDW: 12.2 % (ref 11.0–15.0)
Total Lymphocyte: 22.6 %
WBC: 5.5 10*3/uL (ref 3.8–10.8)
WBCMIX: 655 {cells}/uL (ref 200–950)

## 2018-04-03 LAB — URINALYSIS, ROUTINE W REFLEX MICROSCOPIC
Bilirubin Urine: NEGATIVE
Glucose, UA: NEGATIVE
HGB URINE DIPSTICK: NEGATIVE
Ketones, ur: NEGATIVE
LEUKOCYTES UA: NEGATIVE
NITRITE: NEGATIVE
PROTEIN: NEGATIVE
Specific Gravity, Urine: 1.023 (ref 1.001–1.03)
pH: 5.5 (ref 5.0–8.0)

## 2018-04-03 LAB — VITAMIN D 25 HYDROXY (VIT D DEFICIENCY, FRACTURES): Vit D, 25-Hydroxy: 99 ng/mL (ref 30–100)

## 2018-04-03 LAB — COMPLETE METABOLIC PANEL WITH GFR
AG Ratio: 2.1 (calc) (ref 1.0–2.5)
ALBUMIN MSPROF: 4.2 g/dL (ref 3.6–5.1)
ALKALINE PHOSPHATASE (APISO): 59 U/L (ref 40–115)
ALT: 14 U/L (ref 9–46)
AST: 18 U/L (ref 10–35)
BILIRUBIN TOTAL: 0.7 mg/dL (ref 0.2–1.2)
BUN: 25 mg/dL (ref 7–25)
CHLORIDE: 105 mmol/L (ref 98–110)
CO2: 30 mmol/L (ref 20–32)
Calcium: 9.5 mg/dL (ref 8.6–10.3)
Creat: 1.19 mg/dL (ref 0.70–1.25)
GFR, EST AFRICAN AMERICAN: 74 mL/min/{1.73_m2} (ref >=60)
GFR, Est Non African American: 64 mL/min/{1.73_m2} (ref >=60)
GLUCOSE: 83 mg/dL (ref 65–99)
Globulin: 2 g/dL (calc) (ref 1.9–3.7)
Potassium: 4.1 mmol/L (ref 3.5–5.3)
SODIUM: 141 mmol/L (ref 135–146)
TOTAL PROTEIN: 6.2 g/dL (ref 6.1–8.1)

## 2018-04-03 LAB — LIPID PANEL
CHOL/HDL RATIO: 2.8 (calc) (ref <5.0)
CHOLESTEROL: 111 mg/dL (ref <200)
HDL: 40 mg/dL — AB (ref >40)
LDL CHOLESTEROL (CALC): 53 mg/dL
NON-HDL CHOLESTEROL (CALC): 71 mg/dL (ref <130)
Triglycerides: 97 mg/dL (ref <150)

## 2018-04-03 LAB — HEMOGLOBIN A1C
Hgb A1c MFr Bld: 5.3 % of total Hgb (ref <5.7)
Mean Plasma Glucose: 105 (calc)
eAG (mmol/L): 5.8 (calc)

## 2018-04-03 LAB — INSULIN, RANDOM: Insulin: 6.7 u[IU]/mL (ref 2.0–19.6)

## 2018-04-03 LAB — MICROALBUMIN / CREATININE URINE RATIO
CREATININE, URINE: 169 mg/dL (ref 20–320)
MICROALB UR: 0.7 mg/dL
MICROALB/CREAT RATIO: 4 ug/mg{creat} (ref <30)

## 2018-04-03 LAB — MAGNESIUM: MAGNESIUM: 1.9 mg/dL (ref 1.5–2.5)

## 2018-04-03 LAB — IRON, TOTAL/TOTAL IRON BINDING CAP
%SAT: 46 % (ref 15–60)
IRON: 130 ug/dL (ref 50–180)
TIBC: 285 ug/dL (ref 250–425)

## 2018-04-03 LAB — TESTOSTERONE: Testosterone: 463 ng/dL (ref 250–827)

## 2018-04-03 LAB — PSA: PSA: 0.3 ng/mL (ref <=4.0)

## 2018-04-03 LAB — TSH: TSH: 0.73 mIU/L (ref 0.40–4.50)

## 2018-04-05 ENCOUNTER — Encounter: Payer: Self-pay | Admitting: Internal Medicine

## 2018-04-18 ENCOUNTER — Other Ambulatory Visit: Payer: Self-pay | Admitting: Internal Medicine

## 2018-05-01 ENCOUNTER — Other Ambulatory Visit: Payer: Self-pay | Admitting: Adult Health

## 2018-05-11 ENCOUNTER — Other Ambulatory Visit: Payer: Self-pay | Admitting: Internal Medicine

## 2018-05-11 DIAGNOSIS — Z1211 Encounter for screening for malignant neoplasm of colon: Secondary | ICD-10-CM

## 2018-05-11 DIAGNOSIS — Z1212 Encounter for screening for malignant neoplasm of rectum: Principal | ICD-10-CM

## 2018-05-11 LAB — COLOGUARD: COLOGUARD: POSITIVE

## 2018-05-12 ENCOUNTER — Encounter: Payer: Self-pay | Admitting: *Deleted

## 2018-05-19 ENCOUNTER — Telehealth: Payer: Self-pay | Admitting: Internal Medicine

## 2018-05-19 NOTE — Telephone Encounter (Signed)
spoke with patient, gave Cologuard + result. Patient undestands and has spoken to [facility] regarding scheduling. States he will call back to schedule referral appointment, has been extremely busy. Patient requested copy of result. Patient advised may prefer to wait until September 2019,  he becomes Medicare eligable. Advised Dr Melford Aase of patient request. Dr Melford Aase recommends proceeding with colonoscopy due to +Cologuard, left patient voicemail with Dr Idell Pickles recommendation.

## 2018-05-23 ENCOUNTER — Other Ambulatory Visit: Payer: Self-pay | Admitting: Internal Medicine

## 2018-05-23 DIAGNOSIS — I10 Essential (primary) hypertension: Secondary | ICD-10-CM

## 2018-06-25 ENCOUNTER — Other Ambulatory Visit: Payer: Self-pay | Admitting: Internal Medicine

## 2018-06-25 DIAGNOSIS — N320 Bladder-neck obstruction: Secondary | ICD-10-CM

## 2018-07-15 ENCOUNTER — Encounter: Payer: Self-pay | Admitting: Internal Medicine

## 2018-08-03 ENCOUNTER — Ambulatory Visit: Payer: Self-pay | Admitting: Adult Health

## 2018-08-05 ENCOUNTER — Ambulatory Visit: Payer: Self-pay | Admitting: Adult Health

## 2018-08-16 DIAGNOSIS — E663 Overweight: Secondary | ICD-10-CM | POA: Insufficient documentation

## 2018-08-16 DIAGNOSIS — E669 Obesity, unspecified: Secondary | ICD-10-CM | POA: Insufficient documentation

## 2018-08-16 NOTE — Progress Notes (Signed)
Assessment and Plan:   Essential hypertension Continue medication Monitor blood pressure at home; call if consistently over 130/80 Continue DASH diet.   Reminder to go to the ER if any CP, SOB, nausea, dizziness, severe HA, changes vision/speech, left arm numbness and tingling and jaw pain.  Gastroesophageal reflux disease, esophagitis presence not specified Well managed on current medications Discussed diet, avoiding triggers and other lifestyle changes  Vitamin D deficiency At goal at recent check; continue to recommend supplementation for goal of 70-100 Defer vitamin D level  Tobacco abuse Discussed risks associated with tobacco use and advised to reduce or quit Patient is actively cutting back, declines medications to assist Will follow up at the next visit   Other abnormal glucose Recent A1Cs at goal Discussed diet/exercise, weight management  Defer A1C; check CMP  Medication management CBC, CMP/GFR  Mixed hyperlipidemia Continue medications Continue low cholesterol diet and exercise.  Check lipid panel.   Overweight (BMI 25.0-29.9) Long discussion about weight loss, diet, and exercise Recommended diet heavy in fruits and veggies and low in animal meats, cheeses, and dairy products, appropriate calorie intake Discussed appropriate weight for height  Follow up at next visit  BPH with nocturia On flomax but still having 3+ episodes of nocturia at night Discussed and will start finasteride, plan to reevaluate in 12-18 months and d/c flomax if possible  Need for pneumococcal vaccine prevnar 13 administered today   Over 30 minutes of exam, counseling, chart review, and critical decision making was performed  Future Appointments  Date Time Provider [place]  11/02/2018  4:30 PM Unk Pinto, MD [facility] None  05/11/2019  3:00 PM Unk Pinto, MD [facility] None     -------------------------------------------------------------------------------------------------------------------------------------  Steven Bray is a 67 y.o. male who presents for  3 month follow up for HTN, hyperlipidemia, glucose management, and vitamin D Def.   he currently continues to smoke 0.5-1 pack a day, actively trying to cut back; discussed risks associated with smoking, patient is ready to quit. Declines medications.   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  Wt Readings from Last 3 Encounters:  08/17/18 197 lb (89.4 kg)  04/02/18 195 lb 12.8 oz (88.8 kg)  01/29/18 202 lb (91.6 kg)   His blood pressure has been controlled at home, today their BP is BP: 110/78 He does not workout. He denies chest pain, shortness of breath, dizziness.   He is on cholesterol medication (atorvastatin 40 mg MWF) and denies myalgias. His cholesterol is at goal. The cholesterol last visit was:   Lab Results  Component Value Date   CHOL 111 04/02/2018   HDL 40 (L) 04/02/2018   LDLCALC 53 04/02/2018   TRIG 97 04/02/2018   CHOLHDL 2.8 04/02/2018   He has not been working on diet and exercise for glucose management, and denies foot ulcerations, increased appetite, nausea, paresthesia of the feet, polydipsia, polyuria, visual disturbances, vomiting and weight loss. Last A1C in the office was:  Lab Results  Component Value Date   HGBA1C 5.3 04/02/2018   Last GFR Lab Results  Component Value Date   GFRNONAA 64 04/02/2018   Patient is on Vitamin D supplement.   Lab Results  Component Value Date   VD25OH 99 04/02/2018       Medication Review:   Current Outpatient Medications (Cardiovascular):  .  atenolol (TENORMIN) 50 MG tablet, TAKE 1 TABLET BY MOUTH  EVERY DAY .  atorvastatin (LIPITOR) 80 MG tablet, TAKE 1 TABLET EVERY DAY FOR CHOLESTEROL .  hydrochlorothiazide (MICROZIDE) 12.5 MG capsule, TAKE 1  CAPSULE BY MOUTH EVERY DAY   Current Outpatient Medications (Analgesics):  .  aspirin 81 MG tablet, Take 81 mg by mouth daily. .  meloxicam (MOBIC) 15 MG tablet, TAKE 1/2 - 1 TABLET BY MOUTH DAILY WITH FOOD FOR PAIN AND INFLAMMATION   Current Outpatient Medications (Other):  .  ranitidine (ZANTAC) 150 MG tablet, Take 1 tablet (150 mg total) by mouth 2 (two) times daily. .  tamsulosin (FLOMAX) 0.4 MG CAPS capsule, TAKE 1 CAPSULE BY MOUTH EVERY DAY .  Vitamin D, Ergocalciferol, (DRISDOL) 50000 units CAPS capsule, TAKE 1 CAPSULE BY MOUTH EVERY DAY (Patient taking differently: takes 1 capsule a week)  Allergies: Allergies  Allergen Reactions  . Sulfa Antibiotics Nausea Only    Current Problems (verified) has Hypertension; Hyperlipidemia; Other abnormal glucose; GERD (gastroesophageal reflux disease); Vitamin D deficiency; Medication management; Tobacco abuse; and Overweight (BMI 25.0-29.9) on their problem list.   Review of Systems  Constitutional: Negative for malaise/fatigue and weight loss.  HENT: Negative for hearing loss and tinnitus.   Eyes: Negative for blurred vision and double vision.  Respiratory: Negative for cough, shortness of breath and wheezing.   Cardiovascular: Negative for chest pain, palpitations, orthopnea, claudication and leg swelling.  Gastrointestinal: Negative for abdominal pain, blood in stool, constipation, diarrhea, heartburn, melena, nausea and vomiting.  Genitourinary:       Nocturia  Musculoskeletal: Negative for joint pain and myalgias.  Skin: Negative for rash.  Neurological: Negative for dizziness, tingling, sensory change, weakness and headaches.  Endo/Heme/Allergies: Negative for polydipsia.  Psychiatric/Behavioral: Negative.   All other systems reviewed and are negative.    Today's Vitals   08/17/18 1552  BP: 110/78  Pulse: (!) 57  Temp: (!) 97.3 F (36.3 C)  SpO2: 95%  Weight: 197 lb (89.4 kg)  Height: 5' 10.5" (1.791 m)   Body mass  index is 27.87 kg/m.  General appearance: alert, no distress, WD/WN, male HEENT: normocephalic, sclerae anicteric, TMs pearly, nares patent, no discharge or erythema, pharynx normal Oral cavity: MMM, no lesions Neck: supple, no lymphadenopathy, no thyromegaly, no masses Heart: RRR, normal S1, S2, no murmurs Lungs: CTA bilaterally, no wheezes, rhonchi, or rales Abdomen: +bs, soft, non tender, non distended, no masses, no hepatomegaly, no splenomegaly Musculoskeletal: nontender, no swelling, no obvious deformity Extremities: no edema, no cyanosis, no clubbing Pulses: 2+ symmetric, upper and lower extremities, normal cap refill Neurological: alert, oriented x 3, CN2-12 intact, strength normal upper extremities and lower extremities, sensation normal throughout, DTRs 2+ throughout, no cerebellar signs, gait normal Psychiatric: normal affect, behavior normal, pleasant      Izora Ribas, NP   08/17/2018

## 2018-08-17 ENCOUNTER — Encounter: Payer: Self-pay | Admitting: Adult Health

## 2018-08-17 ENCOUNTER — Ambulatory Visit (INDEPENDENT_AMBULATORY_CARE_PROVIDER_SITE_OTHER): Payer: BLUE CROSS/BLUE SHIELD | Admitting: Adult Health

## 2018-08-17 VITALS — BP 110/78 | HR 57 | Temp 97.3°F | Ht 70.5 in | Wt 197.0 lb

## 2018-08-17 DIAGNOSIS — Z23 Encounter for immunization: Secondary | ICD-10-CM | POA: Diagnosis not present

## 2018-08-17 DIAGNOSIS — N401 Enlarged prostate with lower urinary tract symptoms: Secondary | ICD-10-CM | POA: Insufficient documentation

## 2018-08-17 DIAGNOSIS — R351 Nocturia: Secondary | ICD-10-CM

## 2018-08-17 DIAGNOSIS — E782 Mixed hyperlipidemia: Secondary | ICD-10-CM | POA: Diagnosis not present

## 2018-08-17 DIAGNOSIS — E559 Vitamin D deficiency, unspecified: Secondary | ICD-10-CM

## 2018-08-17 DIAGNOSIS — Z720 Tobacco use: Secondary | ICD-10-CM | POA: Diagnosis not present

## 2018-08-17 DIAGNOSIS — R7309 Other abnormal glucose: Secondary | ICD-10-CM

## 2018-08-17 DIAGNOSIS — K219 Gastro-esophageal reflux disease without esophagitis: Secondary | ICD-10-CM

## 2018-08-17 DIAGNOSIS — I10 Essential (primary) hypertension: Secondary | ICD-10-CM | POA: Diagnosis not present

## 2018-08-17 DIAGNOSIS — E663 Overweight: Secondary | ICD-10-CM

## 2018-08-17 DIAGNOSIS — Z79899 Other long term (current) drug therapy: Secondary | ICD-10-CM

## 2018-08-17 MED ORDER — PREDNISONE 20 MG PO TABS: ORAL_TABLET | ORAL | 0 refills | Status: DC

## 2018-08-17 MED ORDER — ATORVASTATIN CALCIUM 80 MG PO TABS: ORAL_TABLET | ORAL | 1 refills | Status: DC

## 2018-08-17 MED ORDER — FINASTERIDE 5 MG PO TABS: 5.0000 mg | ORAL_TABLET | Freq: Every day | ORAL | 1 refills | Status: DC

## 2018-08-17 NOTE — Patient Instructions (Addendum)
Please call [facility] to ask about colonoscopy - it is very important to follow up with this due to your recent positive cologuard report (this test showed [place] there was some concerning DNA in your stool).   We are starting finasteride for your prostate symptoms; this medication should be taken daily, and over the course of 1-2 years will gradually shrink your prostate so it is not compressing on your urethra. We may be able to stop flomax in 1-2 years if you are doing much better.    Know what a healthy weight is for you (roughly BMI <25) and aim to maintain this  Aim for 7+ servings of fruits and vegetables daily  65-80+ fluid ounces of water or unsweet tea for healthy kidneys  Limit to max 1 drink of alcohol per day; avoid smoking/tobacco  Limit animal fats in diet for cholesterol and heart health - choose grass fed whenever available  Avoid highly processed foods, and foods high in saturated/trans fats  Aim for low stress - take time to unwind and care for your mental health  Aim for 150 min of moderate intensity exercise weekly for heart health, and weights twice weekly for bone health  Aim for 7-9 hours of sleep daily    Finasteride (Proscar) tablets What is this medicine? FINASTERIDE (fi NAS teer ide) is used to treat benign prostatic hyperplasia (BPH) in men. This is a condition that causes you to have an enlarged prostate. This medicine helps to control your symptoms, decrease urinary retention, and reduces your risk of needing surgery. When used in combination with certain other medicines, this drug can slow down the progression of your disease. This medicine may be used for other purposes; ask your health care provider or pharmacist if you have questions. COMMON BRAND NAME(S): Proscar What should I tell my health care provider before I take this medicine? They need to know if you have any of these conditions: -liver disease -an unusual or allergic reaction to finasteride,  other medicines, foods, dyes, or preservatives -pregnant or trying to get pregnant -breast-feeding How should I use this medicine? Take this medicine by mouth with a glass of water. Follow the directions on the prescription label. You can take this medicine with or without food. Take your doses at regular intervals. Do not take your medicine more often than directed. Do not stop taking except on the advice of your doctor or health care professional. Talk to your pediatrician regarding the use of this medicine in children. Special care may be needed. Overdosage: If you think you have taken too much of this medicine contact a poison control center or emergency room at once. NOTE: This medicine is only for you. Do not share this medicine with others. What if I miss a dose? If you miss a dose, take it as soon as you can. If it is almost time for your next dose, take only that dose. Do not take double or extra doses. What may interact with this medicine? -saw palmetto or other dietary supplements This list may not describe all possible interactions. Give your health care provider a list of all the medicines, herbs, non-prescription drugs, or dietary supplements you use. Also tell them if you smoke, drink alcohol, or use illegal drugs. Some items may interact with your medicine. What should I watch for while using this medicine? Do not donate blood while you are taking this medicine. This will prevent giving this medicine to a pregnant male through a blood transfusion. Ask  your doctor or health care professional when it is safe to donate blood after you stop taking this medicine. Women who are pregnant or may get pregnant must not handle broken or crushed finasteride tablets. The active ingredient could harm the unborn baby. If a pregnant woman comes into contact with broken or crushed tablets she should check with her doctor or health care professional. Exposure to whole tablets is not expected to cause  harm as long as they are not swallowed. Contact your doctor or health care professional if your symptoms do not start to get better. You may need to take this medicine for 6 to 12 months to get the best results. This medicine can interfere with PSA laboratory tests for prostate cancer. If you are scheduled to have a lab test for prostate cancer, tell your doctor or health care professional that you are taking this medicine. This medicine may increase your risk of getting some cancers, like breast cancer. Talk with your doctor. What side effects may I notice from receiving this medicine? Side effects that you should report to your doctor or health care professional as soon as possible: -any signs of an allergic reaction like rash, itching, hives or swelling of the lips or face -changes in breast like lumps, pain or fluids leaking from the nipple -pain in the testicles Side effects that usually do not require medical attention (report to your doctor or health care professional if they continue or are bothersome): -sexual difficulties like decreased sexual desire or ability to get an erection -small amount of semen released during sex This list may not describe all possible side effects. Call your doctor for medical advice about side effects. You may report side effects to FDA at 1-800-FDA-1088. Where should I keep my medicine? Keep out of the reach of children. Store at room temperature below 30 degrees C (86 degrees F). Protect from light. Keep container tightly closed. Throw away any unused medicine after the expiration date. NOTE: This sheet is a summary. It may not cover all possible information. If you have questions about this medicine, talk to your doctor, pharmacist, or health care provider.  2018 Elsevier/Gold Standard (2015-06-15 17:24:30)

## 2018-08-18 ENCOUNTER — Other Ambulatory Visit: Payer: Self-pay | Admitting: Adult Health

## 2018-08-18 DIAGNOSIS — R799 Abnormal finding of blood chemistry, unspecified: Secondary | ICD-10-CM

## 2018-08-18 DIAGNOSIS — I10 Essential (primary) hypertension: Secondary | ICD-10-CM

## 2018-08-18 LAB — CBC WITH DIFFERENTIAL/PLATELET
Basophils Absolute: 39 cells/uL (ref 0–200)
Basophils Relative: 0.7 %
EOS ABS: 187 {cells}/uL (ref 15–500)
Eosinophils Relative: 3.4 %
HCT: 45.1 % (ref 38.5–50.0)
Hemoglobin: 15.2 g/dL (ref 13.2–17.1)
Lymphs Abs: 1414 cells/uL (ref 850–3900)
MCH: 32.2 pg (ref 27.0–33.0)
MCHC: 33.7 g/dL (ref 32.0–36.0)
MCV: 95.6 fL (ref 80.0–100.0)
MPV: 10.6 fL (ref 7.5–12.5)
Monocytes Relative: 12.8 %
NEUTROS PCT: 57.4 %
Neutro Abs: 3157 cells/uL (ref 1500–7800)
PLATELETS: 228 10*3/uL (ref 140–400)
RBC: 4.72 10*6/uL (ref 4.20–5.80)
RDW: 12.1 % (ref 11.0–15.0)
TOTAL LYMPHOCYTE: 25.7 %
WBC mixed population: 704 cells/uL (ref 200–950)
WBC: 5.5 10*3/uL (ref 3.8–10.8)

## 2018-08-18 LAB — COMPLETE METABOLIC PANEL WITH GFR
AG Ratio: 1.7 (calc) (ref 1.0–2.5)
ALBUMIN MSPROF: 4.2 g/dL (ref 3.6–5.1)
ALT: 12 U/L (ref 9–46)
AST: 14 U/L (ref 10–35)
Alkaline phosphatase (APISO): 59 U/L (ref 40–115)
BUN/Creatinine Ratio: 27 (calc) — ABNORMAL HIGH (ref 6–22)
BUN: 31 mg/dL — ABNORMAL HIGH (ref 7–25)
CALCIUM: 9.4 mg/dL (ref 8.6–10.3)
CO2: 31 mmol/L (ref 20–32)
CREATININE: 1.14 mg/dL (ref 0.70–1.25)
Chloride: 104 mmol/L (ref 98–110)
GFR, EST NON AFRICAN AMERICAN: 67 mL/min/{1.73_m2} (ref >=60)
GFR, Est African American: 78 mL/min/{1.73_m2} (ref >=60)
Globulin: 2.5 g/dL (calc) (ref 1.9–3.7)
Glucose, Bld: 82 mg/dL (ref 65–99)
Potassium: 4.2 mmol/L (ref 3.5–5.3)
Sodium: 140 mmol/L (ref 135–146)
Total Bilirubin: 0.5 mg/dL (ref 0.2–1.2)
Total Protein: 6.7 g/dL (ref 6.1–8.1)

## 2018-08-18 LAB — LIPID PANEL
CHOL/HDL RATIO: 4.5 (calc) (ref <5.0)
CHOLESTEROL: 168 mg/dL (ref <200)
HDL: 37 mg/dL — AB (ref >40)
LDL CHOLESTEROL (CALC): 102 mg/dL — AB
Non-HDL Cholesterol (Calc): 131 mg/dL (calc) — ABNORMAL HIGH (ref <130)
TRIGLYCERIDES: 177 mg/dL — AB (ref <150)

## 2018-08-18 LAB — TSH: TSH: 1.09 mIU/L (ref 0.40–4.50)

## 2018-08-18 MED ORDER — ATENOLOL 50 MG PO TABS: ORAL_TABLET | ORAL | 3 refills | Status: DC

## 2018-10-22 ENCOUNTER — Other Ambulatory Visit: Payer: Self-pay | Admitting: Internal Medicine

## 2018-10-24 ENCOUNTER — Other Ambulatory Visit: Payer: Self-pay | Admitting: Internal Medicine

## 2018-11-01 ENCOUNTER — Encounter: Payer: Self-pay | Admitting: Internal Medicine

## 2018-11-01 NOTE — Progress Notes (Signed)
RESCHEDULED

## 2018-11-02 ENCOUNTER — Ambulatory Visit: Payer: Self-pay | Admitting: Internal Medicine

## 2018-12-24 ENCOUNTER — Other Ambulatory Visit: Payer: Self-pay | Admitting: Internal Medicine

## 2018-12-24 DIAGNOSIS — N320 Bladder-neck obstruction: Secondary | ICD-10-CM

## 2019-01-15 ENCOUNTER — Ambulatory Visit (INDEPENDENT_AMBULATORY_CARE_PROVIDER_SITE_OTHER): Payer: Medicare Other | Admitting: Physician Assistant

## 2019-01-15 ENCOUNTER — Encounter: Payer: Self-pay | Admitting: Physician Assistant

## 2019-01-15 VITALS — BP 120/82 | HR 57 | Temp 97.4°F | Ht 70.5 in | Wt 204.8 lb

## 2019-01-15 DIAGNOSIS — I10 Essential (primary) hypertension: Secondary | ICD-10-CM

## 2019-01-15 DIAGNOSIS — Z0000 Encounter for general adult medical examination without abnormal findings: Secondary | ICD-10-CM

## 2019-01-15 DIAGNOSIS — R7309 Other abnormal glucose: Secondary | ICD-10-CM | POA: Diagnosis not present

## 2019-01-15 DIAGNOSIS — Z79899 Other long term (current) drug therapy: Secondary | ICD-10-CM

## 2019-01-15 DIAGNOSIS — E782 Mixed hyperlipidemia: Secondary | ICD-10-CM | POA: Diagnosis not present

## 2019-01-15 DIAGNOSIS — Z7189 Other specified counseling: Secondary | ICD-10-CM | POA: Diagnosis not present

## 2019-01-15 DIAGNOSIS — K219 Gastro-esophageal reflux disease without esophagitis: Secondary | ICD-10-CM

## 2019-01-15 DIAGNOSIS — R195 Other fecal abnormalities: Secondary | ICD-10-CM

## 2019-01-15 DIAGNOSIS — R351 Nocturia: Secondary | ICD-10-CM

## 2019-01-15 DIAGNOSIS — R6889 Other general symptoms and signs: Secondary | ICD-10-CM | POA: Diagnosis not present

## 2019-01-15 DIAGNOSIS — Z720 Tobacco use: Secondary | ICD-10-CM

## 2019-01-15 DIAGNOSIS — N401 Enlarged prostate with lower urinary tract symptoms: Secondary | ICD-10-CM

## 2019-01-15 DIAGNOSIS — E663 Overweight: Secondary | ICD-10-CM

## 2019-01-15 DIAGNOSIS — R059 Cough, unspecified: Secondary | ICD-10-CM

## 2019-01-15 DIAGNOSIS — E559 Vitamin D deficiency, unspecified: Secondary | ICD-10-CM

## 2019-01-15 DIAGNOSIS — Z0001 Encounter for general adult medical examination with abnormal findings: Secondary | ICD-10-CM | POA: Diagnosis not present

## 2019-01-15 DIAGNOSIS — R05 Cough: Secondary | ICD-10-CM

## 2019-01-15 MED ORDER — PREDNISONE 20 MG PO TABS: ORAL_TABLET | ORAL | 0 refills | Status: DC

## 2019-01-15 MED ORDER — PANTOPRAZOLE SODIUM 40 MG PO TBEC: 40.0000 mg | DELAYED_RELEASE_TABLET | Freq: Every day | ORAL | 3 refills | Status: DC

## 2019-01-15 NOTE — Patient Instructions (Addendum)
Start the prontonix for at least 3-5 days   Then if your congestion is not better, get the prednisone  Silent reflux: Not all heartburn burns...Marland KitchenMarland KitchenMarland Kitchen  What is LPR? Laryngopharyngeal reflux (LPR) or silent reflux is a condition in which acid that is made in the stomach travels up the esophagus (swallowing tube) and gets to the throat. Not everyone with reflux has a lot of heartburn or indigestion. In fact, many people with LPR never have heartburn. This is why LPR is called SILENT REFLUX, and the terms "Silent reflux" and "LPR" are often used interchangeably. Because LPR is silent, it is sometimes difficult to diagnose.  How can you tell if you have LPR?  Marland Kitchen Chronic hoarseness- Some people have hoarseness that comes and goes . throat clearing  . Cough . It can cause shortness of breath and cause asthma like symptoms. Marland Kitchen a feeling of a lump in the throat  . difficulty swallowing . a problem with too much nose and throat drainage.  . Some people will feel their esophagus spasm which feels like their heart beating hard and fast, this will usually be after a meal, at rest, or lying down at night.    How do I treat this? Treatment for LPR should be individualized, and your doctor will suggest the best treatment for you. Generally there are several treatments for LPR: . changing habits and diet to reduce reflux,  . medications to reduce stomach acid, and  . surgery to prevent reflux. Most people with LPR need to modify how and when they eat, as well as take some medication, to get well. Sometimes, nonprescription liquid antacids, such as Maalox, Gelucil and Mylanta are recommended. When used, these antacids should be taken four times each day - one tablespoon one hour after each meal and before bedtime. Dietary and lifestyle changes alone are not often enough to control LPR - medications that reduce stomach acid are also usually needed. These must be prescribed by our doctor.   TIPS FOR REDUCING  REFLUX AND LPR Control your LIFE-STYLE and your DIET! Marland Kitchen If you use tobacco, QUIT.  Marland Kitchen Smoking makes you reflux. After every cigarette you have some LPR.  . Don't wear clothing that is too tight, especially around the waist (trousers, corsets, belts).  . Do not lie down just after eating...in fact, do not eat within three hours of bedtime.  . You should be on a low-fat diet.  . Limit your intake of red meat.  . Limit your intake of butter.  Marland Kitchen Avoid fried foods.  . Avoid chocolate  . Avoid cheese.  Marland Kitchen Avoid eggs. Marland Kitchen Specifically avoid caffeine (especially coffee and tea), soda pop (especially cola) and mints.  . Avoid alcoholic beverages, particularly in the evening.   Going to refer for colonoscopy AFTER I DEAL WITH YOUR COLOGUARD COST  Colon cancer is 3rd most diagnosed cancer and 2nd leading cause of death in both men and women 76 years of age and older despite being one of the most preventable and treatable cancers if found early.  4 of out 5 people diagnosed with colon cancer have NO prior family history.  When caught EARLY 90% of colon cancer is curable.  INFORMATION ABOUT YOUR XRAY  Can walk into [address] building for an Insurance account manager. They will have the order and take you back. You do not any paper work, I should get the result back today or tomorrow. This order is good for a year.    SMOKING  CESSATION  [employer] cancer society  (270)748-6576 for more information or for a free program for smoking cessation help.   You can call QUIT SMART 1-800-QUIT-NOW for free nicotine patches or replacement therapy- if they are out- keep calling  [facility] Can call for smoking cessation classes, [phone]  If you have a smart phone, please look up Smoke Free app, this will help you stay on track and give you information about money you have saved, life that you have gained back and a ton of more information.     ADVANTAGES OF QUITTING SMOKING  Within 20 minutes, blood  pressure decreases. Your pulse is at normal level.  After 8 hours, carbon monoxide levels in the blood return to normal. Your oxygen level increases.  After 24 hours, the chance of having a heart attack starts to decrease. Your breath, hair, and body stop smelling like smoke.  After 48 hours, damaged nerve endings begin to recover. Your sense of taste and smell improve.  After 72 hours, the body is virtually free of nicotine. Your bronchial tubes relax and breathing becomes easier.  After 2 to 12 weeks, lungs can hold more air. Exercise becomes easier and circulation improves.  After 1 year, the risk of coronary heart disease is cut in half.  After 5 years, the risk of stroke falls to the same as a nonsmoker.  After 10 years, the risk of lung cancer is cut in half and the risk of other cancers decreases significantly.  After 15 years, the risk of coronary heart disease drops, usually to the level of a nonsmoker.  You will have extra money to spend on things other than cigarettes.

## 2019-01-15 NOTE — Progress Notes (Signed)
WELCOME TO MEDICARE ANNUAL WELLNESS VISIT AND FOLLOW UP Assessment:   Encounter for Medicare annual wellness exam 1 year Declines Aorta/EKG  Essential hypertension - continue medications, DASH diet, exercise and monitor at home. Call if greater than 130/80.   Other abnormal glucose Discussed disease progression and risks Discussed diet/exercise, weight management and risk modification  Mixed hyperlipidemia check lipids decrease fatty foods increase activity.   Medication management -     CBC with Differential/Platelet -     COMPLETE METABOLIC PANEL WITH GFR  Tobacco abuse Declines low dose CT scan -     DG Chest 2 View; Future  Gastroesophageal reflux disease, esophagitis presence not specified -     pantoprazole (PROTONIX) 40 MG tablet; Take 1 tablet (40 mg total) by mouth daily.  Vitamin D deficiency Continue supplement  Overweight (BMI 25.0-29.9)  Overweight  - long discussion about weight loss, diet, and exercise -recommended diet heavy in fruits and veggies and low in animal meats, cheeses, and dairy products  BPH associated with nocturia Continue meds  Positive colorectal cancer screening using Cologuard test Patient very upset about 649 dollar charge.  Will call cologuard and see if we can help with the bill  Cough -     predniSONE (DELTASONE) 20 MG tablet; 2 tablets daily for 3 days, 1 tablet daily for 4 days. Get on protonix as well  Advanced care planning Discussed in detail with the patient, given information.   Over 30 minutes of exam, counseling, chart review, and critical decision making was performed  Future Appointments  Date Time Provider [place]  05/11/2019  3:00 PM Unk Pinto, MD [facility] None     Plan:   During the course of the visit the patient was educated and counseled about appropriate screening and preventive services including:    Pneumococcal vaccine   Influenza vaccine  Prevnar 13  Td  vaccine  Screening electrocardiogram  Colorectal cancer screening  Diabetes screening  Glaucoma screening  Nutrition counseling    Subjective:  Steven Bray. is a 68 y.o. male who presents for Medicare Annual Wellness Visit and 3 month follow up for HTN, hyperlipidemia, prediabetes, and vitamin D Def.   His blood pressure has been controlled at home ,has had HTN x 2002, today their BP is BP: 120/82.  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 He continues to smoke, he wants to quit. Smoked x 68 years of age, 52 years, 1/2 pack a day or less.   He does not workout. He denies chest pain, shortness of breath, dizziness.  He is on cholesterol medication and denies myalgias. His cholesterol is not at goal less than 100. The cholesterol last visit was:   Lab Results  Component Value Date   CHOL 168 08/17/2018   HDL 37 (L) 08/17/2018   LDLCALC 102 (H) 08/17/2018   TRIG 177 (H) 08/17/2018   CHOLHDL 4.5 08/17/2018   He has been working on diet and exercise for prediabetes (5.7 in 2013), and denies paresthesia of the feet, polydipsia and polyuria. Last A1C in the office was:  Lab Results  Component Value Date   HGBA1C 5.3 04/02/2018   Last GFR Lab Results  Component Value Date   GFRNONAA 72 01/15/2019    Patient is on Vitamin D supplement.   Lab Results  Component Value Date   VD25OH 99 04/02/2018      Medication Review:  Current Outpatient Medications (Endocrine & Metabolic):  .  predniSONE (DELTASONE) 20  MG tablet, 2 tablets daily for 3 days, 1 tablet daily for 4 days.  Current Outpatient Medications (Cardiovascular):  .  atenolol (TENORMIN) 50 MG tablet, Take 1-2 tab daily by mouth for blood pressure goal <130/80. Marland Kitchen  atorvastatin (LIPITOR) 80 MG tablet, Take 1/2 tab three days a week in the evening for cholesterol. .  hydrochlorothiazide (MICROZIDE) 12.5 MG capsule, Take 1 capsule daily for BP & Fluid   Current Outpatient Medications (Analgesics):  .   aspirin 81 MG tablet, Take 81 mg by mouth daily. .  meloxicam (MOBIC) 15 MG tablet, TAKE 1/2 - 1 TABLET BY MOUTH DAILY WITH FOOD FOR PAIN AND INFLAMMATION   Current Outpatient Medications (Other):  .  tamsulosin (FLOMAX) 0.4 MG CAPS capsule, TAKE 1 CAPSULE BY MOUTH EVERY DAY .  Vitamin D, Ergocalciferol, (DRISDOL) 50000 units CAPS capsule, TAKE 1 CAPSULE BY MOUTH EVERY DAY (Patient taking differently: takes 1 capsule a week) .  pantoprazole (PROTONIX) 40 MG tablet, Take 1 tablet (40 mg total) by mouth daily.  Allergies: Allergies  Allergen Reactions  . Sulfa Antibiotics Nausea Only    Current Problems (verified) has Hypertension; Hyperlipidemia; Other abnormal glucose; GERD (gastroesophageal reflux disease); Vitamin D deficiency; Medication management; Tobacco abuse; Overweight (BMI 25.0-29.9); BPH associated with nocturia; and Positive colorectal cancer screening using Cologuard test on their problem list.  Screening Tests Immunization History  Administered Date(s) Administered  . PPD Test 12/13/2013, 12/14/2014, 01/11/2016, 02/28/2017  . Pneumococcal Conjugate-13 08/17/2018  . Pneumococcal-Unspecified 11/24/2008  . Td 11/11/2004  . Tdap 12/14/2014    Preventative care: Last colonoscopy: NEVER- states received bill for 649, patient is very upset + cologuard 05/2018 CT AB 2003 CXR 2014  Prior vaccinations: TD or Tdap: 2016  Influenza: declines Pneumococcal: 2010 Prevnar13: 2019 Shingles/Zostavax: declines  Names of Other Physician/Practitioners you currently use: 1. [facility] here for primary care 2. Dr. Katy Fitch, eye doctor, last visit 1 year ago 3. Goes as needed, dentist on [facility] , dentist, last visit as needed Patient Care Team: Unk Pinto, MD as PCP - General (Internal Medicine)  Surgical: He  has a past surgical history that includes Hernia repair (Right). Family His family history includes Cancer in his mother;  Diabetes in his father and sister; Drug abuse in his son; Early death in his daughter; Hyperlipidemia in his mother; Hypertension in his father and mother; Kidney disease in his father; Stroke in his father. Social history  He reports that he has been smoking cigarettes. He has a 21.00 pack-year smoking history. He has never used smokeless tobacco. He reports that he does not drink alcohol or use drugs.  MEDICARE WELLNESS OBJECTIVES: Physical activity: Current Exercise Habits: The patient does not participate in regular exercise at present(states he stays active) stays active, none Cardiac risk factors: Cardiac Risk Factors include: advanced age (>34men, >13 women);dyslipidemia;hypertension;male gender;sedentary lifestyle;smoking/ tobacco exposure Depression/mood screen:   Depression screen [facility] 2/9 01/18/2019  Decreased Interest 0  Down, Depressed, Hopeless 0  PHQ - 2 Score 0    ADLs:  In your present state of health, do you have any difficulty performing the following activities: 01/18/2019 11/01/2018  Hearing? N N  Vision? N N  Difficulty concentrating or making decisions? N N  Walking or climbing stairs? N N  Dressing or bathing? N N  Doing errands, shopping? N N  Some recent data might be hidden     Cognitive Testing  Alert? Yes  Normal Appearance?Yes  Oriented to person? Yes  Place? Yes   Time? Yes  Recall of three objects?  Yes  Can perform simple calculations? Yes  Displays appropriate judgment?Yes  Can read the correct time from a watch face?Yes  EOL planning: Does Patient Have a Medical Advance Directive?: No Would patient like information on creating a medical advance directive?: Yes (MAU/Ambulatory/Procedural Areas - Information given) No, wants set up   Objective:   Today's Vitals   01/15/19 1019  BP: 120/82  Pulse: (!) 57  Temp: (!) 97.4 F (36.3 C)  SpO2: 97%  Weight: 204 lb 12.8 oz (92.9 kg)  Height: 5' 10.5" (1.791 m)   Body mass index is 28.97  kg/m.  General appearance: alert, no distress, WD/WN, male HEENT: normocephalic, sclerae anicteric, TMs pearly, nares patent, no discharge or erythema, pharynx normal Oral cavity: MMM, no lesions Neck: supple, no lymphadenopathy, no thyromegaly, no masses Heart: RRR, normal S1, S2, no murmurs Lungs: CTA bilaterally, no wheezes, rhonchi, or rales Abdomen: +bs, soft, non tender, non distended, no masses, no hepatomegaly, no splenomegaly Musculoskeletal: nontender, no swelling, no obvious deformity Extremities: no edema, no cyanosis, no clubbing Pulses: 2+ symmetric, upper and lower extremities, normal cap refill Neurological: alert, oriented x 3, CN2-12 intact, strength normal upper extremities and lower extremities, sensation normal throughout, DTRs 2+ throughout, no cerebellar signs, gait normal Psychiatric: normal affect, behavior normal, pleasant   Medicare Attestation I have personally reviewed: The patient's medical and social history Their use of alcohol, tobacco or illicit drugs Their current medications and supplements The patient's functional ability including ADLs,fall risks, home safety risks, cognitive, and hearing and visual impairment Diet and physical activities Evidence for depression or mood disorders  The patient's weight, height, BMI, and visual acuity have been recorded in the chart.  I have made referrals, counseling, and provided education to the patient based on review of the above and I have provided the patient with a written personalized care plan for preventive services.     Steven Mutters, PA-C   01/18/2019

## 2019-01-16 LAB — CBC WITH DIFFERENTIAL/PLATELET
Absolute Monocytes: 762 cells/uL (ref 200–950)
Basophils Absolute: 60 cells/uL (ref 0–200)
Basophils Relative: 1 %
EOS ABS: 252 {cells}/uL (ref 15–500)
Eosinophils Relative: 4.2 %
HCT: 45.9 % (ref 38.5–50.0)
Hemoglobin: 16 g/dL (ref 13.2–17.1)
Lymphs Abs: 1266 cells/uL (ref 850–3900)
MCH: 33.1 pg — ABNORMAL HIGH (ref 27.0–33.0)
MCHC: 34.9 g/dL (ref 32.0–36.0)
MCV: 94.8 fL (ref 80.0–100.0)
MPV: 10.6 fL (ref 7.5–12.5)
Monocytes Relative: 12.7 %
Neutro Abs: 3660 cells/uL (ref 1500–7800)
Neutrophils Relative %: 61 %
PLATELETS: 227 10*3/uL (ref 140–400)
RBC: 4.84 10*6/uL (ref 4.20–5.80)
RDW: 12 % (ref 11.0–15.0)
Total Lymphocyte: 21.1 %
WBC: 6 10*3/uL (ref 3.8–10.8)

## 2019-01-16 LAB — COMPLETE METABOLIC PANEL WITH GFR
AG Ratio: 1.9 (calc) (ref 1.0–2.5)
ALBUMIN MSPROF: 4.1 g/dL (ref 3.6–5.1)
ALT: 16 U/L (ref 9–46)
AST: 17 U/L (ref 10–35)
Alkaline phosphatase (APISO): 64 U/L (ref 35–144)
BUN: 23 mg/dL (ref 7–25)
CO2: 29 mmol/L (ref 20–32)
Calcium: 9.4 mg/dL (ref 8.6–10.3)
Chloride: 104 mmol/L (ref 98–110)
Creat: 1.08 mg/dL (ref 0.70–1.25)
GFR, EST AFRICAN AMERICAN: 83 mL/min/{1.73_m2} (ref >=60)
GFR, Est Non African American: 72 mL/min/{1.73_m2} (ref >=60)
Globulin: 2.2 g/dL (calc) (ref 1.9–3.7)
Glucose, Bld: 78 mg/dL (ref 65–99)
Potassium: 4.1 mmol/L (ref 3.5–5.3)
Sodium: 142 mmol/L (ref 135–146)
TOTAL PROTEIN: 6.3 g/dL (ref 6.1–8.1)
Total Bilirubin: 0.7 mg/dL (ref 0.2–1.2)

## 2019-01-18 ENCOUNTER — Encounter: Payer: Self-pay | Admitting: Physician Assistant

## 2019-03-04 ENCOUNTER — Telehealth: Payer: Medicare Other | Admitting: Physician Assistant

## 2019-03-04 ENCOUNTER — Other Ambulatory Visit: Payer: Self-pay | Admitting: Physician Assistant

## 2019-03-04 DIAGNOSIS — K219 Gastro-esophageal reflux disease without esophagitis: Secondary | ICD-10-CM

## 2019-03-04 DIAGNOSIS — N320 Bladder-neck obstruction: Secondary | ICD-10-CM

## 2019-03-04 DIAGNOSIS — N410 Acute prostatitis: Secondary | ICD-10-CM | POA: Diagnosis not present

## 2019-03-04 MED ORDER — PANTOPRAZOLE SODIUM 40 MG PO TBEC: 40.0000 mg | DELAYED_RELEASE_TABLET | Freq: Every day | ORAL | 1 refills | Status: DC

## 2019-03-04 MED ORDER — TAMSULOSIN HCL 0.4 MG PO CAPS: 0.4000 mg | ORAL_CAPSULE | Freq: Every day | ORAL | 1 refills | Status: DC

## 2019-03-04 MED ORDER — DOXYCYCLINE HYCLATE 100 MG PO CAPS: ORAL_CAPSULE | ORAL | 0 refills | Status: DC

## 2019-03-04 NOTE — Telephone Encounter (Signed)
-----   Message from Elenor Quinones, [place] sent at 03/04/2019  1:44 PM EDT ----- Regarding: PROSTATE Contact: [phone] Per yellow note:       Patient has prostate concerns, he said he has had this issue before having pain.   Would like to speak w/ provider about this issue.

## 2019-03-04 NOTE — Telephone Encounter (Signed)
Virtual Visit via Telephone Note  I connected with Steven Bray. on 03/04/19 at  by telephone and verified that I am speaking with the correct person using two identifiers.   I discussed the limitations, risks, security and privacy concerns of performing an evaluation and management service by telephone and the availability of in person appointments. I also discussed with the patient that there may be a patient responsible charge related to this service. The patient expressed understanding and agreed to proceed.   History of Present Illness: 68 y.o. WM calls with prostate concerns x 2-3 days.  He states he has had this issues a long time in the past. He has urinary frequency, have urinary hesitancy.  No fever, chills. No blood in the urine. No flank pain.  He has had kidney stones in the past but states it does not feel like that. Pain is 4-5.   Allergies  Allergen Reactions  . Sulfa Antibiotics Nausea Only     Medications Current Outpatient Medications on File Prior to Visit  Medication Sig  . aspirin 81 MG tablet Take 81 mg by mouth daily.  Marland Kitchen atenolol (TENORMIN) 50 MG tablet Take 1-2 tab daily by mouth for blood pressure goal <130/80.  Marland Kitchen atorvastatin (LIPITOR) 80 MG tablet Take 1/2 tab three days a week in the evening for cholesterol.  . hydrochlorothiazide (MICROZIDE) 12.5 MG capsule Take 1 capsule daily for BP & Fluid  . meloxicam (MOBIC) 15 MG tablet TAKE 1/2 - 1 TABLET BY MOUTH DAILY WITH FOOD FOR PAIN AND INFLAMMATION  . pantoprazole (PROTONIX) 40 MG tablet Take 1 tablet (40 mg total) by mouth daily.  . predniSONE (DELTASONE) 20 MG tablet 2 tablets daily for 3 days, 1 tablet daily for 4 days.  . tamsulosin (FLOMAX) 0.4 MG CAPS capsule TAKE 1 CAPSULE BY MOUTH EVERY DAY  . Vitamin D, Ergocalciferol, (DRISDOL) 50000 units CAPS capsule TAKE 1 CAPSULE BY MOUTH EVERY DAY (Patient taking differently: takes 1 capsule a week)   No current facility-administered medications on file  prior to visit.     Problem list He has Hypertension; Hyperlipidemia; Other abnormal glucose; GERD (gastroesophageal reflux disease); Vitamin D deficiency; Medication management; Tobacco abuse; Overweight (BMI 25.0-29.9); BPH associated with nocturia; and Positive colorectal cancer screening using Cologuard test on their problem list.  Observations/Objective: General Appearance:Well sounding, in no apparent distress.  ENT/Mouth: No hoarseness, No cough for duration of visit.  Respiratory: completing full sentences without distress, without audible wheeze Neuro: Awake and oriented X 3,  Psych:  Insight and Judgment appropriate.   Assessment and Plan: Diagnoses and all orders for this visit:  Acute prostatitis -     doxycycline (VIBRAMYCIN) 100 MG capsule; Take 1 capsule twice daily with food for prostate infection   Has seen a urologist in the past, Dr. Risa Grill, if not better will refer there.  Increase fluids If any fever, chills, blood in urine, unable to urinate, severe AB pain or worsening symptoms, patient will go to the ER.   Follow Up Instructions: I discussed the assessment and treatment plan with the patient. The patient was provided an opportunity to ask questions and all were answered. The patient agreed with the plan and demonstrated an understanding of the instructions.   The patient was advised to call back or seek an in-person evaluation if the symptoms worsen or if the condition fails to improve as anticipated.  I provided 15 minutes of non-face-to-face time during this encounter.   Vicie Mutters, PA-C  Recent Visits Date Type Provider Dept  01/15/19 Office Visit Vicie Mutters, PA-C [place]  08/17/18 Office Visit Liane Comber, NP [place]  04/02/18 Office Visit Unk Pinto, MD [facility]  01/29/18 Office Visit Liane Comber, NP [facility] recent visits within past 540 days with a meds  authorizing provider and meeting all other requirements   Future Appointments Date Type Provider Dept  05/11/19 Appointment Unk Pinto, MD [facility] future appointments within next 150 days with a meds authorizing provider and meeting all other requirements

## 2019-04-27 ENCOUNTER — Other Ambulatory Visit: Payer: Self-pay | Admitting: Internal Medicine

## 2019-04-27 ENCOUNTER — Other Ambulatory Visit: Payer: Self-pay | Admitting: Adult Health

## 2019-04-27 DIAGNOSIS — I10 Essential (primary) hypertension: Secondary | ICD-10-CM

## 2019-05-10 ENCOUNTER — Encounter: Payer: Self-pay | Admitting: Internal Medicine

## 2019-05-10 NOTE — Progress Notes (Signed)
N  O          S  H  O  W                                                                                                                                                                                                                                                                         This very nice 68 y.o. DWM  presents for a Screening /Preventative Visit & comprehensive evaluation and management of multiple medical co-morbidities.  Patient has been followed for HTN, HLD, Prediabetes and Vitamin D Deficiency. Patient has GERD controlled with  his Pantoprazole.     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  Patient's BP has been controlled at home.  Today's  . Patient denies any cardiac symptoms as chest pain, palpitations, shortness of breath, dizziness or ankle swelling.     Patient's hyperlipidemia is controlled with diet and Atorvastatin. Patient denies myalgias or other medication SE's. Last lipids were near goal: Lab Results  Component Value Date   CHOL 168 08/17/2018   HDL 37 (L) 08/17/2018   LDLCALC 102 (H) 08/17/2018   TRIG 177 (H) 08/17/2018   CHOLHDL 4.5 08/17/2018      Patient has hx/o prediabetes (A1c 5.7% / 2013)  and patient denies reactive hypoglycemic symptoms, visual blurring, diabetic polys or paresthesias. Last A1c was Normal & at goal: Lab Results  Component Value Date   HGBA1C 5.3 04/02/2018       Finally, patient has history of Vitamin D Deficiency ("36" / 2008)  and last vitamin D was at goal: Lab Results  Component Value Date   VD25OH 99 04/02/2018   Current Outpatient Medications on File Prior to Visit  Medication Sig  . aspirin 81 MG  tablet Take 81 mg by mouth daily.  Marland Kitchen atenolol (TENORMIN) 50 MG tablet TAKE 1 TABLET BY MOUTH EVERY DAY  . atorvastatin (LIPITOR) 80 MG tablet Take 1/2 tab three days a week in the evening for cholesterol.  . doxycycline (VIBRAMYCIN) 100 MG capsule Take 1 capsule twice daily with food for prostate infection  . hydrochlorothiazide (  MICROZIDE) 12.5 MG capsule TAKE 1 CAPSULE DAILY FOR BP & FLUID  . meloxicam (MOBIC) 15 MG tablet TAKE 1/2 TO 1 TABLET BY MOUTH EVERY DAY WITH FOOD AS NEEDED FOR PAIN AND INFLAMMATION  . pantoprazole (PROTONIX) 40 MG tablet Take 1 tablet (40 mg total) by mouth daily.  . tamsulosin (FLOMAX) 0.4 MG CAPS capsule Take 1 capsule (0.4 mg total) by mouth daily.  . Vitamin D, Ergocalciferol, (DRISDOL) 50000 units CAPS capsule TAKE 1 CAPSULE BY MOUTH EVERY DAY (Patient taking differently: takes 1 capsule a week)   No current facility-administered medications on file prior to visit.    Allergies  Allergen Reactions  . Sulfa Antibiotics Nausea Only   Past Medical History:  Diagnosis Date  . ED (erectile dysfunction)   . GERD (gastroesophageal reflux disease)   . Hyperlipidemia   . Hypertension   . Pre-diabetes   . Stroke ([place])   . TIA (transient ischemic attack)   . Vitamin D deficiency    Health Maintenance  Topic Date Due  . INFLUENZA VACCINE  06/12/2019  . PNA vac Low Risk Adult (2 of 2 - PPSV23) 08/18/2019  . Fecal DNA (Cologuard)  05/11/2021  . TETANUS/TDAP  12/14/2024  . Hepatitis C Screening  Completed  . HIV Screening  Completed   Immunization History  Administered Date(s) Administered  . PPD Test 12/13/2013, 12/14/2014, 01/11/2016, 02/28/2017  . Pneumococcal Conjugate-13 08/17/2018  . Pneumococcal-Unspecified 11/24/2008  . Td 11/11/2004  . Tdap 12/14/2014   - Last Colon - 12/09/2012 - Recc a 5 yr f/u - Cologard 05/11/2018 - POSITIVE - Patient aware & deferred f/u until on Medicare -  Received recall letter - 07/15/2018

## 2019-05-11 ENCOUNTER — Ambulatory Visit (INDEPENDENT_AMBULATORY_CARE_PROVIDER_SITE_OTHER): Payer: Self-pay | Admitting: Internal Medicine

## 2019-05-11 DIAGNOSIS — Z5329 Procedure and treatment not carried out because of patient's decision for other reasons: Secondary | ICD-10-CM

## 2019-09-27 ENCOUNTER — Encounter: Payer: Self-pay | Admitting: Internal Medicine

## 2019-09-27 NOTE — Patient Instructions (Signed)

## 2019-09-27 NOTE — Progress Notes (Signed)
Annual  Screening/Preventative Visit  & Comprehensive Evaluation & Examination     This very nice 68 y.o. DWM presents for a Screening /Preventative Visit & comprehensive evaluation and management of multiple medical co-morbidities.  Patient has been followed for HTN, HLD, Prediabetes and Vitamin D Deficiency. Patient has GERD controlled on his meds.       Patient had a (+) Cologard in July 2019 and declined GI referral at that time, but now agrees to referral, altho he has concerns about whether his insurance would cover cost of any procedures since he's retired & funds are limited.      HTN predates since 2002. In 2002 he had a small or focal CVA with LUE weakness which recovered. Patient's BP has been controlled at home.  Today's BP is at goal - 122/80. Patient denies any cardiac symptoms as chest pain, palpitations, shortness of breath, dizziness or ankle swelling.     Patient's hyperlipidemia is near controlled with diet and Atorvastatin. Patient denies myalgias or other medication SE's. Last lipids were near goal with also slightly elevated Trig's:  Lab Results  Component Value Date   CHOL 168 08/17/2018   HDL 37 (L) 08/17/2018   LDLCALC 102 (H) 08/17/2018   TRIG 177 (H) 08/17/2018   CHOLHDL 4.5 08/17/2018      Patient has hx/o prediabetes  (A1c 5.7% / 2013) and patient denies reactive hypoglycemic symptoms, visual blurring, diabetic polys or paresthesias. Last A1c was Normal & at goal:  Lab Results  Component Value Date   HGBA1C 5.3 04/02/2018       Finally, patient has history of Vitamin D Deficiency ("36" / 2008)  and last vitamin D was at goal:  Lab Results  Component Value Date   VD25OH 99 04/02/2018   Current Outpatient Medications on File Prior to Visit  Medication Sig  . aspirin 81 MG tablet Take 81 mg by mouth daily.  Marland Kitchen atenolol (TENORMIN) 50 MG tablet TAKE 1 TABLET BY MOUTH EVERY DAY  . atorvastatin (LIPITOR) 80 MG tablet Take 1/2 tab three days a week in the  evening for cholesterol.  . Cyanocobalamin (VITAMIN B 12) 500 MCG TABS Take 1 tablet by mouth daily.  . hydrochlorothiazide (MICROZIDE) 12.5 MG capsule TAKE 1 CAPSULE DAILY FOR BP & FLUID  . meloxicam (MOBIC) 15 MG tablet TAKE 1/2 TO 1 TABLET BY MOUTH EVERY DAY WITH FOOD AS NEEDED FOR PAIN AND INFLAMMATION  . pantoprazole (PROTONIX) 40 MG tablet Take 1 tablet (40 mg total) by mouth daily.  . tamsulosin (FLOMAX) 0.4 MG CAPS capsule Take 1 capsule (0.4 mg total) by mouth daily.  . Vitamin D, Ergocalciferol, (DRISDOL) 50000 units CAPS capsule TAKE 1 CAPSULE BY MOUTH EVERY DAY (Patient taking differently: takes 1 capsule a week)   No current facility-administered medications on file prior to visit.    Allergies  Allergen Reactions  . Sulfa Antibiotics Nausea Only   Past Medical History:  Diagnosis Date  . ED (erectile dysfunction)   . GERD (gastroesophageal reflux disease)   . Hyperlipidemia   . Hypertension   . Pre-diabetes   . Stroke ([place])   . TIA (transient ischemic attack)   . Vitamin D deficiency    Health Maintenance  Topic Date Due  . INFLUENZA VACCINE  06/12/2019  . PNA vac Low Risk Adult (2 of 2 - PPSV23) 08/18/2019  . Fecal DNA (Cologuard)  05/11/2021  . TETANUS/TDAP  12/14/2024  . Hepatitis C Screening  Completed   Immunization History  Administered Date(s) Administered  . PPD Test 12/13/2013, 12/14/2014, 01/11/2016, 02/28/2017  . Pneumococcal Conjugate-13 08/17/2018  . Pneumococcal Polysaccharide-23 09/28/2019  . Pneumococcal-Unspecified 11/24/2008  . Td 11/11/2004  . Tdap 12/14/2014   Colonoscopy - Refused in past  Cologard -  (+) Positive  07/01//2019 - declined GI Referral   Past Surgical History:  Procedure Laterality Date  . HERNIA REPAIR Right    INGUINAL   Family History  Problem Relation Age of Onset  . Cancer Mother        LYMPHOMA  . Hyperlipidemia Mother   . Hypertension Mother   . Diabetes Father   . Hypertension Father   . Stroke Father    . Kidney disease Father   . Diabetes Sister   . Early death Daughter   . Drug abuse Son        DIED FROM OD   Social History   Socioeconomic History  . Marital status: Divorced x 2  . Number of children: 1 son died age 76 from Drug OD  Occupational History  . Small Building surveyor  Tobacco Use  . Smoking status: Current Every Day Smoker    Packs/day: 0.50    Years: 42.00    Pack years: 21.00    Types: Cigarettes  . Smokeless tobacco: Never Used  . Tobacco comment: trying to quit and smoking 2 to 5 cigarettes a day.  Substance and Sexual Activity  . Alcohol use: No    Alcohol/week: 0.0 standard drinks    Comment: OCC  . Drug use: No  . Sexual activity: Yes    Birth control/protection: None    ROS Constitutional: Denies fever, chills, weight loss/gain, headaches, insomnia,  night sweats or change in appetite. Does c/o fatigue. Eyes: Denies redness, blurred vision, diplopia, discharge, itchy or watery eyes.  ENT: Denies discharge, congestion, post nasal drip, epistaxis, sore throat, earache, hearing loss, dental pain, Tinnitus, Vertigo, Sinus pain or snoring.  Cardio: Denies chest pain, palpitations, irregular heartbeat, syncope, dyspnea, diaphoresis, orthopnea, PND, claudication or edema Respiratory: denies cough, dyspnea, DOE, pleurisy, hoarseness, laryngitis or wheezing.  Gastrointestinal: Denies dysphagia, heartburn, reflux, water brash, pain, cramps, nausea, vomiting, bloating, diarrhea, constipation, hematemesis, melena, hematochezia, jaundice or hemorrhoids Genitourinary: Denies dysuria, frequency,  discharge, hematuria or flank pain. Has urgency, nocturia x 2-3 & occasional hesitancy. Musculoskeletal: Denies arthralgia, myalgia, stiffness, Jt. Swelling, pain, limp or strain/sprain. Denies Falls. Skin: Denies puritis, rash, hives, warts, acne, eczema or change in skin lesion Neuro: No weakness, tremor, incoordination, spasms, paresthesia or pain Psychiatric: Denies  confusion, memory loss or sensory loss. Denies Depression. Endocrine: Denies change in weight, skin, hair change, nocturia, and paresthesia, diabetic polys, visual blurring or hyper / hypo glycemic episodes.  Heme/Lymph: No excessive bleeding, bruising or enlarged lymph nodes.  Physical Exam  BP 122/80   Pulse 64   Temp (!) 97.2 F (36.2 C)   Resp 18   Ht 5' 10.5" (1.791 m)   Wt 206 lb 3.2 oz (93.5 kg)   BMI 29.17 kg/m   General Appearance: Well nourished and well groomed and in no apparent distress.  Eyes: PERRLA, EOMs, conjunctiva no swelling or erythema, normal fundi and vessels. Sinuses: No frontal/maxillary tenderness ENT/Mouth: EACs patent / TMs  nl. Nares clear without erythema, swelling, mucoid exudates. Oral hygiene is good. No erythema, swelling, or exudate. Tongue normal, non-obstructing. Tonsils not swollen or erythematous. Hearing normal.  Neck: Supple, thyroid not palpable. No bruits, nodes or JVD. Respiratory: Respiratory effort normal.  BS equal and  clear bilateral without rales, rhonci, wheezing or stridor. Cardio: Heart sounds are normal with regular rate and rhythm and no murmurs, rubs or gallops. Peripheral pulses are normal and equal bilaterally without edema. No aortic or femoral bruits. Chest: symmetric with normal excursions and percussion.  Abdomen: Soft, with Nl bowel sounds. Nontender, no guarding, rebound, hernias, masses, or organomegaly.  Lymphatics: Non tender without lymphadenopathy.  Musculoskeletal: Full ROM all peripheral extremities, joint stability, 5/5 strength, and normal gait. Skin: Warm and dry without rashes, lesions, cyanosis, clubbing or  ecchymosis.  Neuro: Cranial nerves intact, reflexes equal bilaterally. Normal muscle tone, no cerebellar symptoms. Sensation intact.  Pysch: Alert and oriented X 3 with normal affect, insight and judgment appropriate.   Assessment and Plan  1. Annual Preventative/Screening Exam   2. Essential  hypertension  - EKG 12-Lead - [place], retroperitnl abd,  ltd - Urinalysis, Routine w reflex microscopic - Microalbumin / Creatinine Urine Ratio - CBC with Diff - COMPLETE METABOLIC PANEL WITH GFR - Magnesium - TSH  3. Hyperlipidemia, mixed  - EKG 12-Lead - [place], retroperitnl abd,  ltd - Lipid Profile - TSH  4. Abnormal glucose  - EKG 12-Lead - [place], retroperitnl abd,  ltd - Hemoglobin A1c ([facility]) - Insulin, random  5. Vitamin D deficiency  - Vitamin D (25 hydroxy)  6. Screening for colorectal cancer  - POC Hemoccult Bld/Stl  7. BPH with obstruction/lower urinary tract symptoms  - PSA  8. Prostate cancer screening  - PSA  9. Screening for ischemic heart disease  - EKG 12-Lead  10. FHx: heart disease  - EKG 12-Lead - [place], retroperitnl abd,  ltd  11. Smoker  - EKG 12-Lead - [place], retroperitnl abd,  ltd  12. Screening for AAA (aortic abdominal aneurysm)  - [place], retroperitnl abd,  ltd  13. Medication management  - Urinalysis, Routine w reflex microscopic - Microalbumin / Creatinine Urine Ratio - CBC with Diff - COMPLETE METABOLIC PANEL WITH GFR - Magnesium - Lipid Profile - TSH - Hemoglobin A1c ([facility]) - Insulin, random - Vitamin D (25 hydroxy)        Patient was counseled in prudent diet, weight control to achieve/maintain BMI less than 25, BP monitoring, regular exercise and medications as discussed.  Discussed med effects and SE's. Routine screening labs and tests as requested with regular follow-up as recommended. Over 40 minutes of exam, counseling, chart review and high complex critical decision making was performed   Kirtland Bouchard, MD

## 2019-09-28 ENCOUNTER — Encounter: Payer: Medicare Other | Admitting: Internal Medicine

## 2019-09-28 ENCOUNTER — Ambulatory Visit (INDEPENDENT_AMBULATORY_CARE_PROVIDER_SITE_OTHER): Payer: Medicare Other | Admitting: Internal Medicine

## 2019-09-28 ENCOUNTER — Other Ambulatory Visit: Payer: Self-pay

## 2019-09-28 VITALS — BP 122/80 | HR 64 | Temp 97.2°F | Resp 18 | Ht 70.5 in | Wt 206.2 lb

## 2019-09-28 DIAGNOSIS — E782 Mixed hyperlipidemia: Secondary | ICD-10-CM | POA: Diagnosis not present

## 2019-09-28 DIAGNOSIS — Z136 Encounter for screening for cardiovascular disorders: Secondary | ICD-10-CM

## 2019-09-28 DIAGNOSIS — Z79899 Other long term (current) drug therapy: Secondary | ICD-10-CM

## 2019-09-28 DIAGNOSIS — Z1212 Encounter for screening for malignant neoplasm of rectum: Secondary | ICD-10-CM

## 2019-09-28 DIAGNOSIS — R195 Other fecal abnormalities: Secondary | ICD-10-CM

## 2019-09-28 DIAGNOSIS — Z23 Encounter for immunization: Secondary | ICD-10-CM

## 2019-09-28 DIAGNOSIS — N401 Enlarged prostate with lower urinary tract symptoms: Secondary | ICD-10-CM

## 2019-09-28 DIAGNOSIS — Z0001 Encounter for general adult medical examination with abnormal findings: Secondary | ICD-10-CM

## 2019-09-28 DIAGNOSIS — Z0000 Encounter for general adult medical examination without abnormal findings: Secondary | ICD-10-CM | POA: Diagnosis not present

## 2019-09-28 DIAGNOSIS — Z125 Encounter for screening for malignant neoplasm of prostate: Secondary | ICD-10-CM

## 2019-09-28 DIAGNOSIS — E559 Vitamin D deficiency, unspecified: Secondary | ICD-10-CM

## 2019-09-28 DIAGNOSIS — R7309 Other abnormal glucose: Secondary | ICD-10-CM | POA: Diagnosis not present

## 2019-09-28 DIAGNOSIS — Z1211 Encounter for screening for malignant neoplasm of colon: Secondary | ICD-10-CM

## 2019-09-28 DIAGNOSIS — N138 Other obstructive and reflux uropathy: Secondary | ICD-10-CM

## 2019-09-28 DIAGNOSIS — I10 Essential (primary) hypertension: Secondary | ICD-10-CM

## 2019-09-28 DIAGNOSIS — Z8249 Family history of ischemic heart disease and other diseases of the circulatory system: Secondary | ICD-10-CM

## 2019-09-28 DIAGNOSIS — F17200 Nicotine dependence, unspecified, uncomplicated: Secondary | ICD-10-CM

## 2019-09-28 MED ORDER — PREDNISONE 20 MG PO TABS: ORAL_TABLET | ORAL | 1 refills | Status: DC

## 2019-09-28 MED ORDER — TADALAFIL 20 MG PO TABS: ORAL_TABLET | ORAL | 12 refills | Status: DC

## 2019-09-29 LAB — CBC WITH DIFFERENTIAL/PLATELET
Absolute Monocytes: 534 cells/uL (ref 200–950)
Basophils Absolute: 52 cells/uL (ref 0–200)
Basophils Relative: 0.9 %
Eosinophils Absolute: 313 cells/uL (ref 15–500)
Eosinophils Relative: 5.4 %
HCT: 48.4 % (ref 38.5–50.0)
Hemoglobin: 16.2 g/dL (ref 13.2–17.1)
Lymphs Abs: 1148 cells/uL (ref 850–3900)
MCH: 32 pg (ref 27.0–33.0)
MCHC: 33.5 g/dL (ref 32.0–36.0)
MCV: 95.7 fL (ref 80.0–100.0)
MPV: 10.7 fL (ref 7.5–12.5)
Monocytes Relative: 9.2 %
Neutro Abs: 3753 cells/uL (ref 1500–7800)
Neutrophils Relative %: 64.7 %
Platelets: 210 10*3/uL (ref 140–400)
RBC: 5.06 10*6/uL (ref 4.20–5.80)
RDW: 12.8 % (ref 11.0–15.0)
Total Lymphocyte: 19.8 %
WBC: 5.8 10*3/uL (ref 3.8–10.8)

## 2019-09-29 LAB — COMPLETE METABOLIC PANEL WITH GFR
AG Ratio: 1.8 (calc) (ref 1.0–2.5)
ALT: 20 U/L (ref 9–46)
AST: 18 U/L (ref 10–35)
Albumin: 4.2 g/dL (ref 3.6–5.1)
Alkaline phosphatase (APISO): 76 U/L (ref 35–144)
BUN: 23 mg/dL (ref 7–25)
CO2: 30 mmol/L (ref 20–32)
Calcium: 9.6 mg/dL (ref 8.6–10.3)
Chloride: 103 mmol/L (ref 98–110)
Creat: 0.98 mg/dL (ref 0.70–1.25)
GFR, Est African American: 93 mL/min/{1.73_m2} (ref >=60)
GFR, Est Non African American: 80 mL/min/{1.73_m2} (ref >=60)
Globulin: 2.4 g/dL (calc) (ref 1.9–3.7)
Glucose, Bld: 105 mg/dL — ABNORMAL HIGH (ref 65–99)
Potassium: 4.1 mmol/L (ref 3.5–5.3)
Sodium: 141 mmol/L (ref 135–146)
Total Bilirubin: 0.5 mg/dL (ref 0.2–1.2)
Total Protein: 6.6 g/dL (ref 6.1–8.1)

## 2019-09-29 LAB — LIPID PANEL
Cholesterol: 142 mg/dL (ref <200)
HDL: 37 mg/dL — ABNORMAL LOW (ref >=40)
LDL Cholesterol (Calc): 78 mg/dL (calc)
Non-HDL Cholesterol (Calc): 105 mg/dL (calc) (ref <130)
Total CHOL/HDL Ratio: 3.8 (calc) (ref <5.0)
Triglycerides: 172 mg/dL — ABNORMAL HIGH (ref <150)

## 2019-09-29 LAB — URINALYSIS, ROUTINE W REFLEX MICROSCOPIC
Bilirubin Urine: NEGATIVE
Glucose, UA: NEGATIVE
Hgb urine dipstick: NEGATIVE
Ketones, ur: NEGATIVE
Leukocytes,Ua: NEGATIVE
Nitrite: NEGATIVE
Protein, ur: NEGATIVE
Specific Gravity, Urine: 1.023 (ref 1.001–1.03)
pH: <=5 (ref 5.0–8.0)

## 2019-09-29 LAB — INSULIN, RANDOM: Insulin: 29.3 u[IU]/mL — ABNORMAL HIGH

## 2019-09-29 LAB — TSH: TSH: 1.32 mIU/L (ref 0.40–4.50)

## 2019-09-29 LAB — HEMOGLOBIN A1C
Hgb A1c MFr Bld: 5.4 % of total Hgb (ref <5.7)
Mean Plasma Glucose: 108 (calc)
eAG (mmol/L): 6 (calc)

## 2019-09-29 LAB — MICROALBUMIN / CREATININE URINE RATIO
Creatinine, Urine: 217 mg/dL (ref 20–320)
Microalb Creat Ratio: 4 mcg/mg creat (ref <30)
Microalb, Ur: 0.9 mg/dL

## 2019-09-29 LAB — VITAMIN D 25 HYDROXY (VIT D DEFICIENCY, FRACTURES): Vit D, 25-Hydroxy: 134 ng/mL — ABNORMAL HIGH (ref 30–100)

## 2019-09-29 LAB — MAGNESIUM: Magnesium: 1.9 mg/dL (ref 1.5–2.5)

## 2019-09-29 LAB — PSA: PSA: 0.3 ng/mL (ref <=4.0)

## 2019-09-30 ENCOUNTER — Telehealth: Payer: Self-pay | Admitting: *Deleted

## 2019-09-30 NOTE — Telephone Encounter (Signed)
Patient called and reported his BP readings that range from 86/57 to 139/80. Per Dr Melford Aase, he should take Atenolol 50 mg 1/2 tablet twice a day and check his blood pressure twice daily.

## 2019-10-11 ENCOUNTER — Other Ambulatory Visit: Payer: Self-pay | Admitting: Physician Assistant

## 2019-10-11 ENCOUNTER — Other Ambulatory Visit: Payer: Self-pay | Admitting: Internal Medicine

## 2019-10-11 DIAGNOSIS — N320 Bladder-neck obstruction: Secondary | ICD-10-CM

## 2019-10-11 DIAGNOSIS — K219 Gastro-esophageal reflux disease without esophagitis: Secondary | ICD-10-CM

## 2019-11-04 ENCOUNTER — Encounter: Payer: Self-pay | Admitting: Internal Medicine

## 2020-01-01 ENCOUNTER — Other Ambulatory Visit: Payer: Self-pay | Admitting: Adult Health

## 2020-01-03 NOTE — Progress Notes (Signed)
ANNUAL MEDICARE ANNUAL WELLNESS VISIT AND FOLLOW UP Assessment:   Encounter for Medicare annual wellness exam 1 year  Essential hypertension - continue medications, DASH diet, exercise and monitor at home. Call if greater than 130/80.   Other abnormal glucose Discussed disease progression and risks Discussed diet/exercise, weight management and risk modification  Mixed hyperlipidemia check lipids decrease fatty foods increase activity.   Medication management -     CBC with Differential/Platelet -     COMPLETE METABOLIC PANEL WITH GFR  Tobacco abuse Declines low dose CT scan CXR ordered Discussed risks associated with tobacco use and advised to reduce or quit Patient is not ready to do so, but advised to consider strongly Will follow up at the next visit  Gastroesophageal reflux disease, esophagitis presence not specified Well managed on current medications Discussed diet, avoiding triggers and other lifestyle changes  Vitamin D deficiency Continue supplement  Overweight (BMI 25.0-29.9) - long discussion about weight loss, diet, and exercise -recommended diet heavy in fruits and veggies and low in animal meats, cheeses, and dairy products  BPH associated with nocturia Continue meds  Positive colorectal cancer screening using Cologuard test Willing to see GI but wants to check cost/coverage with insurance first, can't afford any significant copay GI referral information provided; encouraged to follow up ASAP    Advanced care planning Discussed in detail with the patient, given information.   Over 30 minutes of exam, counseling, chart review, and critical decision making was performed  Future Appointments  Date Time Provider [place]  04/11/2020 11:30 AM Unk Pinto, MD [facility] None  10/26/2020  3:00 PM Unk Pinto, MD [facility] None     Plan:   During the course of the visit the patient was educated and counseled about appropriate  screening and preventive services including:    Pneumococcal vaccine   Influenza vaccine  Prevnar 13  Td vaccine  Screening electrocardiogram  Colorectal cancer screening  Diabetes screening  Glaucoma screening  Nutrition counseling    Subjective:  Steven Bray. is a 69 y.o. male who presents for Medicare Annual Wellness Visit and 3 month follow up for HTN, hyperlipidemia, prediabetes, and vitamin D Def.   His mom passed away 2023/12/06 after having a stroke.    He continues to smoke, he wants to quit. Smoked x 69 years of age, 54 years, 1/2 pack a day or less.  Low dose CT was discussed but declined by patient; CXR was ordered in March 2020 but pending; discussed and he is willing to get.   he has a diagnosis of GERD which is currently managed by protonix 40 mg daily  he reports symptoms is currently well controlled, and denies breakthrough reflux, burning in chest, hoarseness or cough.    BMI is Body mass index is 30.13 kg/m., he has not been working on diet, exercise limited due to weather, but plans to restart walking with weather warming up.  Wt Readings from Last 3 Encounters:  01/04/20 213 lb (96.6 kg)  09/28/19 206 lb 3.2 oz (93.5 kg)  01/15/19 204 lb 12.8 oz (92.9 kg)   His blood pressure has been controlled at home ,has had HTN x 2002, today their BP is BP: 112/70.  He does not workout. He denies chest pain, shortness of breath, dizziness.   He is on cholesterol medication (atorvastatin 40 mg MWF) and denies myalgias. His cholesterol is at goal less than 100. The cholesterol last visit was:   Lab Results  Component Value Date  CHOL 142 09/28/2019   HDL 37 (L) 09/28/2019   LDLCALC 78 09/28/2019   TRIG 172 (H) 09/28/2019   CHOLHDL 3.8 09/28/2019   He has been working on diet and exercise for hx of prediabetes (5.7 in 2013), and denies paresthesia of the feet, polydipsia and polyuria. Last A1C in the office was:  Lab Results  Component Value Date    HGBA1C 5.4 09/28/2019   Last GFR Lab Results  Component Value Date   GFRNONAA 80 09/28/2019    Patient is on Vitamin D supplement, was taking 50000 IU weekly, now taking 10000 IU daily.  Lab Results  Component Value Date   VD25OH 134 (H) 09/28/2019      Medication Review:   Current Outpatient Medications (Cardiovascular):  .  atenolol (TENORMIN) 50 MG tablet, TAKE 1 TABLET BY MOUTH EVERY DAY (Patient taking differently: Take 25mg  in the morning and 25mg  in the evening) .  atorvastatin (LIPITOR) 40 MG tablet, Take 1 tablet 3 days  /week on Mon Wed & Fri for Cholesterol .  hydrochlorothiazide (MICROZIDE) 12.5 MG capsule, TAKE 1 CAPSULE DAILY FOR BP & FLUID .  tadalafil (CIALIS) 20 MG tablet, Take 1/2 to 1 tablet every 2 to 3 days as needed for XXXX   Current Outpatient Medications (Analgesics):  .  aspirin 81 MG tablet, Take 81 mg by mouth daily. .  meloxicam (MOBIC) 15 MG tablet, Take 1/2 to 1 tablet Daily with Food for Pain & Inflammation  Current Outpatient Medications (Hematological):  Marland Kitchen  Cyanocobalamin (VITAMIN B 12) 500 MCG TABS, Take 1 tablet by mouth daily.  Current Outpatient Medications (Other):  .  pantoprazole (PROTONIX) 40 MG tablet, Take 1 tablet Daily for Indigestion & Heartburn .  tamsulosin (FLOMAX) 0.4 MG CAPS capsule, Take 1 capsule Daily for Prostate .  Vitamin D, Ergocalciferol, (DRISDOL) 50000 units CAPS capsule, TAKE 1 CAPSULE BY MOUTH EVERY DAY (Patient taking differently: Take 10,000 units daily)  Allergies: Allergies  Allergen Reactions  . Sulfa Antibiotics Nausea Only    Current Problems (verified) has Hypertension; Hyperlipidemia; Other abnormal glucose; GERD (gastroesophageal reflux disease); Vitamin D deficiency; Medication management; Tobacco abuse; Overweight (BMI 25.0-29.9); BPH associated with nocturia; and Positive colorectal cancer screening using Cologuard test on their problem list.  Screening Tests Immunization History  Administered  Date(s) Administered  . PPD Test 12/13/2013, 12/14/2014, 01/11/2016, 02/28/2017  . Pneumococcal Conjugate-13 08/17/2018  . Pneumococcal Polysaccharide-23 09/28/2019  . Pneumococcal-Unspecified 11/24/2008  . Td 11/11/2004  . Tdap 12/14/2014    Preventative care: Last colonoscopy: NEVER- states received bill for 649, patient is very upset + cologuard 05/2018, was referred to Dr. Carlean Purl, didn't go due to mom's health, wants to check with insurance first CT AB 2003 CXR 2014, ORDER ENTERED  Prior vaccinations: TD or Tdap: 2016  Influenza: declines, poor reaction in the past Pneumococcal: 2010, 2020 Prevnar13: 2019 Shingles/Zostavax: declines  Names of Other Physician/Practitioners you currently use: 1. [facility] here for primary care 2. Dr. Katy Fitch, eye doctor, last visit ?, overdue, encouraged to schedule 3. Goes as needed, dentist, last ?, has partials  Patient Care Team: Unk Pinto, MD as PCP - General (Internal Medicine) Gatha Mayer, MD as Consulting Physician (Gastroenterology)  Surgical: He  has a past surgical history that includes Hernia repair (Right). Family His family history includes Cancer in his mother; Diabetes in his father and sister; Drug abuse in his son; Early death in his daughter; Hyperlipidemia in his mother; Hypertension in his  father and mother; Kidney disease in his father; Stroke in his father. Social history  He reports that he has been smoking cigarettes. He has a 21.00 pack-year smoking history. He has never used smokeless tobacco. He reports that he does not drink alcohol or use drugs.  MEDICARE WELLNESS OBJECTIVES: Physical activity: Exercise limited by: None identified stays active, none Cardiac risk factors: Cardiac Risk Factors include: advanced age (>13men, >2 women);dyslipidemia;hypertension;obesity (BMI >30kg/m2);sedentary lifestyle;smoking/ tobacco exposure Depression/mood screen:    Depression screen [facility] 2/9 01/04/2020  Decreased Interest 0  Down, Depressed, Hopeless 0  PHQ - 2 Score 0    ADLs:  In your present state of health, do you have any difficulty performing the following activities: 01/04/2020 09/27/2019  Hearing? N N  Vision? N N  Difficulty concentrating or making decisions? N N  Walking or climbing stairs? N N  Dressing or bathing? N N  Doing errands, shopping? N N  Some recent data might be hidden     Cognitive Testing  Alert? Yes  Normal Appearance?Yes  Oriented to person? Yes  Place? Yes   Time? Yes  Recall of three objects?  Yes  Can perform simple calculations? Yes  Displays appropriate judgment?Yes  Can read the correct time from a watch face?Yes  EOL planning: Does Patient Have a Medical Advance Directive?: No Would patient like information on creating a medical advance directive?: Yes (MAU/Ambulatory/Procedural Areas - Information given) No, wants set up   Objective:   Today's Vitals   01/04/20 1108  BP: 112/70  Pulse: (!) 58  Temp: (!) 96.8 F (36 C)  SpO2: 97%  Weight: 213 lb (96.6 kg)  Height: 5' 10.5" (1.791 m)   Body mass index is 30.13 kg/m.  General appearance: alert, no distress, WD/WN, male HEENT: normocephalic, sclerae anicteric, TMs pearly, nares patent, no discharge or erythema, pharynx normal Oral cavity: MMM, no lesions Neck: supple, no lymphadenopathy, no thyromegaly, no masses Heart: RRR, normal S1, S2, no murmurs Lungs: CTA bilaterally, no wheezes, rhonchi, or rales Abdomen: +bs, soft, non tender, non distended, no masses, no hepatomegaly, no splenomegaly Musculoskeletal: nontender, no swelling, no obvious deformity Extremities: no edema, no cyanosis, no clubbing Pulses: 2+ symmetric, upper and lower extremities, normal cap refill Neurological: alert, oriented x 3, CN2-12 intact, strength normal upper extremities and lower extremities, sensation normal throughout, DTRs 2+ throughout, no cerebellar signs,  gait normal Psychiatric: normal affect, behavior normal, pleasant   Medicare Attestation I have personally reviewed: The patient's medical and social history Their use of alcohol, tobacco or illicit drugs Their current medications and supplements The patient's functional ability including ADLs,fall risks, home safety risks, cognitive, and hearing and visual impairment Diet and physical activities Evidence for depression or mood disorders  The patient's weight, height, BMI, and visual acuity have been recorded in the chart.  I have made referrals, counseling, and provided education to the patient based on review of the above and I have provided the patient with a written personalized care plan for preventive services.     Izora Ribas, NP   01/04/2020

## 2020-01-04 ENCOUNTER — Ambulatory Visit (INDEPENDENT_AMBULATORY_CARE_PROVIDER_SITE_OTHER): Payer: Medicare Other | Admitting: Adult Health

## 2020-01-04 ENCOUNTER — Other Ambulatory Visit: Payer: Self-pay

## 2020-01-04 ENCOUNTER — Encounter: Payer: Self-pay | Admitting: Adult Health

## 2020-01-04 VITALS — BP 112/70 | HR 58 | Temp 96.8°F | Ht 70.5 in | Wt 213.0 lb

## 2020-01-04 DIAGNOSIS — R7309 Other abnormal glucose: Secondary | ICD-10-CM

## 2020-01-04 DIAGNOSIS — Z0001 Encounter for general adult medical examination with abnormal findings: Secondary | ICD-10-CM | POA: Diagnosis not present

## 2020-01-04 DIAGNOSIS — E663 Overweight: Secondary | ICD-10-CM | POA: Diagnosis not present

## 2020-01-04 DIAGNOSIS — E559 Vitamin D deficiency, unspecified: Secondary | ICD-10-CM | POA: Diagnosis not present

## 2020-01-04 DIAGNOSIS — K219 Gastro-esophageal reflux disease without esophagitis: Secondary | ICD-10-CM | POA: Diagnosis not present

## 2020-01-04 DIAGNOSIS — R351 Nocturia: Secondary | ICD-10-CM

## 2020-01-04 DIAGNOSIS — I10 Essential (primary) hypertension: Secondary | ICD-10-CM | POA: Diagnosis not present

## 2020-01-04 DIAGNOSIS — N401 Enlarged prostate with lower urinary tract symptoms: Secondary | ICD-10-CM

## 2020-01-04 DIAGNOSIS — E782 Mixed hyperlipidemia: Secondary | ICD-10-CM | POA: Diagnosis not present

## 2020-01-04 DIAGNOSIS — Z720 Tobacco use: Secondary | ICD-10-CM | POA: Diagnosis not present

## 2020-01-04 DIAGNOSIS — Z79899 Other long term (current) drug therapy: Secondary | ICD-10-CM | POA: Diagnosis not present

## 2020-01-04 DIAGNOSIS — R6889 Other general symptoms and signs: Secondary | ICD-10-CM | POA: Diagnosis not present

## 2020-01-04 DIAGNOSIS — Z0000 Encounter for general adult medical examination without abnormal findings: Secondary | ICD-10-CM

## 2020-01-04 DIAGNOSIS — R195 Other fecal abnormalities: Secondary | ICD-10-CM

## 2020-01-04 NOTE — Patient Instructions (Addendum)
Steven Bray , Thank you for taking time to come for your Medicare Wellness Visit. I appreciate your ongoing commitment to your health goals. Please review the following plan we discussed and let me know if I can assist you in the future.   These are the goals we discussed: Goals    . Exercise 150 min/wk Moderate Activity       This is a list of the screening recommended for you and due dates:  Health Maintenance  Topic Date Due  . Flu Shot  02/09/2020*  . Cologuard (Stool DNA test)  05/11/2021  . Tetanus Vaccine  12/14/2024  .  Hepatitis C: One time screening is recommended by Center for Disease Control  (CDC) for  adults born from [birth year] through 1963.   Completed  . Pneumonia vaccines  Completed  *Topic was postponed. The date shown is not the original due date.     Please go to [address] [facility] to get a chest xray   Please stop vitamin D supplement for 2 weeks - then restart 10000 IU three days a week.   Please check with insurance about colonoscopy ASAP and let us know   Referral GI was Dr. Carlean Purl [phone]  Please schedule an eye exam for this year     Exercising to Stay Healthy To become healthy and stay healthy, it is recommended that you do moderate-intensity and vigorous-intensity exercise. You can tell that you are exercising at a moderate intensity if your heart starts beating faster and you start breathing faster but can still hold a conversation. You can tell that you are exercising at a vigorous intensity if you are breathing much harder and faster and cannot hold a conversation while exercising. Exercising regularly is important. It has many health benefits, such as:  Improving overall fitness, flexibility, and endurance.  Increasing bone density.  Helping with weight control.  Decreasing body fat.  Increasing muscle strength.  Reducing stress and tension.  Improving overall health. How often should I exercise? Choose an activity  that you enjoy, and set realistic goals. Your health care provider can help you make an activity plan that works for you. Exercise regularly as told by your health care provider. This may include:  Doing strength training two times a week, such as: ? Lifting weights. ? Using resistance bands. ? Push-ups. ? Sit-ups. ? Yoga.  Doing a certain intensity of exercise for a given amount of time. Choose from these options: ? A total of 150 minutes of moderate-intensity exercise every week. ? A total of 75 minutes of vigorous-intensity exercise every week. ? A mix of moderate-intensity and vigorous-intensity exercise every week. Children, pregnant women, people who have not exercised regularly, people who are overweight, and older adults may need to talk with a health care provider about what activities are safe to do. If you have a medical condition, be sure to talk with your health care provider before you start a new exercise program. What are some exercise ideas? Moderate-intensity exercise ideas include:  Walking 1 mile (1.6 km) in about 15 minutes.  Biking.  Hiking.  Golfing.  Dancing.  Water aerobics. Vigorous-intensity exercise ideas include:  Walking 4.5 miles (7.2 km) or more in about 1 hour.  Jogging or running 5 miles (8 km) in about 1 hour.  Biking 10 miles (16.1 km) or more in about 1 hour.  Lap swimming.  Roller-skating or in-line skating.  Cross-country skiing.  Vigorous competitive sports, such as football, basketball,  and soccer.  Jumping rope.  Aerobic dancing. What are some everyday activities that can help me to get exercise?  [place] work, such as: ? Pushing a Conservation officer, nature. ? Raking and bagging leaves.  Washing your car.  Pushing a stroller.  Shoveling snow.  Gardening.  Washing windows or floors. How can I be more active in my day-to-day activities?  Use stairs instead of an elevator.  Take a walk during your lunch break.  If you drive,  park your car farther away from your work or school.  If you take public transportation, get off one stop early and walk the rest of the way.  Stand up or walk around during all of your indoor phone calls.  Get up, stretch, and walk around every 30 minutes throughout the day.  Enjoy exercise with a friend. Support to continue exercising will help you keep a regular routine of activity. What guidelines can I follow while exercising?  Before you start a new exercise program, talk with your health care provider.  Do not exercise so much that you hurt yourself, feel dizzy, or get very short of breath.  Wear comfortable clothes and wear shoes with good support.  Drink plenty of water while you exercise to prevent dehydration or heat stroke.  Work out until your breathing and your heartbeat get faster. Where to find more information  U.S. Department of Health and Human Services: [employer]at  Centers for Disease Control and Prevention (CDC): http://www.wolf.info/ Summary  Exercising regularly is important. It will improve your overall fitness, flexibility, and endurance.  Regular exercise also will improve your overall health. It can help you control your weight, reduce stress, and improve your bone density.  Do not exercise so much that you hurt yourself, feel dizzy, or get very short of breath.  Before you start a new exercise program, talk with your health care provider. This information is not intended to replace advice given to you by your health care provider. Make sure you discuss any questions you have with your health care provider. Document Revised: 10/10/2017 Document Reviewed: 09/18/2017 Elsevier Patient Education  2020 [employer].

## 2020-01-05 LAB — LIPID PANEL
Cholesterol: 117 mg/dL (ref <200)
HDL: 31 mg/dL — ABNORMAL LOW (ref >=40)
LDL Cholesterol (Calc): 61 mg/dL (calc)
Non-HDL Cholesterol (Calc): 86 mg/dL (calc) (ref <130)
Total CHOL/HDL Ratio: 3.8 (calc) (ref <5.0)
Triglycerides: 168 mg/dL — ABNORMAL HIGH (ref <150)

## 2020-01-05 LAB — COMPLETE METABOLIC PANEL WITH GFR
AG Ratio: 1.6 (calc) (ref 1.0–2.5)
ALT: 14 U/L (ref 9–46)
AST: 15 U/L (ref 10–35)
Albumin: 4.1 g/dL (ref 3.6–5.1)
Alkaline phosphatase (APISO): 82 U/L (ref 35–144)
BUN: 25 mg/dL (ref 7–25)
CO2: 30 mmol/L (ref 20–32)
Calcium: 9.4 mg/dL (ref 8.6–10.3)
Chloride: 105 mmol/L (ref 98–110)
Creat: 1 mg/dL (ref 0.70–1.25)
GFR, Est African American: 90 mL/min/{1.73_m2} (ref >=60)
GFR, Est Non African American: 78 mL/min/{1.73_m2} (ref >=60)
Globulin: 2.5 g/dL (calc) (ref 1.9–3.7)
Glucose, Bld: 85 mg/dL (ref 65–99)
Potassium: 3.8 mmol/L (ref 3.5–5.3)
Sodium: 142 mmol/L (ref 135–146)
Total Bilirubin: 0.6 mg/dL (ref 0.2–1.2)
Total Protein: 6.6 g/dL (ref 6.1–8.1)

## 2020-01-05 LAB — TSH: TSH: 0.74 mIU/L (ref 0.40–4.50)

## 2020-01-05 LAB — CBC WITH DIFFERENTIAL/PLATELET
Absolute Monocytes: 737 cells/uL (ref 200–950)
Basophils Absolute: 38 cells/uL (ref 0–200)
Basophils Relative: 0.6 %
Eosinophils Absolute: 132 cells/uL (ref 15–500)
Eosinophils Relative: 2.1 %
HCT: 46.2 % (ref 38.5–50.0)
Hemoglobin: 16 g/dL (ref 13.2–17.1)
Lymphs Abs: 1260 cells/uL (ref 850–3900)
MCH: 33.5 pg — ABNORMAL HIGH (ref 27.0–33.0)
MCHC: 34.6 g/dL (ref 32.0–36.0)
MCV: 96.7 fL (ref 80.0–100.0)
MPV: 10.6 fL (ref 7.5–12.5)
Monocytes Relative: 11.7 %
Neutro Abs: 4133 cells/uL (ref 1500–7800)
Neutrophils Relative %: 65.6 %
Platelets: 246 10*3/uL (ref 140–400)
RBC: 4.78 10*6/uL (ref 4.20–5.80)
RDW: 12.1 % (ref 11.0–15.0)
Total Lymphocyte: 20 %
WBC: 6.3 10*3/uL (ref 3.8–10.8)

## 2020-01-05 LAB — MAGNESIUM: Magnesium: 2.2 mg/dL (ref 1.5–2.5)

## 2020-01-18 ENCOUNTER — Ambulatory Visit: Payer: Medicare Other | Admitting: Adult Health

## 2020-03-28 NOTE — Progress Notes (Signed)
Assessment and Plan:  Steven Bray was seen today for neck pain.  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 Somewhat flattened R nasolabial fold (? R/t remote CVA, vs new vs post viral bells palsy),  in high risk CVA hx of htn, hyperlipidemia, hx of CVA only on bASA, current smoker ? Some struggle with finger to nose, unsteady gait though otherwise normal neuro, neg pronator drift, heel to shin, rapid alternating After discussion will pursue CT head without contrast, consider adding plavix, consider MRI vs neuro referral, ordered STAT  Will go to the ER if new headache, changes vision/speech, imbalance, weakness. -     CBC with Differential/Platelet -     COMPLETE METABOLIC PANEL WITH GFR -     Urinalysis, Routine w reflex microscopic -     Sedimentation rate -     C-reactive protein -     CT Head Wo Contrast; Future  Neck pain Neck exam appears quite benign, suggestive of simple muscular etiology Onset conincides with other concerning neuro sx per above, unclear whether or how this is related  Continue ASA, meloxicam, try heat, massage, gentle ROM and stretching Follow up if worsening, persistent or new concerning changes  Further disposition pending results of labs. Discussed med's effects and SE's.   Over 30 minutes of exam, counseling, chart review, and critical decision making was performed.   Future Appointments  Date Time Provider [place]  04/11/2020 11:30 AM Unk Pinto, MD [facility] None  10/26/2020  3:00 PM Unk Pinto, MD [facility] None    ------------------------------------------------------------------------------------------------------------------   HPI BP 120/78   Pulse 61   Temp (!) 97.5 F (36.4 C)   Ht 5'  10.5" (1.791 m)   Wt 211 lb (95.7 kg)   SpO2 97%   BMI 29.85 kg/m   68 y.o.male with hx of htn, hyperlipidemia, smoker with hx of TIA and CVA (per patient remote, 2002, "pons and R hemisphere") presents for evaluation of neck pain, dizziness, mental fog x 1 week.   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 He reports neck pain that began end of last week, 5/12 or 13th, woke up one morning with generalized neck pain, midline and also stiffness and tenderness to musculature, describes pain "like stiff muscles" "tight" -denies sharp, catching, aching. Denies radicular pain to shoulders or down upper extremities, denies numbness, tingling or weakness of extremities. Has been rubbing neck, using TENS unit which some benefit. Hasn't tried tylenol or NSAID, took an extra aspirin but couldn't tell if this helped. He takes meloxicam 15 mg daily for general aches and pains.   He also reports the same morning as onset of neck pain experienced a short period of numbness in his upper lip, at the center, "just numb" - reports resolved after about a minute spontaneously. Denies having headache, vision changes, but also has noted over the last week feeling somewhat dizzy (hx of vertigo, this feels different, not room spinning, feels off balance all the time), mental fog and slow, a bit disoriented.   Hx of CVA in 2002, only on bASA, BP and cholesterol well controlled (atorvasatin 40 mg) but continues to smoke.   His blood pressure has been controlled at home, today their BP is BP: 120/78  He denies chest pain, shortness of breath, dizziness.   He is on cholesterol medication and denies myalgias. His cholesterol is at goal. The cholesterol last visit was:   Lab Results  Component Value Date   CHOL 117 01/04/2020   HDL 31 (L) 01/04/2020   LDLCALC 61 01/04/2020   TRIG 168 (H) 01/04/2020   CHOLHDL 3.8 01/04/2020    Past Medical History:   Diagnosis Date  . ED (erectile dysfunction)   . GERD (gastroesophageal reflux disease)   . Hyperlipidemia   . Hypertension   . Pre-diabetes   . Stroke ([place])   . TIA (transient ischemic attack)   . Vitamin D deficiency      Allergies  Allergen Reactions  . Sulfa Antibiotics Nausea Only    Current Outpatient Medications on File Prior to Visit  Medication Sig  . aspirin 81 MG tablet Take 81 mg by mouth daily.  Marland Kitchen atenolol (TENORMIN) 50 MG tablet TAKE 1 TABLET BY MOUTH EVERY DAY (Patient taking differently: Take 25mg  in the morning and 25mg  in the evening)  . atorvastatin (LIPITOR) 40 MG tablet Take 1 tablet 3 days  /week on Mon Wed & Fri for Cholesterol  . Cyanocobalamin (VITAMIN B 12) 500 MCG TABS Take 1 tablet by mouth daily.  . hydrochlorothiazide (MICROZIDE) 12.5 MG capsule TAKE 1 CAPSULE DAILY FOR BP & FLUID  . meloxicam (MOBIC) 15 MG tablet Take 1/2 to 1 tablet Daily with Food for Pain & Inflammation  . pantoprazole (PROTONIX) 40 MG tablet Take 1 tablet Daily for Indigestion & Heartburn  . tadalafil (CIALIS) 20 MG tablet Take 1/2 to 1 tablet every 2 to 3 days as needed for XXXX  . tamsulosin (FLOMAX) 0.4 MG CAPS capsule Take 1 capsule Daily for Prostate  . Vitamin D, Ergocalciferol, (DRISDOL) 50000 units CAPS capsule TAKE 1 CAPSULE BY MOUTH EVERY DAY (Patient taking differently: Take 10,000 units Monday, Wednesday and Friday)   No current facility-administered medications on file prior to visit.    ROS: all negative except above.   Physical Exam:  BP 120/78   Pulse 61   Temp (!) 97.5 F (36.4 C)   Ht 5' 10.5" (1.791 m)   Wt 211 lb (95.7 kg)   SpO2 97%   BMI 29.85 kg/m   General Appearance: Well nourished, well dressed male in no apparent distress. Eyes: PERRLA, EOMs, conjunctiva no swelling or erythema Sinuses: No Frontal/maxillary tenderness ENT/Mouth: Ext aud canals clear, TMs without erythema, bulging. No erythema, swelling, or exudate on post pharynx.  Tonsils  not swollen or erythematous. Hearing normal.  Neck: thyroid normal.  Respiratory: Respiratory effort normal, BS equal bilaterally without rales, rhonchi, wheezing or stridor.  Cardio: RRR with no MRGs. Intact symmetrical peripheral pulses without edema. No carotid bruits, dizziness not worse with neck extension.  Abdomen: Soft, + BS.  Non tender, no guarding, rebound, hernias, masses. Lymphatics: Non tender without lymphadenopathy.  Musculoskeletal: Full ROM, 5/5 strength, normal gait. Full ROM neck, paraspinal muscular tenderness with palpation, no spasm,  No midline tenderness. Skin: Warm, dry without rashes, lesions, ecchymosis.  Neuro: Cranial nerves intact except R nasolabial fold and upper lip are notably less mobile/flat compared to L; Normal muscle tone, Sensation intact. No pronator drift. Mildly unstable gait, slow finger to nose, intact heel to shin, rapid alternating movements.  Psych: Awake and oriented X 3, normal affect, Insight and Judgment is fair    Izora Ribas, NP 10:39 AM [facility]

## 2020-03-29 ENCOUNTER — Encounter: Payer: Self-pay | Admitting: Adult Health

## 2020-03-29 ENCOUNTER — Other Ambulatory Visit: Payer: Self-pay | Admitting: Adult Health

## 2020-03-29 ENCOUNTER — Ambulatory Visit
Admission: RE | Admit: 2020-03-29 | Discharge: 2020-03-29 | Disposition: A | Discharge status: Home / Self Care | Payer: Medicare Other | Source: Ambulatory Visit | Attending: Adult Health | Admitting: Adult Health

## 2020-03-29 ENCOUNTER — Ambulatory Visit (INDEPENDENT_AMBULATORY_CARE_PROVIDER_SITE_OTHER): Payer: Medicare Other | Admitting: Adult Health

## 2020-03-29 ENCOUNTER — Other Ambulatory Visit: Payer: Self-pay

## 2020-03-29 VITALS — BP 120/78 | HR 61 | Temp 97.5°F | Ht 70.5 in | Wt 211.0 lb

## 2020-03-29 DIAGNOSIS — M542 Cervicalgia: Secondary | ICD-10-CM

## 2020-03-29 DIAGNOSIS — R42 Dizziness and giddiness: Secondary | ICD-10-CM | POA: Diagnosis not present

## 2020-03-29 DIAGNOSIS — R29818 Other symptoms and signs involving the nervous system: Secondary | ICD-10-CM

## 2020-03-29 DIAGNOSIS — Z8673 Personal history of transient ischemic attack (TIA), and cerebral infarction without residual deficits: Secondary | ICD-10-CM

## 2020-03-29 DIAGNOSIS — I10 Essential (primary) hypertension: Secondary | ICD-10-CM

## 2020-03-29 DIAGNOSIS — R29810 Facial weakness: Secondary | ICD-10-CM

## 2020-03-29 DIAGNOSIS — R4189 Other symptoms and signs involving cognitive functions and awareness: Secondary | ICD-10-CM

## 2020-03-29 DIAGNOSIS — R413 Other amnesia: Secondary | ICD-10-CM | POA: Diagnosis not present

## 2020-03-29 DIAGNOSIS — Z720 Tobacco use: Secondary | ICD-10-CM

## 2020-03-29 MED ORDER — CLOPIDOGREL BISULFATE 75 MG PO TABS
75.0000 mg | ORAL_TABLET | Freq: Every day | ORAL | 1 refills | Status: DC
Start: 2020-03-29 — End: 2020-04-11

## 2020-03-30 LAB — COMPLETE METABOLIC PANEL WITH GFR
AG Ratio: 1.7 (calc) (ref 1.0–2.5)
ALT: 12 U/L (ref 9–46)
AST: 15 U/L (ref 10–35)
Albumin: 4 g/dL (ref 3.6–5.1)
Alkaline phosphatase (APISO): 80 U/L (ref 35–144)
BUN: 18 mg/dL (ref 7–25)
CO2: 32 mmol/L (ref 20–32)
Calcium: 9.7 mg/dL (ref 8.6–10.3)
Chloride: 103 mmol/L (ref 98–110)
Creat: 1.15 mg/dL (ref 0.70–1.25)
GFR, Est African American: 76 mL/min/{1.73_m2} (ref >=60)
GFR, Est Non African American: 66 mL/min/{1.73_m2} (ref >=60)
Globulin: 2.4 g/dL (calc) (ref 1.9–3.7)
Glucose, Bld: 97 mg/dL (ref 65–99)
Potassium: 3.9 mmol/L (ref 3.5–5.3)
Sodium: 140 mmol/L (ref 135–146)
Total Bilirubin: 0.7 mg/dL (ref 0.2–1.2)
Total Protein: 6.4 g/dL (ref 6.1–8.1)

## 2020-03-30 LAB — URINALYSIS, ROUTINE W REFLEX MICROSCOPIC
Bilirubin Urine: NEGATIVE
Glucose, UA: NEGATIVE
Hgb urine dipstick: NEGATIVE
Ketones, ur: NEGATIVE
Leukocytes,Ua: NEGATIVE
Nitrite: NEGATIVE
Protein, ur: NEGATIVE
Specific Gravity, Urine: 1.022 (ref 1.001–1.03)
pH: <=5 (ref 5.0–8.0)

## 2020-03-30 LAB — CBC WITH DIFFERENTIAL/PLATELET
Absolute Monocytes: 621 cells/uL (ref 200–950)
Basophils Absolute: 38 cells/uL (ref 0–200)
Basophils Relative: 0.7 %
Eosinophils Absolute: 351 cells/uL (ref 15–500)
Eosinophils Relative: 6.5 %
HCT: 47.4 % (ref 38.5–50.0)
Hemoglobin: 15.9 g/dL (ref 13.2–17.1)
Lymphs Abs: 994 cells/uL (ref 850–3900)
MCH: 32.6 pg (ref 27.0–33.0)
MCHC: 33.5 g/dL (ref 32.0–36.0)
MCV: 97.1 fL (ref 80.0–100.0)
MPV: 10.5 fL (ref 7.5–12.5)
Monocytes Relative: 11.5 %
Neutro Abs: 3397 cells/uL (ref 1500–7800)
Neutrophils Relative %: 62.9 %
Platelets: 216 10*3/uL (ref 140–400)
RBC: 4.88 10*6/uL (ref 4.20–5.80)
RDW: 12.2 % (ref 11.0–15.0)
Total Lymphocyte: 18.4 %
WBC: 5.4 10*3/uL (ref 3.8–10.8)

## 2020-03-30 LAB — C-REACTIVE PROTEIN: CRP: 5.7 mg/L (ref <8.0)

## 2020-03-30 LAB — SEDIMENTATION RATE: Sed Rate: 14 mm/h (ref 0–20)

## 2020-04-10 ENCOUNTER — Encounter: Payer: Self-pay | Admitting: Internal Medicine

## 2020-04-10 NOTE — Progress Notes (Signed)
History of Present Illness:       This very nice 69 y.o. DWM presents for 6 month follow up with HTN, HLD, Pre-Diabetes and Vitamin D Deficiency.  Patient has GERD controlled on his meds.      Patient is treated for HTN since 2002 when he initially presented with a CVA / TIA with LUE weakness which resolved.   Recently, about 10 days ago , patient had some vague sx's and Head CT scan was negative. Patient was recommended  Plavix which he never started and remains on LD-bASA 81 mg  3 x /week. Neuro consult was requested, but patient never followed through on appointment.  BP has been controlled at home. Today's BP is at goal - 116/80. Patient has had no complaints of any cardiac type chest pain, palpitations, dyspnea / orthopnea / PND, dizziness, claudication, or dependent edema.      Hyperlipidemia is controlled with diet & Atorvastatin. Patient denies myalgias or other med SE's. Last Lipids were   Lab Results  Component Value Date   CHOL 117 01/04/2020   HDL 31 (L) 01/04/2020   LDLCALC 61 01/04/2020   TRIG 168 (H) 01/04/2020   CHOLHDL 3.8 01/04/2020    Also, the patient has history of PreDiabetes (A1c 5.7% / 2013)  and has had no symptoms of reactive hypoglycemia, diabetic polys, paresthesias or visual blurring.  Last A1c was Normal & at goal:  Lab Results  Component Value Date   HGBA1C 5.4 09/28/2019       Further, the patient also has history of Vitamin D Deficiency ("36" / 2008)  and supplements vitamin D without any suspected side-effects. Last vitamin D was elevated and dose was decreased from daily 50,000 to 10,000 units daily:  Lab Results  Component Value Date   VD25OH 134 (H) 09/28/2019    Current Outpatient Medications on File Prior to Visit  Medication Sig  . aspirin EC 81 MG tablet Take 81 mg by mouth daily.  Marland Kitchen atenolol (TENORMIN) 50 MG tablet TAKE 1 TABLET BY MOUTH EVERY DAY (Patient taking differently: Take 25mg  in the morning and 25mg  in the evening)  .  atorvastatin (LIPITOR) 40 MG tablet Take 1 tablet 3 days  /week on Mon Wed & Fri for Cholesterol  . Cyanocobalamin (VITAMIN B 12) 500 MCG TABS Take 1 tablet by mouth daily.  . hydrochlorothiazide (MICROZIDE) 12.5 MG capsule TAKE 1 CAPSULE DAILY FOR BP & FLUID  . meloxicam (MOBIC) 15 MG tablet Take 1/2 to 1 tablet Daily with Food for Pain & Inflammation  . pantoprazole (PROTONIX) 40 MG tablet Take 1 tablet Daily for Indigestion & Heartburn  . tadalafil (CIALIS) 20 MG tablet Take 1/2 to 1 tablet every 2 to 3 days as needed for XXXX  . tamsulosin (FLOMAX) 0.4 MG CAPS capsule Take 1 capsule Daily for Prostate  . VITAMIN D PO Take 10,000 Units by mouth daily.   No current facility-administered medications on file prior to visit.    Allergies  Allergen Reactions  . Sulfa Antibiotics Nausea Only    PMHx:   Past Medical History:  Diagnosis Date  . ED (erectile dysfunction)   . GERD (gastroesophageal reflux disease)   . Hyperlipidemia   . Hypertension   . Pre-diabetes   . Stroke ([place])   . TIA (transient ischemic attack)   . Vitamin D deficiency     Immunization History  Administered Date(s) Administered  . PPD Test 12/13/2013, 12/14/2014, 01/11/2016, 02/28/2017  .  Pneumococcal Conjugate-13 08/17/2018  . Pneumococcal Polysaccharide-23 09/28/2019  . Pneumococcal-Unspecified 11/24/2008  . Td 11/11/2004  . Tdap 12/14/2014    Past Surgical History:  Procedure Laterality Date  . HERNIA REPAIR Right    INGUINAL    FHx:    Reviewed / unchanged  SHx:    Reviewed / unchanged   Systems Review:  Constitutional: Denies fever, chills, wt changes, headaches, insomnia, fatigue, night sweats, change in appetite. Eyes: Denies redness, blurred vision, diplopia, discharge, itchy, watery eyes.  ENT: Denies discharge, congestion, post nasal drip, epistaxis, sore throat, earache, hearing loss, dental pain, tinnitus, vertigo, sinus pain, snoring.  CV: Denies chest pain, palpitations, irregular  heartbeat, syncope, dyspnea, diaphoresis, orthopnea, PND, claudication or edema. Respiratory: denies cough, dyspnea, DOE, pleurisy, hoarseness, laryngitis, wheezing.  Gastrointestinal: Denies dysphagia, odynophagia, heartburn, reflux, water brash, abdominal pain or cramps, nausea, vomiting, bloating, diarrhea, constipation, hematemesis, melena, hematochezia  or hemorrhoids. Genitourinary: Denies dysuria, frequency, urgency, nocturia, hesitancy, discharge, hematuria or flank pain. Musculoskeletal: Denies arthralgias, myalgias, stiffness, jt. swelling, pain, limping or strain/sprain.  Skin: Denies pruritus, rash, hives, warts, acne, eczema or change in skin lesion(s). Neuro: No weakness, tremor, incoordination, spasms, paresthesia or pain. Psychiatric: Denies confusion, memory loss or sensory loss. Endo: Denies change in weight, skin or hair change.  Heme/Lymph: No excessive bleeding, bruising or enlarged lymph nodes.  Physical Exam  BP 116/80   Pulse 64   Temp (!) 97 F (36.1 C)   Resp 16   Ht 5' 10.5" (1.791 m)   Wt 209 lb 9.6 oz (95.1 kg)   BMI 29.65 kg/m   Appears  well nourished, well groomed  and in no distress.  Eyes: PERRLA, EOMs, conjunctiva no swelling or erythema. Sinuses: No frontal/maxillary tenderness ENT/Mouth: EAC's clear, TM's nl w/o erythema, bulging. Nares clear w/o erythema, swelling, exudates. Oropharynx clear without erythema or exudates. Oral hygiene is good. Tongue normal, non obstructing. Hearing intact.  Neck: Supple. Thyroid not palpable. Car 2+/2+ without bruits, nodes or JVD. Chest: Respirations nl with BS clear & equal w/o rales, rhonchi, wheezing or stridor.  Cor: Heart sounds normal w/ regular rate and rhythm without sig. murmurs, gallops, clicks or rubs. Peripheral pulses normal and equal  without edema.  Abdomen: Soft & bowel sounds normal. Non-tender w/o guarding, rebound, hernias, masses or organomegaly.  Lymphatics: Unremarkable.  Musculoskeletal:  Full ROM all peripheral extremities, joint stability, 5/5 strength and normal gait.  Skin: Warm, dry without exposed rashes, lesions or ecchymosis apparent.  Neuro: Cranial nerves intact, reflexes equal bilaterally. Sensory-motor testing grossly intact. Tendon reflexes grossly intact.  Pysch: Alert & oriented x 3.  Insight and judgement nl & appropriate. No ideations.  Assessment and Plan:  - Continue medication, monitor blood pressure at home.  - Continue DASH diet.  Reminder to go to the ER if any CP,  SOB, nausea, dizziness, severe HA, changes vision/speech.  1. Essential hypertension  - CBC with Differential/Platelet - Magnesium - COMPLETE METABOLIC PANEL WITH GFR - TSH  2. Hyperlipidemia, mixed  - Continue diet/meds, exercise,& lifestyle modifications.  - Continue monitor periodic cholesterol/liver & renal functions   - Lipid panel - TSH  3. Abnormal glucose  - Continue diet, exercise  - Lifestyle modifications.  - Monitor appropriate labs.  - Hemoglobin A1c - Insulin, random  4. Vitamin D deficiency  - Continue supplementation.  - VITAMIN D 25 Hydroxy  5. Gastroesophageal reflux disease  - CBC with Differential/Platelet  6. Medication management  - CBC with Differential/Platelet - Magnesium -  COMPLETE METABOLIC PANEL WITH GFR - Lipid panel - TSH - Hemoglobin A1c - Insulin, random - VITAMIN D 25 Hydroxy         Discussed  regular exercise, BP monitoring, weight control to achieve/maintain BMI less than 25 and discussed med and SE's. Recommended labs to assess and monitor clinical status with further disposition pending results of labs.  I discussed the assessment and treatment plan with the patient. The patient was provided an opportunity to ask questions and all were answered. The patient agreed with the plan and demonstrated an understanding of the instructions.  I provided over 30 minutes of exam, counseling, chart review and  complex critical decision  making.         The patient was advised to call back or seek an in-person evaluation if the symptoms worsen or if the condition fails to improve as anticipated.   Kirtland Bouchard, MD

## 2020-04-10 NOTE — Patient Instructions (Signed)

## 2020-04-11 ENCOUNTER — Ambulatory Visit (INDEPENDENT_AMBULATORY_CARE_PROVIDER_SITE_OTHER): Payer: Medicare Other | Admitting: Internal Medicine

## 2020-04-11 ENCOUNTER — Other Ambulatory Visit: Payer: Self-pay

## 2020-04-11 VITALS — BP 116/80 | HR 64 | Temp 97.0°F | Resp 16 | Ht 70.5 in | Wt 209.6 lb

## 2020-04-11 DIAGNOSIS — E782 Mixed hyperlipidemia: Secondary | ICD-10-CM | POA: Diagnosis not present

## 2020-04-11 DIAGNOSIS — R7309 Other abnormal glucose: Secondary | ICD-10-CM

## 2020-04-11 DIAGNOSIS — K219 Gastro-esophageal reflux disease without esophagitis: Secondary | ICD-10-CM

## 2020-04-11 DIAGNOSIS — E559 Vitamin D deficiency, unspecified: Secondary | ICD-10-CM | POA: Diagnosis not present

## 2020-04-11 DIAGNOSIS — I10 Essential (primary) hypertension: Secondary | ICD-10-CM

## 2020-04-11 DIAGNOSIS — Z79899 Other long term (current) drug therapy: Secondary | ICD-10-CM

## 2020-04-12 ENCOUNTER — Telehealth: Payer: Self-pay | Admitting: *Deleted

## 2020-04-12 LAB — COMPLETE METABOLIC PANEL WITH GFR
AG Ratio: 1.8 (calc) (ref 1.0–2.5)
ALT: 16 U/L (ref 9–46)
AST: 19 U/L (ref 10–35)
Albumin: 4.2 g/dL (ref 3.6–5.1)
Alkaline phosphatase (APISO): 76 U/L (ref 35–144)
BUN: 21 mg/dL (ref 7–25)
CO2: 28 mmol/L (ref 20–32)
Calcium: 9.3 mg/dL (ref 8.6–10.3)
Chloride: 105 mmol/L (ref 98–110)
Creat: 0.98 mg/dL (ref 0.70–1.25)
GFR, Est African American: 93 mL/min/{1.73_m2} (ref >=60)
GFR, Est Non African American: 80 mL/min/{1.73_m2} (ref >=60)
Globulin: 2.4 g/dL (calc) (ref 1.9–3.7)
Glucose, Bld: 89 mg/dL (ref 65–99)
Potassium: 4 mmol/L (ref 3.5–5.3)
Sodium: 142 mmol/L (ref 135–146)
Total Bilirubin: 0.7 mg/dL (ref 0.2–1.2)
Total Protein: 6.6 g/dL (ref 6.1–8.1)

## 2020-04-12 LAB — HEMOGLOBIN A1C
Hgb A1c MFr Bld: 5.2 % of total Hgb (ref <5.7)
Mean Plasma Glucose: 103 (calc)
eAG (mmol/L): 5.7 (calc)

## 2020-04-12 LAB — CBC WITH DIFFERENTIAL/PLATELET
Absolute Monocytes: 635 cells/uL (ref 200–950)
Basophils Absolute: 50 cells/uL (ref 0–200)
Basophils Relative: 1 %
Eosinophils Absolute: 330 cells/uL (ref 15–500)
Eosinophils Relative: 6.6 %
HCT: 46.4 % (ref 38.5–50.0)
Hemoglobin: 15.6 g/dL (ref 13.2–17.1)
Lymphs Abs: 1065 cells/uL (ref 850–3900)
MCH: 32.2 pg (ref 27.0–33.0)
MCHC: 33.6 g/dL (ref 32.0–36.0)
MCV: 95.7 fL (ref 80.0–100.0)
MPV: 10.8 fL (ref 7.5–12.5)
Monocytes Relative: 12.7 %
Neutro Abs: 2920 cells/uL (ref 1500–7800)
Neutrophils Relative %: 58.4 %
Platelets: 218 10*3/uL (ref 140–400)
RBC: 4.85 10*6/uL (ref 4.20–5.80)
RDW: 12.2 % (ref 11.0–15.0)
Total Lymphocyte: 21.3 %
WBC: 5 10*3/uL (ref 3.8–10.8)

## 2020-04-12 LAB — INSULIN, RANDOM: Insulin: 9.6 u[IU]/mL

## 2020-04-12 LAB — MAGNESIUM: Magnesium: 1.9 mg/dL (ref 1.5–2.5)

## 2020-04-12 LAB — LIPID PANEL
Cholesterol: 119 mg/dL (ref <200)
HDL: 35 mg/dL — ABNORMAL LOW (ref >=40)
LDL Cholesterol (Calc): 64 mg/dL (calc)
Non-HDL Cholesterol (Calc): 84 mg/dL (calc) (ref <130)
Total CHOL/HDL Ratio: 3.4 (calc) (ref <5.0)
Triglycerides: 115 mg/dL (ref <150)

## 2020-04-12 LAB — VITAMIN D 25 HYDROXY (VIT D DEFICIENCY, FRACTURES): Vit D, 25-Hydroxy: 102 ng/mL — ABNORMAL HIGH (ref 30–100)

## 2020-04-12 LAB — TSH: TSH: 0.97 mIU/L (ref 0.40–4.50)

## 2020-04-12 NOTE — Telephone Encounter (Signed)
Patient declined to start Plavix, due to side effects. Per Dr Melford Aase, increase ASA 81 mg to 2 tablets daily. Patient is aware.

## 2020-04-15 ENCOUNTER — Other Ambulatory Visit: Payer: Self-pay | Admitting: Internal Medicine

## 2020-05-26 ENCOUNTER — Other Ambulatory Visit: Payer: Self-pay | Admitting: Internal Medicine

## 2020-05-26 DIAGNOSIS — I10 Essential (primary) hypertension: Secondary | ICD-10-CM

## 2020-05-29 ENCOUNTER — Encounter: Payer: Medicare Other | Admitting: Internal Medicine

## 2020-05-31 ENCOUNTER — Other Ambulatory Visit: Payer: Self-pay | Admitting: Internal Medicine

## 2020-05-31 DIAGNOSIS — I10 Essential (primary) hypertension: Secondary | ICD-10-CM

## 2020-05-31 MED ORDER — ATENOLOL 25 MG PO TABS: ORAL_TABLET | ORAL | 1 refills | Status: DC

## 2020-07-11 NOTE — Progress Notes (Signed)
Assessment and Plan:   Essential hypertension Continue medication Monitor blood pressure at home; call if consistently over 130/80 Continue DASH diet.   Reminder to go to the ER if any CP, SOB, nausea, dizziness, severe HA, changes vision/speech, left arm numbness and tingling and jaw pain.  Gastroesophageal reflux disease, esophagitis presence not specified Well managed on current medications Discussed diet, avoiding triggers and other lifestyle changes  Vitamin D deficiency At goal at recent check; continue to recommend supplementation for goal of 70-100- was elevated last visit- will recheck vitamin D level  Tobacco abuse Discussed risks associated with tobacco use and advised to reduce or quit Patient is actively cutting back, declines medications to assist Will follow up at the next visit   Other abnormal glucose Recent A1Cs at goal Discussed diet/exercise, weight management  Defer A1C; check CMP  Medication management CBC, CMP/GFR  Mixed hyperlipidemia Continue medications Continue low cholesterol diet and exercise.  Check lipid panel.   Overweight (BMI 25.0-29.9) Long discussion about weight loss, diet, and exercise Recommended diet heavy in fruits and veggies and low in animal meats, cheeses, and dairy products, appropriate calorie intake Discussed appropriate weight for height  Follow up at next visit   Over 30 minutes of exam, counseling, chart review, and critical decision making was performed  Future Appointments  Date Time Provider [place]  10/26/2020  3:00 PM Unk Pinto, MD [facility] None    -------------------------------------------------------------------------------------------------------------------------------------  Steven Laurence. is a 69 y.o. male who presents for  3 month follow up for HTN, hyperlipidemia, glucose management, and vitamin D Def.   he currently continues to smoke 0.5-1 pack a day, actively trying to cut  back; discussed risks associated with smoking, patient is ready to quit. Declines medications.   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 He would like to get between 180--190. We have discussed first goal is 10 lbs.  Wt Readings from Last 3 Encounters:  07/13/20 208 lb (94.3 kg)  04/11/20 209 lb 9.6 oz (95.1 kg)  03/29/20 211 lb (95.7 kg)   His blood pressure has been controlled at home, today their BP is BP: 130/78 He does not workout. He denies chest pain, shortness of breath, dizziness.   He is on cholesterol medication (atorvastatin 40 mg MWF) and denies myalgias. His cholesterol is at goal. The cholesterol last visit was:   Lab Results  Component Value Date   CHOL 119 04/11/2020   HDL 35 (L) 04/11/2020   LDLCALC 64 04/11/2020   TRIG 115 04/11/2020   CHOLHDL 3.4 04/11/2020   He has not been working on diet and exercise for glucose management, and denies foot ulcerations, increased appetite, nausea, paresthesia of the feet, polydipsia, polyuria, visual disturbances, vomiting and weight loss. Last A1C in the office was:  Lab Results  Component Value Date   HGBA1C 5.2 04/11/2020   Last GFR Lab Results  Component Value Date   GFRNONAA 80 04/11/2020   Patient is on Vitamin D supplement.   Lab Results  Component Value Date   VD25OH 102 (H) 04/11/2020       Medication Review:   Current Outpatient Medications (Cardiovascular):  .  atenolol (TENORMIN) 25 MG tablet, Take 1 tablet    2 x /day   for BP .  atorvastatin (LIPITOR) 40 MG tablet, Take 1 tablet 3 days  /week on Mon Wed & Fri for  Cholesterol .  hydrochlorothiazide (MICROZIDE) 12.5 MG capsule, Take 1 capsule daily for BP & Fluid Retention / Ankle Swelling .  tadalafil (CIALIS) 20 MG tablet, Take 1/2 to 1 tablet every 2 to 3 days as needed for XXXX   Current Outpatient Medications (Analgesics):  .  aspirin EC 81 MG tablet, Take 81 mg by mouth  daily. .  meloxicam (MOBIC) 15 MG tablet, Take 1/2 to 1 tablet Daily with Food for Pain & Inflammation  Current Outpatient Medications (Hematological):  Marland Kitchen  Cyanocobalamin (VITAMIN B 12) 500 MCG TABS, Take 1 tablet by mouth daily.  Current Outpatient Medications (Other):  .  pantoprazole (PROTONIX) 40 MG tablet, Take 1 tablet Daily for Indigestion & Heartburn .  tamsulosin (FLOMAX) 0.4 MG CAPS capsule, Take 1 capsule Daily for Prostate .  VITAMIN D PO, Take 10,000 Units by mouth daily.  Allergies: Allergies  Allergen Reactions  . Sulfa Antibiotics Nausea Only    Current Problems (verified) has Hypertension; Hyperlipidemia; Other abnormal glucose; GERD (gastroesophageal reflux disease); Vitamin D deficiency; Medication management; Tobacco abuse; Overweight (BMI 25.0-29.9); BPH associated with nocturia; Positive colorectal cancer screening using Cologuard test; and History of cardioembolic cerebrovascular accident (CVA) on their problem list.   Review of Systems  Constitutional: Negative for malaise/fatigue and weight loss.  HENT: Negative for hearing loss and tinnitus.   Eyes: Negative for blurred vision and double vision.  Respiratory: Negative for cough, shortness of breath and wheezing.   Cardiovascular: Negative for chest pain, palpitations, orthopnea, claudication and leg swelling.  Gastrointestinal: Negative for abdominal pain, blood in stool, constipation, diarrhea, heartburn, melena, nausea and vomiting.  Genitourinary:       Nocturia  Musculoskeletal: Negative for joint pain and myalgias.  Skin: Negative for rash.  Neurological: Negative for dizziness, tingling, sensory change, weakness and headaches.  Endo/Heme/Allergies: Negative for polydipsia.  Psychiatric/Behavioral: Negative.   All other systems reviewed and are negative.    Today's Vitals   07/13/20 1140  BP: 130/78  Pulse: 60  Temp: (!) 97.5 F (36.4 C)  SpO2: 98%  Weight: 208 lb (94.3 kg)   Body mass  index is 29.42 kg/m.  General appearance: alert, no distress, WD/WN, male HEENT: normocephalic, sclerae anicteric, TMs pearly, nares patent, no discharge or erythema, pharynx normal Oral cavity: MMM, no lesions Neck: supple, no lymphadenopathy, no thyromegaly, no masses Heart: RRR, normal S1, S2, no murmurs Lungs: CTA bilaterally, no wheezes, rhonchi, or rales Abdomen: +bs, soft, non tender, non distended, no masses, no hepatomegaly, no splenomegaly Musculoskeletal: nontender, no swelling, no obvious deformity Extremities: no edema, no cyanosis, no clubbing Pulses: 2+ symmetric, upper and lower extremities, normal cap refill Neurological: alert, oriented x 3, CN2-12 intact, strength normal upper extremities and lower extremities, sensation normal throughout, DTRs 2+ throughout, no cerebellar signs, gait normal Psychiatric: normal affect, behavior normal, pleasant      Steven Mutters, PA-C   07/13/2020

## 2020-07-13 ENCOUNTER — Encounter: Payer: Self-pay | Admitting: Physician Assistant

## 2020-07-13 ENCOUNTER — Ambulatory Visit (INDEPENDENT_AMBULATORY_CARE_PROVIDER_SITE_OTHER): Payer: Medicare Other | Admitting: Physician Assistant

## 2020-07-13 ENCOUNTER — Other Ambulatory Visit: Payer: Self-pay

## 2020-07-13 VITALS — BP 130/78 | HR 60 | Temp 97.5°F | Wt 208.0 lb

## 2020-07-13 DIAGNOSIS — R7309 Other abnormal glucose: Secondary | ICD-10-CM

## 2020-07-13 DIAGNOSIS — E559 Vitamin D deficiency, unspecified: Secondary | ICD-10-CM | POA: Diagnosis not present

## 2020-07-13 DIAGNOSIS — Z79899 Other long term (current) drug therapy: Secondary | ICD-10-CM

## 2020-07-13 DIAGNOSIS — I10 Essential (primary) hypertension: Secondary | ICD-10-CM

## 2020-07-13 DIAGNOSIS — D692 Other nonthrombocytopenic purpura: Secondary | ICD-10-CM | POA: Insufficient documentation

## 2020-07-13 DIAGNOSIS — E782 Mixed hyperlipidemia: Secondary | ICD-10-CM

## 2020-07-13 DIAGNOSIS — Z720 Tobacco use: Secondary | ICD-10-CM

## 2020-07-13 NOTE — Patient Instructions (Addendum)
Going to refer for colonoscopy- Phone: [phone];  Colon cancer is 3rd most diagnosed cancer and 2nd leading cause of death in both men and women 67 years of age and older despite being one of the most preventable and treatable cancers if found early.  4 of out 5 people diagnosed with colon cancer have NO prior family history.  When caught EARLY 90% of colon cancer is curable.  Use a dropper or use a cap to put peroxide, olive oil,mineral oil or canola oil in the effected ear- 2-3 times a week. Let it soak for 20-30 min then you can take a shower or use a baby bulb with warm water to wash out the ear wax.  Can buy debrox wax removal kit over the counter.  Do not use Qtips  CAN DO THE DEBROX OR THE WAX RX     Bad carbs also include fruit juice, alcohol, and sweet tea. These are empty calories that do not signal to your brain that you are full.   Please remember the good carbs are still carbs which convert into sugar. So please measure them out no more than 1/2-1 cup of rice, oatmeal, pasta, and beans  Veggies are however free foods! Pile them on.   Not all fruit is created equal. Please see the list below, the fruit at the bottom is higher in sugars than the fruit at the top. Please avoid all dried fruits.      General eating tips  What to Avoid . Avoid added sugars o Often added sugar can be found in processed foods such as many condiments, dry cereals, cakes, cookies, chips, crisps, crackers, candies, sweetened drinks, etc.  o Read labels and AVOID/DECREASE use of foods with the following in their ingredient list: Sugar, fructose, high fructose corn syrup, sucrose, glucose, maltose, dextrose, molasses, cane sugar, brown sugar, any type of syrup, agave nectar, etc.   . Avoid snacking in between meals- drink water or if you feel you need a snack, pick a high water content snack such as cucumbers, watermelon, or any veggie.  Marland Kitchen Avoid foods made with flour o If you are going to eat  food made with flour, choose those made with whole-grains; and, minimize your consumption as much as is tolerable . Avoid processed foods o These foods are generally stocked in the middle of the grocery store.  o Focus on shopping on the perimeter of the grocery.  What to Include . Vegetables o GREEN LEAFY VEGETABLES: Kale, spinach, mustard greens, collard greens, cabbage, broccoli, etc. o OTHER: Asparagus, cauliflower, eggplant, carrots, peas, Brussel sprouts, tomatoes, bell peppers, zucchini, beets, cucumbers, etc. . Grains, seeds, and legumes o Beans: kidney beans, black eyed peas, garbanzo beans, black beans, pinto beans, etc. o Whole, unrefined grains: brown rice, barley, bulgur, oatmeal, etc. . Healthy fats  o Avoid highly processed fats such as vegetable oil o Examples of healthy fats: avocado, olives, virgin olive oil, dark chocolate (?72% Cocoa), nuts (peanuts, almonds, walnuts, cashews, pecans, etc.) o Please still do small amount of these healthy fats, they are dense in calories.  . Low - Moderate Intake of Animal Sources of Protein o Meat sources: chicken, [place], salmon, tuna. Limit to 4 ounces of meat at one time or the size of your palm. o Consider limiting dairy sources, but when choosing dairy focus on: PLAIN [place] yogurt, cottage cheese, high-protein milk . Fruit o Choose berries

## 2020-07-14 LAB — TSH: TSH: 1.12 mIU/L (ref 0.40–4.50)

## 2020-07-14 LAB — CBC WITH DIFFERENTIAL/PLATELET
Absolute Monocytes: 505 cells/uL (ref 200–950)
Basophils Absolute: 40 cells/uL (ref 0–200)
Basophils Relative: 0.8 %
Eosinophils Absolute: 180 cells/uL (ref 15–500)
Eosinophils Relative: 3.6 %
HCT: 46.6 % (ref 38.5–50.0)
Hemoglobin: 15.8 g/dL (ref 13.2–17.1)
Lymphs Abs: 1185 cells/uL (ref 850–3900)
MCH: 32.6 pg (ref 27.0–33.0)
MCHC: 33.9 g/dL (ref 32.0–36.0)
MCV: 96.3 fL (ref 80.0–100.0)
MPV: 10.7 fL (ref 7.5–12.5)
Monocytes Relative: 10.1 %
Neutro Abs: 3090 cells/uL (ref 1500–7800)
Neutrophils Relative %: 61.8 %
Platelets: 213 10*3/uL (ref 140–400)
RBC: 4.84 10*6/uL (ref 4.20–5.80)
RDW: 12.6 % (ref 11.0–15.0)
Total Lymphocyte: 23.7 %
WBC: 5 10*3/uL (ref 3.8–10.8)

## 2020-07-14 LAB — COMPLETE METABOLIC PANEL WITH GFR
AG Ratio: 1.6 (calc) (ref 1.0–2.5)
ALT: 12 U/L (ref 9–46)
AST: 16 U/L (ref 10–35)
Albumin: 4.1 g/dL (ref 3.6–5.1)
Alkaline phosphatase (APISO): 74 U/L (ref 35–144)
BUN: 21 mg/dL (ref 7–25)
CO2: 31 mmol/L (ref 20–32)
Calcium: 9.6 mg/dL (ref 8.6–10.3)
Chloride: 103 mmol/L (ref 98–110)
Creat: 1.18 mg/dL (ref 0.70–1.25)
GFR, Est African American: 74 mL/min/{1.73_m2} (ref >=60)
GFR, Est Non African American: 64 mL/min/{1.73_m2} (ref >=60)
Globulin: 2.6 g/dL (calc) (ref 1.9–3.7)
Glucose, Bld: 114 mg/dL — ABNORMAL HIGH (ref 65–99)
Potassium: 4 mmol/L (ref 3.5–5.3)
Sodium: 140 mmol/L (ref 135–146)
Total Bilirubin: 0.7 mg/dL (ref 0.2–1.2)
Total Protein: 6.7 g/dL (ref 6.1–8.1)

## 2020-07-14 LAB — LIPID PANEL
Cholesterol: 131 mg/dL (ref <200)
HDL: 34 mg/dL — ABNORMAL LOW (ref >=40)
LDL Cholesterol (Calc): 73 mg/dL (calc)
Non-HDL Cholesterol (Calc): 97 mg/dL (calc) (ref <130)
Total CHOL/HDL Ratio: 3.9 (calc) (ref <5.0)
Triglycerides: 165 mg/dL — ABNORMAL HIGH (ref <150)

## 2020-07-14 LAB — VITAMIN D 25 HYDROXY (VIT D DEFICIENCY, FRACTURES): Vit D, 25-Hydroxy: 78 ng/mL (ref 30–100)

## 2020-09-04 ENCOUNTER — Other Ambulatory Visit: Payer: Self-pay | Admitting: Internal Medicine

## 2020-09-04 DIAGNOSIS — K219 Gastro-esophageal reflux disease without esophagitis: Secondary | ICD-10-CM

## 2020-09-04 DIAGNOSIS — N320 Bladder-neck obstruction: Secondary | ICD-10-CM

## 2020-09-04 DIAGNOSIS — M159 Polyosteoarthritis, unspecified: Secondary | ICD-10-CM

## 2020-09-04 DIAGNOSIS — I10 Essential (primary) hypertension: Secondary | ICD-10-CM

## 2020-09-04 MED ORDER — PANTOPRAZOLE SODIUM 40 MG PO TBEC: DELAYED_RELEASE_TABLET | ORAL | 0 refills | Status: DC

## 2020-09-04 MED ORDER — TAMSULOSIN HCL 0.4 MG PO CAPS: ORAL_CAPSULE | ORAL | 0 refills | Status: DC

## 2020-09-04 MED ORDER — MELOXICAM 15 MG PO TABS: ORAL_TABLET | ORAL | 0 refills | Status: DC

## 2020-09-04 MED ORDER — HYDROCHLOROTHIAZIDE 12.5 MG PO CAPS: ORAL_CAPSULE | ORAL | 0 refills | Status: DC

## 2020-09-04 MED ORDER — ATORVASTATIN CALCIUM 40 MG PO TABS: ORAL_TABLET | ORAL | 0 refills | Status: DC

## 2020-09-04 MED ORDER — ATENOLOL 25 MG PO TABS: ORAL_TABLET | ORAL | 0 refills | Status: DC

## 2020-10-16 ENCOUNTER — Other Ambulatory Visit: Payer: Self-pay | Admitting: Internal Medicine

## 2020-10-16 DIAGNOSIS — M159 Polyosteoarthritis, unspecified: Secondary | ICD-10-CM

## 2020-10-26 ENCOUNTER — Other Ambulatory Visit: Payer: Self-pay

## 2020-10-26 ENCOUNTER — Encounter: Payer: Self-pay | Admitting: Internal Medicine

## 2020-10-26 ENCOUNTER — Ambulatory Visit (INDEPENDENT_AMBULATORY_CARE_PROVIDER_SITE_OTHER): Payer: Medicare Other | Admitting: Internal Medicine

## 2020-10-26 VITALS — BP 130/86 | HR 59 | Temp 97.5°F | Ht 70.0 in | Wt 205.0 lb

## 2020-10-26 DIAGNOSIS — R195 Other fecal abnormalities: Secondary | ICD-10-CM

## 2020-10-26 DIAGNOSIS — Z8249 Family history of ischemic heart disease and other diseases of the circulatory system: Secondary | ICD-10-CM | POA: Diagnosis not present

## 2020-10-26 DIAGNOSIS — K219 Gastro-esophageal reflux disease without esophagitis: Secondary | ICD-10-CM

## 2020-10-26 DIAGNOSIS — E782 Mixed hyperlipidemia: Secondary | ICD-10-CM

## 2020-10-26 DIAGNOSIS — I10 Essential (primary) hypertension: Secondary | ICD-10-CM | POA: Diagnosis not present

## 2020-10-26 DIAGNOSIS — Z136 Encounter for screening for cardiovascular disorders: Secondary | ICD-10-CM | POA: Diagnosis not present

## 2020-10-26 DIAGNOSIS — F17200 Nicotine dependence, unspecified, uncomplicated: Secondary | ICD-10-CM

## 2020-10-26 DIAGNOSIS — Z1211 Encounter for screening for malignant neoplasm of colon: Secondary | ICD-10-CM

## 2020-10-26 DIAGNOSIS — Z0000 Encounter for general adult medical examination without abnormal findings: Secondary | ICD-10-CM

## 2020-10-26 DIAGNOSIS — Z79899 Other long term (current) drug therapy: Secondary | ICD-10-CM

## 2020-10-26 DIAGNOSIS — E559 Vitamin D deficiency, unspecified: Secondary | ICD-10-CM | POA: Diagnosis not present

## 2020-10-26 DIAGNOSIS — Z0001 Encounter for general adult medical examination with abnormal findings: Secondary | ICD-10-CM

## 2020-10-26 DIAGNOSIS — R7309 Other abnormal glucose: Secondary | ICD-10-CM

## 2020-10-26 DIAGNOSIS — N138 Other obstructive and reflux uropathy: Secondary | ICD-10-CM

## 2020-10-26 DIAGNOSIS — N401 Enlarged prostate with lower urinary tract symptoms: Secondary | ICD-10-CM

## 2020-10-26 DIAGNOSIS — Z125 Encounter for screening for malignant neoplasm of prostate: Secondary | ICD-10-CM

## 2020-10-26 DIAGNOSIS — Z8673 Personal history of transient ischemic attack (TIA), and cerebral infarction without residual deficits: Secondary | ICD-10-CM

## 2020-10-26 NOTE — Patient Instructions (Signed)

## 2020-10-26 NOTE — Progress Notes (Signed)
Annual  Screening/Preventative Visit  & Comprehensive Evaluation & Examination      This very nice 69 y.o.  DWM presents for a Screening /Preventative Visit & comprehensive evaluation and management of multiple medical co-morbidities.  Patient has been followed for HTN, HLD, Prediabetes and Vitamin D Deficiency. Patient has GERD controlled w/Protonix.     HTN predates since 2002. Patient's BP has been controlled at home.  Today's BP is at goal - 130/86.  Patient has hx/o CVA/TIA in 2002 with LUE weakness which recovered. Patient denies any cardiac symptoms as chest pain, palpitations, shortness of breath, dizziness or ankle swelling.      Patient's hyperlipidemia is controlled with diet and Atorvastatin. Patient denies myalgias or other medication SE's. Last lipids were at goal except slightly elevated Trig's:  Lab Results  Component Value Date   CHOL 131 07/13/2020   HDL 34 (L) 07/13/2020   LDLCALC 73 07/13/2020   TRIG 165 (H) 07/13/2020   CHOLHDL 3.9 07/13/2020       Patient has hx/o prediabetes (A1c 5.7% /2013) and patient denies reactive hypoglycemic symptoms, visual blurring, diabetic polys or paresthesias. Last A1c was normal & at goal:   Lab Results  Component Value Date   HGBA1C 5.2 04/11/2020         Finally, patient has history of Vitamin D Deficiency ("36"/2008) and last vitamin D was at goal:   Lab Results  Component Value Date   VD25OH 78 07/13/2020    Current Outpatient Medications on File Prior to Visit  Medication Sig  . aspirin EC 81 MG tablet Take  daily.  Marland Kitchen atenolol  25 MG tablet Take 1 tablet  2 x /day   . atorvastatin  40 MG tablet Take 1 tablet 3 days /week  on MWF    for Cholesterol  . VIT B 12    500 mcg tab Take 1 tablet  daily.  . hydrochlorothiazide 12.5 MG  Take 1 capsule Daily  . meloxicam  15 MG tablet TAKE 1/2 TO 1 TAB EVERY DAY   . pantoprazole  40 MG tablet Take 1 tablet Daily  . tadalafil 20 MG tablet Take 1/2 to 1 tablet every 2 to 3  days as needed   . tamsulosin  0.4 MG  Take  1 capsule   Daily  . VITAMIN D  Take 10,000 Units  daily.     Allergies  Allergen Reactions  . Sulfa Antibiotics Nausea Only    Past Medical History:  Diagnosis Date  . ED (erectile dysfunction)   . GERD (gastroesophageal reflux disease)   . Hyperlipidemia   . Hypertension   . Pre-diabetes   . Stroke ([place])   . TIA (transient ischemic attack)   . Vitamin D deficiency    Health Maintenance  Topic Date Due  . COVID-19 Vaccine (1) Never done  . INFLUENZA VACCINE  Never done  . Fecal DNA (Cologuard)  05/11/2021  . TETANUS/TDAP  12/14/2024  . Hepatitis C Screening  Completed  . PNA vac Low Risk Adult  Completed   Immunization History  Administered Date(s) Administered  . PPD Test 12/13/2013, 12/14/2014, 01/11/2016, 02/28/2017  . Pneumococcal Conjugate-13 08/17/2018  . Pneumococcal Polysaccharide-23 09/28/2019  . Pneumococcal-Unspecified 11/24/2008  . Td 11/11/2004  . Tdap 12/14/2014   Colonoscopy - Refused in past  Cologard -  (+) Positive  07/01//2019 - declined GI Referral   Referred again to GI for Colonoscopy on 09/28/2019 and patient refused to follow-up.   Past Surgical  History:  Procedure Laterality Date  . HERNIA REPAIR Right    INGUINAL   Family History  Problem Relation Age of Onset  . Cancer Mother        LYMPHOMA  . Hyperlipidemia Mother   . Hypertension Mother   . Diabetes Father   . Hypertension Father   . Stroke Father   . Kidney disease Father   . Diabetes Sister   . Early death Daughter   . Drug abuse Son        DIED FROM OD   Social History   Socioeconomic History  . Marital status: Single    Spouse name: Not on file  . Number of children: Not on file  . Years of education: Not on file  . Highest education level: Not on file  Occupational History  . Not on file  Tobacco Use  . Smoking status: Current Every Day Smoker    Packs/day: 0.50    Years: 42.00    Pack years: 21.00     Types: Cigarettes  . Smokeless tobacco: Never Used  . Tobacco comment: trying to quit and smoking 2 to 5 cigarettes a day.  Substance and Sexual Activity  . Alcohol use: No    Alcohol/week: 0.0 standard drinks    Comment: OCC  . Drug use: No  . Sexual activity: Yes    Birth control/protection: None    ROS Constitutional: Denies fever, chills, weight loss/gain, headaches, insomnia,  night sweats or change in appetite. Does c/o fatigue. Eyes: Denies redness, blurred vision, diplopia, discharge, itchy or watery eyes.  ENT: Denies discharge, congestion, post nasal drip, epistaxis, sore throat, earache, hearing loss, dental pain, Tinnitus, Vertigo, Sinus pain or snoring.  Cardio: Denies chest pain, palpitations, irregular heartbeat, syncope, dyspnea, diaphoresis, orthopnea, PND, claudication or edema Respiratory: denies cough, dyspnea, DOE, pleurisy, hoarseness, laryngitis or wheezing.  Gastrointestinal: Denies dysphagia, heartburn, reflux, water brash, pain, cramps, nausea, vomiting, bloating, diarrhea, constipation, hematemesis, melena, hematochezia, jaundice or hemorrhoids Genitourinary: Denies dysuria, frequency, discharge, hematuria or flank pain. Has urgency, nocturia x 2-3 & occasional hesitancy. Musculoskeletal: Denies arthralgia, myalgia, stiffness, Jt. Swelling, pain, limp or strain/sprain. Denies Falls. Skin: Denies puritis, rash, hives, warts, acne, eczema or change in skin lesion Neuro: No weakness, tremor, incoordination, spasms, paresthesia or pain Psychiatric: Denies confusion, memory loss or sensory loss. Denies Depression. Endocrine: Denies change in weight, skin, hair change, nocturia, and paresthesia, diabetic polys, visual blurring or hyper / hypo glycemic episodes.  Heme/Lymph: No excessive bleeding, bruising or enlarged lymph nodes.  Physical Exam  BP 130/86   Pulse (!) 59   Temp (!) 97.5 F (36.4 C)   Ht 5\' 10"  (1.778 m)   Wt 205 lb (93 kg)   SpO2 97%   BMI 29.41  kg/m   General Appearance: Well nourished and well groomed and in no apparent distress.  Eyes: PERRLA, EOMs, conjunctiva no swelling or erythema, normal fundi and vessels. Sinuses: No frontal/maxillary tenderness ENT/Mouth: EACs patent / TMs  nl. Nares clear without erythema, swelling, mucoid exudates. Oral hygiene is good. No erythema, swelling, or exudate. Tongue normal, non-obstructing. Tonsils not swollen or erythematous. Hearing normal.  Neck: Supple, thyroid not palpable. No bruits, nodes or JVD. Respiratory: Respiratory effort normal.  BS equal and clear bilateral without rales, rhonci, wheezing or stridor. Cardio: Heart sounds are normal with regular rate and rhythm and no murmurs, rubs or gallops. Peripheral pulses are normal and equal bilaterally without edema. No aortic or femoral bruits. Chest: symmetric with  normal excursions and percussion.  Abdomen: Soft, with Nl bowel sounds. Nontender, no guarding, rebound, hernias, masses, or organomegaly.  Lymphatics: Non tender without lymphadenopathy.  Musculoskeletal: Full ROM all peripheral extremities, joint stability, 5/5 strength, and normal gait. Skin: Warm and dry without rashes, lesions, cyanosis, clubbing or  ecchymosis.  Neuro: Cranial nerves intact, reflexes equal bilaterally. Normal muscle tone, no cerebellar symptoms. Sensation intact.  Pysch: Alert and oriented X 3 with normal affect, insight and judgment appropriate.   Assessment and Plan  1. Annual Preventative/Screening Exam    2. Essential hypertension  - EKG 12-Lead - [place], RETROPERITNL ABD,  LTD - Urinalysis, Routine w reflex microscopic - Microalbumin / creatinine urine ratio - CBC with Differential/Platelet - COMPLETE METABOLIC PANEL WITH GFR - Magnesium - TSH  3. Hyperlipidemia, mixed  - EKG 12-Lead - [place], RETROPERITNL ABD,  LTD - Lipid panel - TSH  4. Abnormal glucose  - Hemoglobin A1c - Insulin, random  5. Vitamin D deficiency  - VITAMIN D 25  Hydroxy   6. Gastroesophageal reflux disease  - CBC with Differential/Platelet  7. History of cardioembolic cerebrovascular accident (CVA)  - EKG 12-Lead  8. Positive colorectal cancer screening using Cologuard test (2019)    9. BPH with obstruction/lower urinary tract symptoms  - Urinalysis, Routine w reflex microscopic - Microalbumin / creatinine urine ratio - PSA  10. Prostate cancer screening  - PSA  11. Screening for colorectal cancer   12. Screening for ischemic heart disease  - EKG 12-Lead  13. FHx: heart disease  - EKG 12-Lead - [place], RETROPERITNL ABD,  LTD  14. Smoker  - EKG 12-Lead - [place], RETROPERITNL ABD,  LTD  15. Screening for AAA (aortic abdominal aneurysm)  - [place], RETROPERITNL ABD,  LTD  16. Medication management  - Urinalysis, Routine w reflex microscopic - Microalbumin / creatinine urine ratio - CBC with Differential/Platelet - COMPLETE METABOLIC PANEL WITH GFR - Magnesium - Lipid panel - TSH - Hemoglobin A1c - Insulin, random - VITAMIN D 25 Hydroxy         Patient was counseled in prudent diet, weight control to achieve/maintain BMI less than 25, BP monitoring, regular exercise and medications as discussed.  Discussed med effects and SE's. Routine screening labs and tests as requested with regular follow-up as recommended. Over 40 minutes of exam, counseling, chart review and high complex critical decision making was performed   Kirtland Bouchard, MD

## 2020-10-27 LAB — HEMOGLOBIN A1C
Hgb A1c MFr Bld: 5.4 % of total Hgb (ref <5.7)
Mean Plasma Glucose: 108 mg/dL
eAG (mmol/L): 6 mmol/L

## 2020-10-27 LAB — CBC WITH DIFFERENTIAL/PLATELET
Absolute Monocytes: 599 cells/uL (ref 200–950)
Basophils Absolute: 42 cells/uL (ref 0–200)
Basophils Relative: 0.8 %
Eosinophils Absolute: 148 cells/uL (ref 15–500)
Eosinophils Relative: 2.8 %
HCT: 48.3 % (ref 38.5–50.0)
Hemoglobin: 16.5 g/dL (ref 13.2–17.1)
Lymphs Abs: 1405 cells/uL (ref 850–3900)
MCH: 32.6 pg (ref 27.0–33.0)
MCHC: 34.2 g/dL (ref 32.0–36.0)
MCV: 95.5 fL (ref 80.0–100.0)
MPV: 10.7 fL (ref 7.5–12.5)
Monocytes Relative: 11.3 %
Neutro Abs: 3106 cells/uL (ref 1500–7800)
Neutrophils Relative %: 58.6 %
Platelets: 234 10*3/uL (ref 140–400)
RBC: 5.06 10*6/uL (ref 4.20–5.80)
RDW: 12.2 % (ref 11.0–15.0)
Total Lymphocyte: 26.5 %
WBC: 5.3 10*3/uL (ref 3.8–10.8)

## 2020-10-27 LAB — COMPLETE METABOLIC PANEL WITH GFR
AG Ratio: 1.5 (calc) (ref 1.0–2.5)
ALT: 12 U/L (ref 9–46)
AST: 16 U/L (ref 10–35)
Albumin: 4.1 g/dL (ref 3.6–5.1)
Alkaline phosphatase (APISO): 80 U/L (ref 35–144)
BUN: 20 mg/dL (ref 7–25)
CO2: 27 mmol/L (ref 20–32)
Calcium: 9.8 mg/dL (ref 8.6–10.3)
Chloride: 102 mmol/L (ref 98–110)
Creat: 1.12 mg/dL (ref 0.70–1.25)
GFR, Est African American: 78 mL/min/{1.73_m2} (ref >=60)
GFR, Est Non African American: 68 mL/min/{1.73_m2} (ref >=60)
Globulin: 2.7 g/dL (calc) (ref 1.9–3.7)
Glucose, Bld: 84 mg/dL (ref 65–99)
Potassium: 4.1 mmol/L (ref 3.5–5.3)
Sodium: 140 mmol/L (ref 135–146)
Total Bilirubin: 0.6 mg/dL (ref 0.2–1.2)
Total Protein: 6.8 g/dL (ref 6.1–8.1)

## 2020-10-27 LAB — URINALYSIS, ROUTINE W REFLEX MICROSCOPIC
Bilirubin Urine: NEGATIVE
Glucose, UA: NEGATIVE
Hgb urine dipstick: NEGATIVE
Ketones, ur: NEGATIVE
Leukocytes,Ua: NEGATIVE
Nitrite: NEGATIVE
Protein, ur: NEGATIVE
Specific Gravity, Urine: 1.022 (ref 1.001–1.03)
pH: <=5 (ref 5.0–8.0)

## 2020-10-27 LAB — LIPID PANEL
Cholesterol: 149 mg/dL (ref <200)
HDL: 35 mg/dL — ABNORMAL LOW (ref >=40)
LDL Cholesterol (Calc): 84 mg/dL (calc)
Non-HDL Cholesterol (Calc): 114 mg/dL (calc) (ref <130)
Total CHOL/HDL Ratio: 4.3 (calc) (ref <5.0)
Triglycerides: 207 mg/dL — ABNORMAL HIGH (ref <150)

## 2020-10-27 LAB — PSA: PSA: 0.6 ng/mL (ref <=4.0)

## 2020-10-27 LAB — MICROALBUMIN / CREATININE URINE RATIO
Creatinine, Urine: 243 mg/dL (ref 20–320)
Microalb Creat Ratio: 5 mcg/mg creat (ref <30)
Microalb, Ur: 1.3 mg/dL

## 2020-10-27 LAB — INSULIN, RANDOM: Insulin: 9.1 u[IU]/mL

## 2020-10-27 LAB — MAGNESIUM: Magnesium: 1.9 mg/dL (ref 1.5–2.5)

## 2020-10-27 LAB — TSH: TSH: 2.02 mIU/L (ref 0.40–4.50)

## 2020-10-27 LAB — VITAMIN D 25 HYDROXY (VIT D DEFICIENCY, FRACTURES): Vit D, 25-Hydroxy: 85 ng/mL (ref 30–100)

## 2020-10-27 NOTE — Progress Notes (Signed)
========================================================== °========================================================== ° °-    PSA - Low - Great  ==========================================================  -  Total Chol = 149   and LDL Chol = 84 - Both  Excellent   - Very low risk for Heart Attack  / Stroke ========================================================  - But . . . . . . . . . . . Triglycerides (   207  ) or fats in blood are too high  (goal is less than 150)    - Recommend avoid fried & greasy foods,  sweets / candy,   - Avoid white rice  (brown or wild rice or Quinoa is OK),   - Avoid white potatoes  (sweet potatoes are OK)   - Avoid anything made from white flour  - bagels, doughnuts, rolls, buns, biscuits, white and   wheat breads, pizza crust and traditional  pasta made of white flour & egg white  - (vegetarian pasta or spinach or wheat pasta is OK).    - Multi-grain bread is OK - like multi-grain flat bread or  sandwich thins.   - Avoid alcohol in excess.   - Exercise is also important. ==========================================================  -  A1c - Normal -  Great - No Diabetes  ! ==========================================================  -  Vitamin D = 85 - Excellent  - Please keep dose same  ==========================================================  -  All Else - CBC - Kidneys - Electrolytes - Liver - Magnesium & Thyroid    - all  Normal / OK ================================-==========================   - Keep up the [place] Work  ! ==========================================================

## 2020-11-05 ENCOUNTER — Other Ambulatory Visit: Payer: Self-pay | Admitting: Internal Medicine

## 2020-11-05 DIAGNOSIS — N320 Bladder-neck obstruction: Secondary | ICD-10-CM

## 2020-11-05 DIAGNOSIS — K219 Gastro-esophageal reflux disease without esophagitis: Secondary | ICD-10-CM

## 2020-11-07 ENCOUNTER — Telehealth: Payer: Self-pay | Admitting: Internal Medicine

## 2020-11-07 NOTE — Telephone Encounter (Signed)
patient called to ask who the practice and provider is that he was referred to last year for screening colonoscopy. He is now ready to move forward with this screening. Per Dr Lucky Cowboy, placed a new referral to [facility] for screening colonoscopy for patient w/ + Cologuard 2020.

## 2020-11-10 ENCOUNTER — Other Ambulatory Visit: Payer: Self-pay | Admitting: Internal Medicine

## 2020-11-10 DIAGNOSIS — I10 Essential (primary) hypertension: Secondary | ICD-10-CM

## 2020-11-16 ENCOUNTER — Other Ambulatory Visit: Payer: Self-pay | Admitting: Internal Medicine

## 2020-11-20 ENCOUNTER — Encounter: Payer: Self-pay | Admitting: Gastroenterology

## 2020-11-20 ENCOUNTER — Other Ambulatory Visit: Payer: Self-pay

## 2020-11-20 ENCOUNTER — Ambulatory Visit: Payer: Medicare Other | Admitting: Gastroenterology

## 2020-11-20 VITALS — BP 130/80 | HR 67 | Ht 70.0 in | Wt 204.0 lb

## 2020-11-20 DIAGNOSIS — R140 Abdominal distension (gaseous): Secondary | ICD-10-CM

## 2020-11-20 DIAGNOSIS — K5909 Other constipation: Secondary | ICD-10-CM | POA: Diagnosis not present

## 2020-11-20 DIAGNOSIS — R6881 Early satiety: Secondary | ICD-10-CM

## 2020-11-20 DIAGNOSIS — R195 Other fecal abnormalities: Secondary | ICD-10-CM

## 2020-11-20 DIAGNOSIS — R12 Heartburn: Secondary | ICD-10-CM

## 2020-11-20 MED ORDER — PLENVU 140 G PO SOLR: 140.0000 g | ORAL | 0 refills | Status: DC

## 2020-11-20 MED ORDER — PLENVU 140 G PO SOLR
140.0000 g | ORAL | 0 refills | Status: DC
Start: 2020-11-20 — End: 2020-11-20

## 2020-11-20 NOTE — Progress Notes (Signed)
[facility] Consult Note:  History: Steven Bray. 11/20/2020  Referring provider: Unk Pinto, MD  Reason for consult/chief complaint: Colon Cancer Screening (Patient here for positive cologuard. RLQ discomfort)   Subjective  HPI:  This is a very pleasant 70 year old man referred by primary care for positive Cologuard test.  It was done in July 2019, and Steven Bray says that between [place] and caring for his elderly mother who eventually passed away in late Mar 13, 2019, he never did get a colonoscopy done.  He had not had a prior Cologuard or colonoscopy before the 12-Mar-2018 test.  In the last month or 2 has had some intermittent right-sided abdominal pain more toward the lower quadrant.  He has not noticed any definite triggers or relieving factors, and it does not seem to radiate.  He has a least 5 years of heartburn requiring daily acid suppression that seems to work well.  He does not have dysphagia odynophagia, but he does have bloating and early satiety for about the last year.  He has lately started taking MiraLAX a capful daily and has had some relief of the constipation, where previously he would go 2-3 times a week.  He denies rectal bleeding and has no family history of colorectal cancer to his knowledge.   ROS:  Review of Systems  Constitutional: Negative for appetite change and unexpected weight change.  HENT: Negative for mouth sores and voice change.   Eyes: Negative for pain and redness.  Respiratory: Negative for cough and shortness of breath.   Cardiovascular: Negative for chest pain and palpitations.  Genitourinary: Negative for dysuria and hematuria.  Musculoskeletal: Positive for arthralgias. Negative for myalgias.  Skin: Negative for pallor and rash.  Neurological: Negative for weakness and headaches.  Hematological: Negative for adenopathy.     Past Medical History: Past Medical History:  Diagnosis Date  . ED (erectile dysfunction)   .  GERD (gastroesophageal reflux disease)   . Hyperlipidemia   . Hypertension   . Pre-diabetes   . Stroke ([place])   . TIA (transient ischemic attack)   . Vitamin D deficiency      Past Surgical History: Past Surgical History:  Procedure Laterality Date  . HERNIA REPAIR Right    INGUINAL     Family History: Family History  Problem Relation Age of Onset  . Cancer Mother        LYMPHOMA  . Hyperlipidemia Mother   . Hypertension Mother   . Diabetes Father   . Hypertension Father   . Stroke Father   . Kidney disease Father   . Diabetes Sister   . Drug abuse Son        DIED FROM OD  . Early death Daughter   . Colon cancer Neg Hx   . Esophageal cancer Neg Hx   . Pancreatic cancer Neg Hx     Social History: Social History   Socioeconomic History  . Marital status: Single    Spouse name: Not on file  . Number of children: Not on file  . Years of education: Not on file  . Highest education level: Not on file  Occupational History  . Not on file  Tobacco Use  . Smoking status: Current Every Day Smoker    Packs/day: 0.50    Years: 42.00    Pack years: 21.00    Types: Cigarettes  . Smokeless tobacco: Never Used  . Tobacco comment: trying to quit and smoking 2 to 5 cigarettes a day.  Substance and Sexual Activity  . Alcohol use: No    Alcohol/week: 0.0 standard drinks    Comment: OCC  . Drug use: No  . Sexual activity: Yes    Birth control/protection: None  Other Topics Concern  . Not on file  Social History Narrative  . Not on file   Social Determinants of Health   Financial Resource Strain: Not on file  Food Insecurity: Not on file  Transportation Needs: Not on file  Physical Activity: Not on file  Stress: Not on file  Social Connections: Not on file    Allergies: Allergies  Allergen Reactions  . Sulfa Antibiotics Nausea Only    Outpatient Meds: Current Outpatient Medications  Medication Sig Dispense Refill  . aspirin EC 81 MG tablet Take 81 mg  by mouth daily.    Marland Kitchen atenolol (TENORMIN) 25 MG tablet TAKE 1 TABLET BY MOUTH  TWICE DAILY FOR BLOOD  PRESSURE 180 tablet 3  . atorvastatin (LIPITOR) 40 MG tablet Take     1 tablet     3 x /week       on Mon wed Fri        for Cholesterol 39 tablet 0  . Cyanocobalamin (VITAMIN B 12) 500 MCG TABS Take 1 tablet by mouth daily.    . hydrochlorothiazide (MICROZIDE) 12.5 MG capsule TAKE 1 CAPSULE BY MOUTH  DAILY FOR BLOOD PRESSURE  AND FLUID RETENTION/ANKLE  SWELLING 90 capsule 3  . meloxicam (MOBIC) 15 MG tablet TAKE 1/2 TO 1 TABLET BY MOUTH EVERY DAY WITH FOOD FOR PAIN/INFLAMMATION 90 tablet 3  . pantoprazole (PROTONIX) 40 MG tablet Take     1 tablet      Daily      to Prevent Heartburn & Indigestion 90 tablet 0  . PEG-KCl-NaCl-NaSulf-Na Asc-C (PLENVU) 140 g SOLR Take 140 g by mouth as directed. Manufacturer's coupon Universal coupon code:BIN: P2366821; GROUP: [id number]; PCN: [id number]; ID: [id number]; PAY NO MORE $50 1 each 0  . tadalafil (CIALIS) 20 MG tablet Take 1/2 to 1 tablet every 2 to 3 days as needed for XXXX 30 tablet 12  . tamsulosin (FLOMAX) 0.4 MG CAPS capsule Take      1 capsule      Daily      for Prostate 90 capsule 0  . VITAMIN D PO Take 10,000 Units by mouth 3 (three) times a week.     No current facility-administered medications for this visit.      ___________________________________________________________________ Objective   Exam:  BP 130/80   Pulse 67   Ht 5\' 10"  (1.778 m)   Wt 204 lb (92.5 kg)   SpO2 95%   BMI 29.27 kg/m  Wt Readings from Last 3 Encounters:  11/20/20 204 lb (92.5 kg)  10/26/20 205 lb (93 kg)  07/13/20 208 lb (94.3 kg)     General: Well-appearing  Eyes: sclera anicteric, no redness  ENT: oral mucosa moist without lesions, no cervical or supraclavicular lymphadenopathy  CV: RRR without murmur, S1/S2, no JVD, no peripheral edema  Resp: clear to auscultation bilaterally, normal RR and effort noted  GI: soft, minimal RLQ and epigastric  tenderness ( he rates it at a "1 out of 10"), with active bowel sounds. No guarding or palpable organomegaly noted.  Skin; warm and dry, no rash or jaundice noted  Neuro: awake, alert and oriented x 3. Normal gross motor function and fluent speech  Labs:  CBC Latest Ref Rng & Units 10/26/2020 07/13/2020 04/11/2020  WBC  3.8 - 10.8 Thousand/uL 5.3 5.0 5.0  Hemoglobin 13.2 - 17.1 g/dL 16.5 15.8 15.6  Hematocrit 38.5 - 50.0 % 48.3 46.6 46.4  Platelets 140 - 400 Thousand/uL 234 213 218   CMP Latest Ref Rng & Units 10/26/2020 07/13/2020 04/11/2020  Glucose 65 - 99 mg/dL 84 114(H) 89  BUN 7 - 25 mg/dL 20 21 21   Creatinine 0.70 - 1.25 mg/dL 1.12 1.18 0.98  Sodium 135 - 146 mmol/L 140 140 142  Potassium 3.5 - 5.3 mmol/L 4.1 4.0 4.0  Chloride 98 - 110 mmol/L 102 103 105  CO2 20 - 32 mmol/L 27 31 28   Calcium 8.6 - 10.3 mg/dL 9.8 9.6 9.3  Total Protein 6.1 - 8.1 g/dL 6.8 6.7 6.6  Total Bilirubin 0.2 - 1.2 mg/dL 0.6 0.7 0.7  Alkaline Phos 40 - 115 U/L - - -  AST 10 - 35 U/L 16 16 19   ALT 9 - 46 U/L 12 12 16      Positive Cologuard in July 2019   Assessment: Encounter Diagnoses  Name Primary?  . Positive colorectal cancer screening using Cologuard test Yes  . Abdominal bloating   . Chronic constipation   . Early satiety   . Heartburn     Positive Cologuard test 2-1/2 years ago, long overdue for diagnostic colonoscopy.  He has more recent right-sided abdominal pain and longstanding constipation of unclear relation to the Cologuard, but he is becoming increasingly concerned and that is why he finally came to see [place]. Years of heartburn and some more recent early satiety.  He has a smoking history and is therefore at risk for Barrett's esophagus.  Plan:  EGD and colonoscopy.  He was agreeable after discussion of procedure and risks  The benefits and risks of the planned procedure were described in detail with the patient or (when appropriate) their health care proxy.  Risks were outlined as  including, but not limited to, bleeding, infection, perforation, adverse medication reaction leading to cardiac or pulmonary decompensation, pancreatitis (if ERCP).  The limitation of incomplete mucosal visualization was also discussed.  No guarantees or warranties were given.  He will continue the MiraLAX daily, though he was usually only taking it in 4 ounces of water.  I recommend he take it in about 12 ounces of water to help it work better  Thank you for the courtesy of this consult.  Please call me with any questions or concerns.  Nelida Meuse III  CC: Referring provider noted above

## 2020-11-20 NOTE — Patient Instructions (Signed)
If you are age 68 or older, your body mass index should be between 23-30. Your Body mass index is 29.27 kg/m. If this is out of the aforementioned range listed, please consider follow up with your Primary Care Provider.  If you are age 68 or younger, your body mass index should be between 19-25. Your Body mass index is 29.27 kg/m. If this is out of the aformentioned range listed, please consider follow up with your Primary Care Provider.   You have been scheduled for an endoscopy and colonoscopy. Please follow the written instructions given to you at your visit today. Please pick up your prep supplies at the pharmacy within the next 1-3 days. If you use inhalers (even only as needed), please bring them with you on the day of your procedure.  Due to recent changes in healthcare laws, you may see the results of your imaging and laboratory studies on MyChart before your provider has had a chance to review them.  We understand that in some cases there may be results that are confusing or concerning to you. Not all laboratory results come back in the same time frame and the provider may be waiting for multiple results in order to interpret others.  Please give [place] 48 hours in order for your provider to thoroughly review all the results before contacting the office for clarification of your results.    It was a pleasure to see you today!  Dr. [name]

## 2020-11-22 ENCOUNTER — Telehealth: Payer: Self-pay | Admitting: Gastroenterology

## 2020-11-22 MED ORDER — PLENVU 140 G PO SOLR: 140.0000 g | ORAL | 0 refills | Status: DC

## 2020-11-22 NOTE — Telephone Encounter (Signed)
Medicare coupon has been sent to the patients pharmacy

## 2020-11-22 NOTE — Telephone Encounter (Signed)
Inbound call from patient stating prep is not covered under his insurance; will be over $160 out of pocket which he can not afford.  Is requesting alternate prep be sent to [facility] please.

## 2020-11-23 ENCOUNTER — Other Ambulatory Visit: Payer: Self-pay | Admitting: Internal Medicine

## 2020-11-23 DIAGNOSIS — M159 Polyosteoarthritis, unspecified: Secondary | ICD-10-CM

## 2020-11-28 NOTE — Telephone Encounter (Signed)
Prep medication he was given is not covered by his insurance. Says he left many messages with our answering service for Vivien Rota specifically to call him back and is surprised that she has not called him yet. I promised to pass along the message right now.

## 2020-11-29 NOTE — Telephone Encounter (Signed)
Patient has been notified and aware to come to the office and pick up the sample kit of Plenvu here at the office. He will all also call the pharmacy and cx his script. He financially cant afford the prep even with the Medicare coupon

## 2020-12-07 ENCOUNTER — Telehealth: Payer: Self-pay | Admitting: Gastroenterology

## 2020-12-07 NOTE — Telephone Encounter (Signed)
Pt is requesting a call back to see if he needs to reschedule his covid test since his procedure was rescheduled to 2/21.

## 2020-12-08 NOTE — Telephone Encounter (Signed)
Patient has been contact. All questions answered. Appointment for COVID test has been moved, instructions have been completed

## 2020-12-22 ENCOUNTER — Encounter: Payer: Self-pay | Admitting: Gastroenterology

## 2020-12-28 ENCOUNTER — Other Ambulatory Visit: Payer: Self-pay | Admitting: Gastroenterology

## 2020-12-28 DIAGNOSIS — Z1159 Encounter for screening for other viral diseases: Secondary | ICD-10-CM | POA: Diagnosis not present

## 2020-12-28 LAB — SARS CORONAVIRUS 2 (TAT 6-24 HRS): SARS Coronavirus 2: NEGATIVE

## 2021-01-01 ENCOUNTER — Encounter: Payer: Self-pay | Admitting: Gastroenterology

## 2021-01-01 ENCOUNTER — Other Ambulatory Visit: Payer: Self-pay

## 2021-01-01 ENCOUNTER — Ambulatory Visit (AMBULATORY_SURGERY_CENTER): Payer: Medicare Other | Admitting: Gastroenterology

## 2021-01-01 VITALS — BP 113/70 | HR 54 | Temp 97.3°F | Resp 12 | Ht 70.0 in | Wt 204.0 lb

## 2021-01-01 DIAGNOSIS — D123 Benign neoplasm of transverse colon: Secondary | ICD-10-CM

## 2021-01-01 DIAGNOSIS — K2950 Unspecified chronic gastritis without bleeding: Secondary | ICD-10-CM | POA: Diagnosis not present

## 2021-01-01 DIAGNOSIS — K5730 Diverticulosis of large intestine without perforation or abscess without bleeding: Secondary | ICD-10-CM

## 2021-01-01 DIAGNOSIS — R140 Abdominal distension (gaseous): Secondary | ICD-10-CM

## 2021-01-01 DIAGNOSIS — K319 Disease of stomach and duodenum, unspecified: Secondary | ICD-10-CM

## 2021-01-01 DIAGNOSIS — K3189 Other diseases of stomach and duodenum: Secondary | ICD-10-CM | POA: Diagnosis not present

## 2021-01-01 DIAGNOSIS — R12 Heartburn: Secondary | ICD-10-CM

## 2021-01-01 DIAGNOSIS — R195 Other fecal abnormalities: Secondary | ICD-10-CM | POA: Diagnosis not present

## 2021-01-01 DIAGNOSIS — K219 Gastro-esophageal reflux disease without esophagitis: Secondary | ICD-10-CM | POA: Diagnosis not present

## 2021-01-01 DIAGNOSIS — K2970 Gastritis, unspecified, without bleeding: Secondary | ICD-10-CM

## 2021-01-01 MED ORDER — SODIUM CHLORIDE 0.9 % IV SOLN: 500.0000 mL | Freq: Once | INTRAVENOUS | Status: DC

## 2021-01-01 NOTE — Progress Notes (Signed)
1621 Robinul 0.1 mg IV given due large amount of secretions upon assessment.  MD made aware, vss

## 2021-01-01 NOTE — Progress Notes (Signed)
Called to room to assist during endoscopic procedure.  Patient ID and intended procedure confirmed with present staff. Received instructions for my participation in the procedure from the performing physician.  

## 2021-01-01 NOTE — Op Note (Signed)
[facility] Patient Name: Steven Bray Procedure Date: 01/01/2021 4:22 PM MRN: [id number] Endoscopist: Mallie Mussel L. [name] , MD Age: 70 Referring MD:  Date of Birth: [birth year] Gender: Male Account #: [id number] Procedure:                Colonoscopy Indications:              Positive Cologuard test Medicines:                Monitored Anesthesia Care Procedure:                Pre-Anesthesia Assessment:                           - Prior to the procedure, a History and Physical                            was performed, and patient medications and                            allergies were reviewed. The patient's tolerance of                            previous anesthesia was also reviewed. The risks                            and benefits of the procedure and the sedation                            options and risks were discussed with the patient.                            All questions were answered, and informed consent                            was obtained. Prior Anticoagulants: The patient has                            taken no previous anticoagulant or antiplatelet                            agents. ASA Grade Assessment: III - A patient with                            severe systemic disease. After reviewing the risks                            and benefits, the patient was deemed in                            satisfactory condition to undergo the procedure.                           After obtaining informed consent, the colonoscope  was passed under direct vision. Throughout the                            procedure, the patient's blood pressure, pulse, and                            oxygen saturations were monitored continuously. The                            Olympus CF-HQ190L ([id number]) Colonoscope was                            introduced through the anus and advanced to the the                            cecum, identified by appendiceal  orifice and                            ileocecal valve. The colonoscopy was somewhat                            difficult due to a redundant colon. Successful                            completion of the procedure was aided by using                            manual pressure. The patient tolerated the                            procedure well. The quality of the bowel                            preparation was good. The ileocecal valve,                            appendiceal orifice, and rectum were photographed. Scope In: 4:29:13 PM Scope Out: 4:53:12 PM Scope Withdrawal Time: 0 hours 22 minutes 42 seconds  Total Procedure Duration: 0 hours 23 minutes 59 seconds  Findings:                 The perianal and digital rectal examinations were                            normal.                           Two semi-sessile polyps were found in the                            transverse colon. The polyps were 2 to 5 mm in                            size. These polyps were removed with a cold snare.  Resection was complete, but the polyp tissue was                            only partially retrieved.                           Multiple diverticula were found in the left colon.                           The exam was otherwise without abnormality on                            direct and retroflexion views. Complications:            No immediate complications. Estimated Blood Loss:     Estimated blood loss was minimal. Impression:               - Two 2 to 5 mm polyps in the transverse colon,                            removed with a cold snare. Complete resection.                            Partial retrieval.                           - Diverticulosis in the left colon.                           - The examination was otherwise normal on direct                            and retroflexion views. Recommendation:           - Patient has a contact number available for                             emergencies. The signs and symptoms of potential                            delayed complications were discussed with the                            patient. Return to normal activities tomorrow.                            Written discharge instructions were provided to the                            patient.                           - Resume previous diet.                           - Continue present medications.                           -  Await pathology results.                           - Repeat colonoscopy is recommended for                            surveillance. The colonoscopy date will be                            determined after pathology results from today's                            exam become available for review.                           - See the other procedure note for documentation of                            additional recommendations. Jamisyn Langer L. [name], MD 01/01/2021 5:08:44 PM This report has been signed electronically.

## 2021-01-01 NOTE — Progress Notes (Signed)
Report given to PACU, vss 

## 2021-01-01 NOTE — Progress Notes (Addendum)
JB - Check-in  DC - VS   Pt has a "photo sensitivity to any medications that is photo sensitive by the sun" per the pt.  There was no way I could list this in allergies screen.     Pt reported the last time he drank was 13:30 approximatly 16 oz.  York Ram, CRNA was notified by Carver Fila, RN and will be at least 15:30 before we can do procedures.  Pt was notifies. maw

## 2021-01-01 NOTE — Patient Instructions (Signed)
Impression/Recommendations:  GERD, polyp, and diverticulosis handouts given to patient. Resume previous diet. Continue present medications. Await pathology results.  Follow antireflux regimen.  Repeat colonoscopy recommended for surveillance.  Date to be determined after pathology results reviewed.  YOU HAD AN ENDOSCOPIC PROCEDURE TODAY AT [facility]:   Refer to the procedure report that was given to you for any specific questions about what was found during the examination.  If the procedure report does not answer your questions, please call your gastroenterologist to clarify.  If you requested that your care partner not be given the details of your procedure findings, then the procedure report has been included in a sealed envelope for you to review at your convenience later.  YOU SHOULD EXPECT: Some feelings of bloating in the abdomen. Passage of more gas than usual.  Walking can help get rid of the air that was put into your GI tract during the procedure and reduce the bloating. If you had a lower endoscopy (such as a colonoscopy or flexible sigmoidoscopy) you may notice spotting of blood in your stool or on the toilet paper. If you underwent a bowel prep for your procedure, you may not have a normal bowel movement for a few days.  Please Note:  You might notice some irritation and congestion in your nose or some drainage.  This is from the oxygen used during your procedure.  There is no need for concern and it should clear up in a day or so.  SYMPTOMS TO REPORT IMMEDIATELY:   Following lower endoscopy (colonoscopy or flexible sigmoidoscopy):  Excessive amounts of blood in the stool  Significant tenderness or worsening of abdominal pains  Swelling of the abdomen that is new, acute  Fever of 100F or higher   Following upper endoscopy (EGD)  Vomiting of blood or coffee ground material  New chest pain or pain under the shoulder blades  Painful or persistently  difficult swallowing  New shortness of breath  Fever of 100F or higher  Black, tarry-looking stools  For urgent or emergent issues, a gastroenterologist can be reached at any hour by calling [phone]. Do not use MyChart messaging for urgent concerns.    DIET:  We do recommend a small meal at first, but then you may proceed to your regular diet.  Drink plenty of fluids but you should avoid alcoholic beverages for 24 hours.  ACTIVITY:  You should plan to take it easy for the rest of today and you should NOT DRIVE or use heavy machinery until tomorrow (because of the sedation medicines used during the test).    FOLLOW UP: Our staff will call the number listed on your records 48-72 hours following your procedure to check on you and address any questions or concerns that you may have regarding the information given to you following your procedure. If we do not reach you, we will leave a message.  We will attempt to reach you two times.  During this call, we will ask if you have developed any symptoms of COVID 19. If you develop any symptoms (ie: fever, flu-like symptoms, shortness of breath, cough etc.) before then, please call [phone].  If you test positive for Covid 19 in the 2 weeks post procedure, please call and report this information to [place].    If any biopsies were taken you will be contacted by phone or by letter within the next 1-3 weeks.  Please call us at [phone] if you have not heard about  the biopsies in 3 weeks.    SIGNATURES/CONFIDENTIALITY: You and/or your care partner have signed paperwork which will be entered into your electronic medical record.  These signatures attest to the fact that that the information above on your After Visit Summary has been reviewed and is understood.  Full responsibility of the confidentiality of this discharge information lies with you and/or your care-partner.

## 2021-01-01 NOTE — Op Note (Signed)
[facility] Patient Name: Steven Bray Procedure Date: 01/01/2021 4:21 PM MRN: [id number] Endoscopist: Mallie Mussel L. [name] , MD Age: 70 Referring MD:  Date of Birth: 20-Feb-1951 Gender: Male Account #: [id number] Procedure:                Upper GI endoscopy Indications:              Esophageal reflux symptoms that recur despite                            appropriate therapy, Screening for Barrett's                            esophagus in patient at risk for this condition Medicines:                Monitored Anesthesia Care Procedure:                Pre-Anesthesia Assessment:                           - Prior to the procedure, a History and Physical                            was performed, and patient medications and                            allergies were reviewed. The patient's tolerance of                            previous anesthesia was also reviewed. The risks                            and benefits of the procedure and the sedation                            options and risks were discussed with the patient.                            All questions were answered, and informed consent                            was obtained. Prior Anticoagulants: The patient has                            taken no previous anticoagulant or antiplatelet                            agents. ASA Grade Assessment: III - A patient with                            severe systemic disease. After reviewing the risks                            and benefits, the patient was deemed in  satisfactory condition to undergo the procedure.                           After obtaining informed consent, the endoscope was                            passed under direct vision. Throughout the                            procedure, the patient's blood pressure, pulse, and                            oxygen saturations were monitored continuously. The                            Endoscope was  introduced through the mouth, and                            advanced to the second part of duodenum. The upper                            GI endoscopy was accomplished without difficulty.                            The patient tolerated the procedure well. Scope In: Scope Out: 5:03:59 PM Findings:                 The larynx was normal.                           The esophagus was normal. LES somewhat patulous.                           Diffuse congested mucosa was found in the gastric                            body. Several biopsies were obtained in the gastric                            body and in the gastric antrum with cold forceps                            for histology.                           The cardia and gastric fundus were normal on                            retroflexion. (Hill grade 3)                           The examined duodenum was normal. Complications:            No immediate complications. Estimated Blood Loss:     Estimated blood loss was minimal. Impression:               -  Normal larynx.                           - Normal esophagus.                           - Congestive gastropathy.                           - Normal examined duodenum.                           - Several biopsies were obtained in the gastric                            body and in the gastric antrum. Recommendation:           - Patient has a contact number available for                            emergencies. The signs and symptoms of potential                            delayed complications were discussed with the                            patient. Return to normal activities tomorrow.                            Written discharge instructions were provided to the                            patient.                           - Resume previous diet.                           - Continue present medications.                           - Await pathology results.                           - See  the other procedure note for documentation of                            additional recommendations.                           - Follow an antireflux regimen indefinitely. [name] L. [name], MD 01/01/2021 5:12:43 PM This report has been signed electronically.

## 2021-01-03 ENCOUNTER — Telehealth: Payer: Self-pay

## 2021-01-03 NOTE — Telephone Encounter (Signed)
  Follow up Call-  Call back number 01/01/2021  Post procedure Call Back phone  # [phone] cell  Permission to leave phone message Yes  Some recent data might be hidden     Patient questions:  Do you have a fever, pain , or abdominal swelling? No. Pain Score  0 *  Have you tolerated food without any problems? Yes.    Have you been able to return to your normal activities? Yes.    Do you have any questions about your discharge instructions: Diet   No. Medications  No. Follow up visit  No.  Do you have questions or concerns about your Care? No.  Actions: * If pain score is 4 or above: 1. No action needed, pain <4.Have you developed a fever since your procedure? no  2.   Have you had an respiratory symptoms (SOB or cough) since your procedure? no  3.   Have you tested positive for COVID 19 since your procedure no  4.   Have you had any family members/close contacts diagnosed with the COVID 19 since your procedure?  no   If yes to any of these questions please route to Joylene John, RN and Joella Prince, RN

## 2021-01-05 ENCOUNTER — Encounter: Payer: Medicare Other | Admitting: Gastroenterology

## 2021-01-10 ENCOUNTER — Encounter: Payer: Self-pay | Admitting: Gastroenterology

## 2021-02-07 ENCOUNTER — Other Ambulatory Visit: Payer: Self-pay

## 2021-02-07 ENCOUNTER — Ambulatory Visit (INDEPENDENT_AMBULATORY_CARE_PROVIDER_SITE_OTHER): Payer: Medicare Other | Admitting: Adult Health Nurse Practitioner

## 2021-02-07 ENCOUNTER — Encounter: Payer: Self-pay | Admitting: Adult Health Nurse Practitioner

## 2021-02-07 VITALS — BP 130/70 | HR 50 | Temp 97.5°F | Ht 70.0 in | Wt 206.0 lb

## 2021-02-07 DIAGNOSIS — Z720 Tobacco use: Secondary | ICD-10-CM

## 2021-02-07 DIAGNOSIS — K219 Gastro-esophageal reflux disease without esophagitis: Secondary | ICD-10-CM

## 2021-02-07 DIAGNOSIS — E559 Vitamin D deficiency, unspecified: Secondary | ICD-10-CM

## 2021-02-07 DIAGNOSIS — I10 Essential (primary) hypertension: Secondary | ICD-10-CM

## 2021-02-07 DIAGNOSIS — N138 Other obstructive and reflux uropathy: Secondary | ICD-10-CM | POA: Diagnosis not present

## 2021-02-07 DIAGNOSIS — R7309 Other abnormal glucose: Secondary | ICD-10-CM

## 2021-02-07 DIAGNOSIS — R195 Other fecal abnormalities: Secondary | ICD-10-CM | POA: Diagnosis not present

## 2021-02-07 DIAGNOSIS — N401 Enlarged prostate with lower urinary tract symptoms: Secondary | ICD-10-CM

## 2021-02-07 DIAGNOSIS — E782 Mixed hyperlipidemia: Secondary | ICD-10-CM

## 2021-02-07 DIAGNOSIS — E663 Overweight: Secondary | ICD-10-CM

## 2021-02-07 LAB — CBC WITH DIFFERENTIAL/PLATELET
Absolute Monocytes: 787 cells/uL (ref 200–950)
Basophils Absolute: 38 cells/uL (ref 0–200)
Basophils Relative: 0.6 %
Eosinophils Absolute: 122 cells/uL (ref 15–500)
Eosinophils Relative: 1.9 %
HCT: 48.4 % (ref 38.5–50.0)
Hemoglobin: 16.2 g/dL (ref 13.2–17.1)
Lymphs Abs: 1056 cells/uL (ref 850–3900)
MCH: 31.6 pg (ref 27.0–33.0)
MCHC: 33.5 g/dL (ref 32.0–36.0)
MCV: 94.5 fL (ref 80.0–100.0)
MPV: 10.4 fL (ref 7.5–12.5)
Monocytes Relative: 12.3 %
Neutro Abs: 4397 cells/uL (ref 1500–7800)
Neutrophils Relative %: 68.7 %
Platelets: 240 10*3/uL (ref 140–400)
RBC: 5.12 10*6/uL (ref 4.20–5.80)
RDW: 12.2 % (ref 11.0–15.0)
Total Lymphocyte: 16.5 %
WBC: 6.4 10*3/uL (ref 3.8–10.8)

## 2021-02-07 LAB — LIPID PANEL
Cholesterol: 145 mg/dL (ref <200)
HDL: 32 mg/dL — ABNORMAL LOW (ref >=40)
LDL Cholesterol (Calc): 83 mg/dL (calc)
Non-HDL Cholesterol (Calc): 113 mg/dL (calc) (ref <130)
Total CHOL/HDL Ratio: 4.5 (calc) (ref <5.0)
Triglycerides: 209 mg/dL — ABNORMAL HIGH (ref <150)

## 2021-02-07 LAB — COMPLETE METABOLIC PANEL WITH GFR
AG Ratio: 1.7 (calc) (ref 1.0–2.5)
ALT: 13 U/L (ref 9–46)
AST: 15 U/L (ref 10–35)
Albumin: 4.3 g/dL (ref 3.6–5.1)
Alkaline phosphatase (APISO): 87 U/L (ref 35–144)
BUN: 17 mg/dL (ref 7–25)
CO2: 31 mmol/L (ref 20–32)
Calcium: 10 mg/dL (ref 8.6–10.3)
Chloride: 102 mmol/L (ref 98–110)
Creat: 1.02 mg/dL (ref 0.70–1.25)
GFR, Est African American: 88 mL/min/{1.73_m2} (ref >=60)
GFR, Est Non African American: 76 mL/min/{1.73_m2} (ref >=60)
Globulin: 2.5 g/dL (calc) (ref 1.9–3.7)
Glucose, Bld: 89 mg/dL (ref 65–99)
Potassium: 4.2 mmol/L (ref 3.5–5.3)
Sodium: 140 mmol/L (ref 135–146)
Total Bilirubin: 0.7 mg/dL (ref 0.2–1.2)
Total Protein: 6.8 g/dL (ref 6.1–8.1)

## 2021-02-07 NOTE — Progress Notes (Signed)
FOLLOW UP 3 MONTH  Assessment:   Essential hypertension -Continue current medications: atenolol 25mg  BID, HCTZ 12.5mg  Monitor blood pressure at home; call if consistently over 130/80 Continue DASH diet.   Reminder to go to the ER if any CP, SOB, nausea, dizziness, severe HA, changes vision/speech, left arm numbness and tingling and jaw pain.  Other abnormal glucose Discussed disease progression and risks Discussed diet/exercise, weight management and risk modification  Mixed hyperlipidemia Continue medications: Aorvastin 40mg  three days a week. Discussed dietary and exercise modifications Low fat diet  Tobacco abuse Declines low dose CT scan Discussed Chest X-ray yearly Continues to smoke Discussed risks associated with tobacco use and advised to reduce or quit Patient is not ready to do so, but advised to consider strongly Will follow up at the next visit  Gastroesophageal reflux disease, esophagitis presence not specified Well managed on current medications Discussed diet, avoiding triggers and other lifestyle changes  Vitamin D deficiency Continue supplement  Overweight (BMI 25.0-29.9) - long discussion about weight loss, diet, and exercise -recommended diet heavy in fruits and veggies and low in animal meats, cheeses, and dairy products  BPH associated with nocturia Continue meds  Medication management Continued  Further disposition pending results if labs check today. Discussed med's effects and SE's.   Over 30 minutes of face to face interview, exam, counseling, chart review, and critical decision making was performed.    Future Appointments  Date Time Provider [place]  05/21/2021  2:30 PM Unk Pinto, MD [facility] None  11/14/2021 11:00 AM Unk Pinto, MD [facility] None      Subjective:  Steven Bray. is a 70 y.o. male who presents for  3 month follow up for HTN, HLD, prediabetes, and vitamin D Def.   He continues to smoke,  he wants to quit. Smoked x 70 years of age, 37 years, 1/2 pack a day or less.  Low dose CT was discussed but declined by patient; CXR was completed 03/29/20.  he has a diagnosis of GERD which is currently managed by protonix 40 mg daily  he reports symptoms is currently well controlled, and denies breakthrough reflux, burning in chest, hoarseness or cough.    BMI is Body mass index is 29.56 kg/m., he has not been working on diet, exercise limited due to weather, but plans to restart walking with weather warming up.  Wt Readings from Last 3 Encounters:  02/07/21 206 lb (93.4 kg)  01/01/21 204 lb (92.5 kg)  11/20/20 204 lb (92.5 kg)   His blood pressure has been controlled at home, HTN predates 2002, today their BP is BP: 130/70.  He does not workout. He denies chest pain, shortness of breath, dizziness.   He is on cholesterol medication (atorvastatin 40 mg MWF) and denies myalgias. His cholesterol is at goal less than 100. The cholesterol last visit was:   Lab Results  Component Value Date   CHOL 149 10/26/2020   HDL 35 (L) 10/26/2020   LDLCALC 84 10/26/2020   TRIG 207 (H) 10/26/2020   CHOLHDL 4.3 10/26/2020   He has been working on diet and exercise for hx of prediabetes (5.7 in 2013), and denies paresthesia of the feet, polydipsia and polyuria. Last A1C in the office was:  Lab Results  Component Value Date   HGBA1C 5.4 10/26/2020   Last GFR Lab Results  Component Value Date   GFRNONAA 68 10/26/2020    Patient is on Vitamin D supplement, was taking 50,000 IU weekly, now taking  10,000 IU daily.  Lab Results  Component Value Date   VD25OH 85 10/26/2020      Medication Review:   Current Outpatient Medications (Cardiovascular):  .  atenolol (TENORMIN) 25 MG tablet, TAKE 1 TABLET BY MOUTH  TWICE DAILY FOR BLOOD  PRESSURE .  atorvastatin (LIPITOR) 40 MG tablet, Take     1 tablet     3 x /week       on Mon wed Fri        for Cholesterol .  hydrochlorothiazide (MICROZIDE) 12.5  MG capsule, TAKE 1 CAPSULE BY MOUTH  DAILY FOR BLOOD PRESSURE  AND FLUID RETENTION/ANKLE  SWELLING .  tadalafil (CIALIS) 20 MG tablet, Take 1/2 to 1 tablet every 2 to 3 days as needed for XXXX   Current Outpatient Medications (Analgesics):  .  aspirin EC 81 MG tablet, Take 81 mg by mouth daily. .  meloxicam (MOBIC) 15 MG tablet, TAKE 1/2 TO 1 TABLET BY  MOUTH DAILY WITH FOOD FOR  PAIN AND INFLAMMATION  Current Outpatient Medications (Hematological):  Marland Kitchen  Cyanocobalamin (VITAMIN B 12) 500 MCG TABS, Take 1 tablet by mouth daily.  Current Outpatient Medications (Other):  .  pantoprazole (PROTONIX) 40 MG tablet, Take     1 tablet      Daily      to Prevent Heartburn & Indigestion .  PEG-KCl-NaCl-NaSulf-Na Asc-C (PLENVU) 140 g SOLR, Take 140 g by mouth as directed. Manufacturer's coupon - faxed coupon for medicare discount .  tamsulosin (FLOMAX) 0.4 MG CAPS capsule, Take      1 capsule      Daily      for Prostate .  VITAMIN D PO, Take 10,000 Units by mouth 3 (three) times a week.  Allergies: Allergies  Allergen Reactions  . Sulfa Antibiotics Nausea Only    Current Problems (verified) has Hypertension; Hyperlipidemia; Other abnormal glucose; GERD (gastroesophageal reflux disease); Vitamin D deficiency; Medication management; Tobacco abuse; Overweight (BMI 25.0-29.9); BPH associated with nocturia; Positive colorectal cancer screening using Cologuard test; History of cardioembolic cerebrovascular accident (CVA); and Senile purpura ([place]) on their problem list.  Screening Tests Immunization History  Administered Date(s) Administered  . PPD Test 12/13/2013, 12/14/2014, 01/11/2016, 02/28/2017  . Pneumococcal Conjugate-13 08/17/2018  . Pneumococcal Polysaccharide-23 09/28/2019  . Pneumococcal-Unspecified 11/24/2008  . Td 11/11/2004  . Tdap 12/14/2014    Preventative care: Last colonoscopy: 12/2020 CT AB 2003 CXR 2021   Prior vaccinations: TD or Tdap: 2016  Influenza: declines, poor  reaction in the past Pneumococcal: 2010, 2020 Prevnar13: 2019 Shingles/Zostavax: declines  Names of Other Physician/Practitioners you currently use: 1. [facility] here for primary care 2. Dr. Katy Fitch, eye doctor, last visit ?, overdue, discussed 3. Goes as needed, dentist, has partials  Patient Care Team: Unk Pinto, MD as PCP - General (Internal Medicine) Gatha Mayer, MD as Consulting Physician (Gastroenterology)  Surgical: He  has a past surgical history that includes Hernia repair (Right). Family His family history includes Cancer in his mother; Diabetes in his father and sister; Drug abuse in his son; Early death in his daughter; Hyperlipidemia in his mother; Hypertension in his father and mother; Kidney disease in his father; Prostate cancer in his maternal uncle; Stroke in his father. Social history  He reports that he has been smoking cigarettes. He has a 21.00 pack-year smoking history. He has never used smokeless tobacco. He reports that he does not drink alcohol and does not use drugs.  Objective:   Today's Vitals   02/07/21 1136  BP: 130/70  Pulse: (!) 50  Temp: (!) 97.5 F (36.4 C)  SpO2: 99%  Weight: 206 lb (93.4 kg)  Height: 5\' 10"  (1.778 m)   Body mass index is 29.56 kg/m.  General appearance: alert, no distress, WD/WN, male HEENT: normocephalic, sclerae anicteric, TMs pearly, nares patent, no discharge or erythema, pharynx normal Oral cavity: MMM, no lesions Neck: supple, no lymphadenopathy, no thyromegaly, no masses Heart: RRR, normal S1, S2, no murmurs Lungs: CTA bilaterally, no wheezes, rhonchi, or rales Abdomen: +bs, soft, non tender, non distended, no masses, no hepatomegaly, no splenomegaly Musculoskeletal: nontender, no swelling, no obvious deformity Extremities: no edema, no cyanosis, no clubbing Pulses: 2+ symmetric, upper and lower extremities, normal cap refill Neurological: alert, oriented x  3, CN2-12 intact, strength normal upper extremities and lower extremities, sensation normal throughout, DTRs 2+ throughout, no cerebellar signs, gait normal Psychiatric: normal affect, behavior normal, pleasant     Garnet Sierras, Laqueta Jean, DNP [facility] 02/07/2021  12:10 PM

## 2021-02-13 ENCOUNTER — Other Ambulatory Visit: Payer: Self-pay | Admitting: Internal Medicine

## 2021-02-13 DIAGNOSIS — N320 Bladder-neck obstruction: Secondary | ICD-10-CM

## 2021-02-13 DIAGNOSIS — K219 Gastro-esophageal reflux disease without esophagitis: Secondary | ICD-10-CM

## 2021-05-21 ENCOUNTER — Ambulatory Visit: Payer: Medicare Other | Admitting: Internal Medicine

## 2021-05-22 DIAGNOSIS — Z86010 Personal history of colonic polyps: Secondary | ICD-10-CM | POA: Insufficient documentation

## 2021-05-22 DIAGNOSIS — K5790 Diverticulosis of intestine, part unspecified, without perforation or abscess without bleeding: Secondary | ICD-10-CM | POA: Insufficient documentation

## 2021-05-22 NOTE — Progress Notes (Signed)
ANNUAL MEDICARE ANNUAL WELLNESS VISIT AND FOLLOW UP Assessment:   Annual Medicare Wellness Visit Due annually  Health maintenance reviewed - low dose CT scan discussed and ordered   Senile Purpura ([place]) Discussed process, protect skin, sunscreen  Essential hypertension - continue medications, DASH diet, exercise and monitor at home. Call if greater than 130/80.   Other abnormal glucose Recent A1Cs at goal  Discussed disease progression and risks Discussed diet/exercise, weight management and risk modification Check A1C annually  Mixed hyperlipidemia check lipids LDL goal <70 with CVA/smoking  decrease fatty foods increase activity.   Medication management -     CBC with Differential/Platelet -     COMPLETE METABOLIC PANEL WITH GFR  Gastroesophageal reflux disease, esophagitis presence not specified Well managed on current medications Discussed diet, avoiding triggers and other lifestyle changes  Vitamin D deficiency Continue supplement  Overweight (BMI 25.0-29.9) - long discussion about weight loss, diet, and exercise -recommended diet heavy in fruits and veggies and low in animal meats, cheeses, and dairy products  BPH associated with nocturia Continue meds  Tobacco abuse Discussed risks associated with tobacco use and advised to reduce or quit Patient is ready to do so, slowly tapering, declines any medications at this time  Will follow up at the next visit  -lung cancer screening with low dose CT discussed as recommended by guidelines based on age, number of pack year history.  Discussed risks of screening including but not limited to false positives on xray, further testing or consultation with specialist, and possible false negative CT as well. Understanding expressed and wishes to proceed with CT testing. Order placed.    History of CVA Discussed at length; strongly recommend smoking cessation Continue ASA for now, consider plavix - information given  LDL  goal <70; titrate statin accordingly   Diverticulosis Bowel management and high fiber diet encouraged  History of adenomatous polyps Next colonoscopy due 12/2025 per Dr. [name] Recommend avoid red meat/processed, increase fiber   Orders Placed This Encounter  Procedures   CT CHEST LUNG CA SCREEN LOW DOSE W/O CM   CBC with Differential/Platelet   COMPLETE METABOLIC PANEL WITH GFR   Magnesium   Lipid panel   TSH   VITAMIN D 25 Hydroxy (Vit-D Deficiency, Fractures)    Over 30 minutes of exam, counseling, chart review, and critical decision making was performed  Future Appointments  Date Time Provider [place]  11/14/2021 11:00 AM Unk Pinto, MD [facility] None     Plan:   During the course of the visit the patient was educated and counseled about appropriate screening and preventive services including:   Pneumococcal vaccine  Influenza vaccine Prevnar 13 Td vaccine Screening electrocardiogram Colorectal cancer screening Diabetes screening Glaucoma screening Nutrition counseling    Subjective:  Steven Bray. is a 70 y.o. male who presents for Medicare Annual Wellness Visit and 3 month follow up for HTN, hyperlipidemia, prediabetes, and vitamin D Def.   He continues to smoke but slowly reducing, down to <1/2 pack by slowly tapering. He does have 32+ pack year history, had normal CXR 2021 but has never had low dose CT. He is concerned about cost but otherwise receptive.   GERD is well controlled by protonix 40 mg daily.   He had + cologuard in 2019, GI referral was placed but patient was deferring due to cost concerns, did finally have colonoscopy in 12/2020 by Dr. [name] showing adenomatous polyps recommended for 5 year recall.   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 He did have CT head 03/2020 showing chronic lacunar infarctions, plavix was recommended and neuro referral placed but patient never followed through. He continues on ASA 81 mg x 2 daily. Declined plavix or referral today.   His blood pressure has been controlled at home ,has had HTN x 2002, today their BP is BP: 98/68.  He does not workout. He denies chest pain, shortness of breath, dizziness.   He is on cholesterol medication (atorvastatin 40 mg MWF) and denies myalgias. His cholesterol is not at goal less than 70. The cholesterol last visit was:   Lab Results  Component Value Date   CHOL 145 02/07/2021   HDL 32 (L) 02/07/2021   LDLCALC 83 02/07/2021   TRIG 209 (H) 02/07/2021   CHOLHDL 4.5 02/07/2021   He has been working on diet and exercise for hx of prediabetes (5.7 in 2013), and denies paresthesia of the feet, polydipsia and polyuria. Last A1C in the office was:  Lab Results  Component Value Date   HGBA1C 5.4 10/26/2020   Last GFR Lab Results  Component Value Date   GFRNONAA 76 02/07/2021    Patient is on Vitamin D supplement, was taking 50000 IU weekly, now taking 10000 IU three days/week Lab Results  Component Value Date   VD25OH 78 10/26/2020       Medication Review:   Current Outpatient Medications (Cardiovascular):    atenolol (TENORMIN) 25 MG tablet, TAKE 1 TABLET BY MOUTH  TWICE DAILY FOR BLOOD  PRESSURE   atorvastatin (LIPITOR) 40 MG tablet, TAKE 1 TABLET BY MOUTH BY  MOUTH 3 TIMES /WEEK ON  MONDAY WEDNESDAY FRIDAY FOR CHOLESTEROL   tadalafil (CIALIS) 20 MG tablet, Take 1/2 to 1 tablet every 2 to 3 days as needed for XXXX   hydrochlorothiazide (MICROZIDE) 12.5 MG capsule, TAKE 1 CAPSULE BY MOUTH  DAILY FOR BLOOD PRESSURE  AND FLUID RETENTION/ANKLE  SWELLING   Current Outpatient Medications (Analgesics):    aspirin EC 81 MG tablet, Take  162 mg by mouth daily.   meloxicam (MOBIC) 15 MG tablet, TAKE 1/2 TO 1 TABLET BY  MOUTH DAILY WITH FOOD FOR  PAIN AND INFLAMMATION (Patient taking differently: TAKE 1/2 TO 1 TABLET BY  MOUTH MON, WED, FRI WITH FOOD FOR PAIN AND INFLAMMATION)  Current Outpatient Medications (Hematological):    Cyanocobalamin (VITAMIN B 12) 500 MCG TABS, Take 1 tablet by mouth daily.  Current Outpatient Medications (Other):    pantoprazole (PROTONIX) 40 MG tablet, TAKE 1 TABLET BY MOUTH  DAILY TO PREVENT HEARTBURN  &amp; INDIGESTION   tamsulosin (FLOMAX) 0.4 MG CAPS capsule, TAKE 1 CAPSULE BY MOUTH  DAILY FOR PROSTATE   VITAMIN D PO, Take 10,000 Units by mouth 3 (three) times a week.  Allergies: Allergies  Allergen Reactions   Sulfa Antibiotics Nausea Only    Current Problems (verified) has Hypertension; Hyperlipidemia; Other abnormal glucose; GERD (gastroesophageal reflux disease); Vitamin D deficiency; Medication management; Tobacco abuse; Overweight (BMI 25.0-29.9); BPH associated with nocturia; History of cardioembolic cerebrovascular accident (CVA); Senile purpura ([place]); History of adenomatous polyp of colon; and Diverticulosis on their problem list.  Screening Tests Immunization  History  Administered Date(s) Administered   PPD Test 12/13/2013, 12/14/2014, 01/11/2016, 02/28/2017   Pneumococcal Conjugate-13 08/17/2018   Pneumococcal Polysaccharide-23 09/28/2019   Pneumococcal-Unspecified 11/24/2008   Td 11/11/2004   Tdap 12/14/2014    Preventative care: Last colonoscopy: colonoscopy 12/2020 with adenomatous polyps, 5 year recall Dr. [name]  Prior vaccinations: TD or Tdap: 2016  Influenza: declines, poor reaction in the past Pneumococcal: 2010, 2020 Prevnar13: 2019 Shingles/Zostavax: declines Covid 19: Declines  Names of Other Physician/Practitioners you currently use: 1. [facility] here for primary care 2. Dr. Katy Fitch, eye doctor, last visit, overdue,  encouraged to schedule 3. Goes as needed, dentist, last ?, has partials, minimal teeth  Patient Care Team: Unk Pinto, MD as PCP - General (Internal Medicine) [name], Kirke Corin, MD as Consulting Physician (Gastroenterology)  Surgical: He  has a past surgical history that includes Hernia repair ([place]). Family His family history includes Cancer in his mother; Diabetes in his father and sister; Drug abuse in his son; Early death in his daughter; Hyperlipidemia in his mother; Hypertension in his father and mother; Kidney disease in his father; Prostate cancer in his maternal uncle; Stroke in his father. Social history  He reports that he has been smoking cigarettes. He started smoking about 52 years ago. He has a 33.80 pack-year smoking history. He has never used smokeless tobacco. He reports that he does not drink alcohol and does not use drugs.  MEDICARE WELLNESS OBJECTIVES: Physical activity: Current Exercise Habits: The patient does not participate in regular exercise at present, Exercise limited by: None identified Cardiac risk factors: Cardiac Risk Factors include: advanced age (>71men, >61 women);male gender;hypertension;dyslipidemia;smoking/ tobacco exposure;sedentary lifestyle Depression/mood screen:   Depression screen [facility] 2/9 05/23/2021  Decreased Interest 0  Down, Depressed, Hopeless 0  PHQ - 2 Score 0    ADLs:  In your present state of health, do you have any difficulty performing the following activities: 05/23/2021 10/26/2020  Hearing? N N  Vision? N N  Difficulty concentrating or making decisions? N N  Walking or climbing stairs? N N  Dressing or bathing? N N  Doing errands, shopping? N N  Some recent data might be hidden     Cognitive Testing  Alert? Yes  Normal Appearance?Yes  Oriented to person? Yes  Place? Yes   Time? Yes  Recall of three objects?  Yes  Can perform simple calculations? Yes  Displays appropriate judgment?Yes  Can read the correct time  from a watch face?Yes  EOL planning: Does Patient Have a Medical Advance Directive?: No Would patient like information on creating a medical advance directive?: Yes (MAU/Ambulatory/Procedural Areas - Information given)     Objective:   Today's Vitals   05/23/21 1456  BP: 98/68  Pulse: 64  Temp: (!) 96.8 F (36 C)  SpO2: 96%  Weight: 206 lb (93.4 kg)    Body mass index is 29.56 kg/m.  General appearance: alert, no distress, WD/WN, male HEENT: normocephalic, sclerae anicteric, TMs pearly, nares patent, no discharge or erythema, pharynx normal Oral cavity: MMM, no lesions Neck: supple, no lymphadenopathy, no thyromegaly, no masses Heart: RRR, normal S1, S2, no murmurs Lungs: CTA bilaterally, no wheezes, rhonchi, or rales Abdomen: +bs, soft, non tender, non distended, no masses, no hepatomegaly, no splenomegaly Musculoskeletal: nontender, no swelling, no obvious deformity Extremities: no edema, no cyanosis, no clubbing Pulses: 2+ symmetric, upper and lower extremities, normal cap refill Neurological: alert, oriented x 3, CN2-12 intact, strength normal upper extremities and lower  extremities, sensation normal throughout, DTRs 2+ throughout, no cerebellar signs, gait normal Psychiatric: normal affect, behavior normal, pleasant  Skin: warm/dry, no rashes or lesions. Scattered ecchymosis to extremities   Medicare Attestation I have personally reviewed: The patient's medical and social history Their use of alcohol, tobacco or illicit drugs Their current medications and supplements The patient's functional ability including ADLs,fall risks, home safety risks, cognitive, and hearing and visual impairment Diet and physical activities Evidence for depression or mood disorders  The patient's weight, height, BMI, and visual acuity have been recorded in the chart.  I have made referrals, counseling, and provided education to the patient based on review of the above and I have provided  the patient with a written personalized care plan for preventive services.     Izora Ribas, NP   05/23/2021

## 2021-05-23 ENCOUNTER — Encounter: Payer: Self-pay | Admitting: Adult Health

## 2021-05-23 ENCOUNTER — Ambulatory Visit (INDEPENDENT_AMBULATORY_CARE_PROVIDER_SITE_OTHER): Payer: Medicare Other | Admitting: Adult Health

## 2021-05-23 ENCOUNTER — Other Ambulatory Visit: Payer: Self-pay

## 2021-05-23 VITALS — BP 98/68 | HR 64 | Temp 96.8°F | Wt 206.0 lb

## 2021-05-23 DIAGNOSIS — R7309 Other abnormal glucose: Secondary | ICD-10-CM

## 2021-05-23 DIAGNOSIS — D692 Other nonthrombocytopenic purpura: Secondary | ICD-10-CM | POA: Diagnosis not present

## 2021-05-23 DIAGNOSIS — Z0000 Encounter for general adult medical examination without abnormal findings: Secondary | ICD-10-CM

## 2021-05-23 DIAGNOSIS — N401 Enlarged prostate with lower urinary tract symptoms: Secondary | ICD-10-CM | POA: Diagnosis not present

## 2021-05-23 DIAGNOSIS — Z8673 Personal history of transient ischemic attack (TIA), and cerebral infarction without residual deficits: Secondary | ICD-10-CM | POA: Diagnosis not present

## 2021-05-23 DIAGNOSIS — Z0001 Encounter for general adult medical examination with abnormal findings: Secondary | ICD-10-CM | POA: Diagnosis not present

## 2021-05-23 DIAGNOSIS — R351 Nocturia: Secondary | ICD-10-CM

## 2021-05-23 DIAGNOSIS — R6889 Other general symptoms and signs: Secondary | ICD-10-CM | POA: Diagnosis not present

## 2021-05-23 DIAGNOSIS — Z79899 Other long term (current) drug therapy: Secondary | ICD-10-CM

## 2021-05-23 DIAGNOSIS — E559 Vitamin D deficiency, unspecified: Secondary | ICD-10-CM | POA: Diagnosis not present

## 2021-05-23 DIAGNOSIS — Z86010 Personal history of colonic polyps: Secondary | ICD-10-CM

## 2021-05-23 DIAGNOSIS — I10 Essential (primary) hypertension: Secondary | ICD-10-CM | POA: Diagnosis not present

## 2021-05-23 DIAGNOSIS — K5790 Diverticulosis of intestine, part unspecified, without perforation or abscess without bleeding: Secondary | ICD-10-CM

## 2021-05-23 DIAGNOSIS — K219 Gastro-esophageal reflux disease without esophagitis: Secondary | ICD-10-CM

## 2021-05-23 DIAGNOSIS — Z122 Encounter for screening for malignant neoplasm of respiratory organs: Secondary | ICD-10-CM

## 2021-05-23 DIAGNOSIS — E782 Mixed hyperlipidemia: Secondary | ICD-10-CM | POA: Diagnosis not present

## 2021-05-23 DIAGNOSIS — E663 Overweight: Secondary | ICD-10-CM

## 2021-05-23 DIAGNOSIS — Z720 Tobacco use: Secondary | ICD-10-CM

## 2021-05-23 NOTE — Patient Instructions (Addendum)
Goals      Exercise 150 min/wk Moderate Activity     LDL CALC < 70     Quit Smoking        Due to smoking and stroke history, recommend trying to maintain cholesterol LDL <70 to minimize risk of further strokes or new heart attack - lower is better such as 50  Try to avoid animal fats/fried foods  Eat more beans, nuts, whole grains, fresh fruits and veggies which help reduce risk of heart and stroke problems   Clopidogrel (plavix) Tablets What is this medication? CLOPIDOGREL (kloh PID oh grel) lowers the risk of heart attack, stroke, or blood clots. It prevents blood cells (platelets) from clumping together to forma clot. It belongs to a group of medications called antiplatelets. This medicine may be used for other purposes; ask your health care provider orpharmacist if you have questions. COMMON BRAND NAME(S): Plavix What should I tell my care team before I take this medication? They need to know if you have any of the following conditions: Bleeding disorders Bleeding in the brain Having surgery History of stomach bleeding An unusual or allergic reaction to clopidogrel, other medications, foods, dyes, or preservatives Pregnant or trying to get pregnant Breast-feeding How should I use this medication? Take this medication by mouth with a glass of water. Follow the directions on the prescription label. You may take this medication with or without food. If it upsets your stomach, take it with food. Take your medication at regular intervals. Do not take it more often than directed. Do not stop taking excepton your care team's advice. A special MedGuide will be given to you by the pharmacist with eachprescription and refill. Be sure to read this information carefully each time. Talk to your care team about the use of this medication in children. Specialcare may be needed. Overdosage: If you think you have taken too much of this medicine contact apoison control center or emergency room  at once. NOTE: This medicine is only for you. Do not share this medicine with others. What if I miss a dose? If you miss a dose, take it as soon as you can. If it is almost time for yournext dose, take only that dose. Do not take double or extra doses. What may interact with this medication? Do not take this medication with the following: Dasabuvir; ombitasvir; paritaprevir; ritonavir Defibrotide Selexipag This medication may also interact with the following: Certain medications that treat or prevent blood clots like warfarin Narcotic medications for pain NSAIDs, medications for pain and inflammation, like ibuprofen or naproxen Repaglinide SNRIs, medications for depression, like desvenlafaxine, duloxetine, levomilnacipran, venlafaxine SSRIs, medications for depression, like citalopram, escitalopram, fluoxetine, fluvoxamine, paroxetine, sertraline Stomach acid blockers like cimetidine, esomeprazole, omeprazole This list may not describe all possible interactions. Give your health care provider a list of all the medicines, herbs, non-prescription drugs, or dietary supplements you use. Also tell them if you smoke, drink alcohol, or use illegaldrugs. Some items may interact with your medicine. What should I watch for while using this medication? Visit your care team for regular check-ups. Do not stop taking your medicationunless your care team tells you to. Notify your care team and seek emergency services if you develop sudden numbness or weakness of the face, arm, or leg, trouble speaking, confusion, trouble walking, loss of balance or coordination, dizziness, severe headache,or change in vision. These can be signs that your condition has gotten worse. If you are going to have surgery or dental work, tell  your care team that youare taking this medication. Certain genetic factors may reduce the effect of this medication. Your careteam may use genetic tests to determine treatment. Only take  aspirin if you are instructed to. Low doses of aspirin are used with this medication to treat some conditions. Taking aspirin with this medication can increase your risk of bleeding, so you must be careful. Talk to your careteam if you have questions. What side effects may I notice from receiving this medication? Side effects that you should report to your care team as soon as possible: Allergic reactions-skin rash, itching, hives, swelling of the face, lips, tongue, or throat Bleeding-bloody or black, tar-like stools, red or dark brown urine, vomiting blood or brown material that looks like coffee grounds, small, red or purple spots on the skin, unusual bleeding or bruising TTP-purple spots on the skin or inside the mouth, pale skin, yellowing skin or eyes, unusual weakness or fatigue, fever, fast or irregular heartbeat, confusion, change in vision, trouble speaking, trouble walking Side effects that usually do not require medical attention (report to your careteam if they continue or are bothersome): Diarrhea Headache This list may not describe all possible side effects. Call your doctor for medical advice about side effects. You may report side effects to FDA at1-800-FDA-1088. Where should I keep my medication? Keep out of the reach of children and pets. Store at room temperature of 59 to 86 degrees F (15 to 30 degrees C). Throwaway any unused medication after the expiration date. NOTE: This sheet is a summary. It may not cover all possible information. If you have questions about this medicine, talk to your doctor, pharmacist, orhealth care provider.  2022 Elsevier/Gold Standard (2020-09-29 15:33:52)

## 2021-05-24 ENCOUNTER — Other Ambulatory Visit: Payer: Self-pay | Admitting: Adult Health

## 2021-05-24 LAB — COMPLETE METABOLIC PANEL WITH GFR
AG Ratio: 1.5 (calc) (ref 1.0–2.5)
ALT: 10 U/L (ref 9–46)
AST: 13 U/L (ref 10–35)
Albumin: 4.3 g/dL (ref 3.6–5.1)
Alkaline phosphatase (APISO): 91 U/L (ref 35–144)
BUN: 20 mg/dL (ref 7–25)
CO2: 29 mmol/L (ref 20–32)
Calcium: 9.6 mg/dL (ref 8.6–10.3)
Chloride: 102 mmol/L (ref 98–110)
Creat: 1.15 mg/dL (ref 0.70–1.35)
Globulin: 2.9 g/dL (calc) (ref 1.9–3.7)
Glucose, Bld: 83 mg/dL (ref 65–99)
Potassium: 3.9 mmol/L (ref 3.5–5.3)
Sodium: 139 mmol/L (ref 135–146)
Total Bilirubin: 0.9 mg/dL (ref 0.2–1.2)
Total Protein: 7.2 g/dL (ref 6.1–8.1)
eGFR: 70 mL/min/{1.73_m2} (ref >=60)

## 2021-05-24 LAB — CBC WITH DIFFERENTIAL/PLATELET
Absolute Monocytes: 531 cells/uL (ref 200–950)
Basophils Absolute: 61 cells/uL (ref 0–200)
Basophils Relative: 1 %
Eosinophils Absolute: 98 cells/uL (ref 15–500)
Eosinophils Relative: 1.6 %
HCT: 49.9 % (ref 38.5–50.0)
Hemoglobin: 17 g/dL (ref 13.2–17.1)
Lymphs Abs: 1244 cells/uL (ref 850–3900)
MCH: 31.8 pg (ref 27.0–33.0)
MCHC: 34.1 g/dL (ref 32.0–36.0)
MCV: 93.3 fL (ref 80.0–100.0)
MPV: 10.5 fL (ref 7.5–12.5)
Monocytes Relative: 8.7 %
Neutro Abs: 4166 cells/uL (ref 1500–7800)
Neutrophils Relative %: 68.3 %
Platelets: 232 10*3/uL (ref 140–400)
RBC: 5.35 10*6/uL (ref 4.20–5.80)
RDW: 12.4 % (ref 11.0–15.0)
Total Lymphocyte: 20.4 %
WBC: 6.1 10*3/uL (ref 3.8–10.8)

## 2021-05-24 LAB — VITAMIN D 25 HYDROXY (VIT D DEFICIENCY, FRACTURES): Vit D, 25-Hydroxy: 84 ng/mL (ref 30–100)

## 2021-05-24 LAB — MAGNESIUM: Magnesium: 2 mg/dL (ref 1.5–2.5)

## 2021-05-24 LAB — LIPID PANEL
Cholesterol: 149 mg/dL (ref <200)
HDL: 31 mg/dL — ABNORMAL LOW (ref >=40)
LDL Cholesterol (Calc): 90 mg/dL (calc)
Non-HDL Cholesterol (Calc): 118 mg/dL (calc) (ref <130)
Total CHOL/HDL Ratio: 4.8 (calc) (ref <5.0)
Triglycerides: 186 mg/dL — ABNORMAL HIGH (ref <150)

## 2021-05-24 LAB — TSH: TSH: 1.25 mIU/L (ref 0.40–4.50)

## 2021-05-24 MED ORDER — ATORVASTATIN CALCIUM 40 MG PO TABS: ORAL_TABLET | ORAL | 3 refills | Status: DC

## 2021-05-25 NOTE — Progress Notes (Signed)
Patient is aware of lab results and Atorvastatin instructions. -e welch

## 2021-07-31 IMAGING — CR DG CHEST 2V
2 series · 2 of 2 positions shown · non-contrast
Comparison: 08/16/2013

CLINICAL DATA: Smoker, hypertension.  Screening

EXAM:
CHEST - 2 VIEW

[w chest pa]
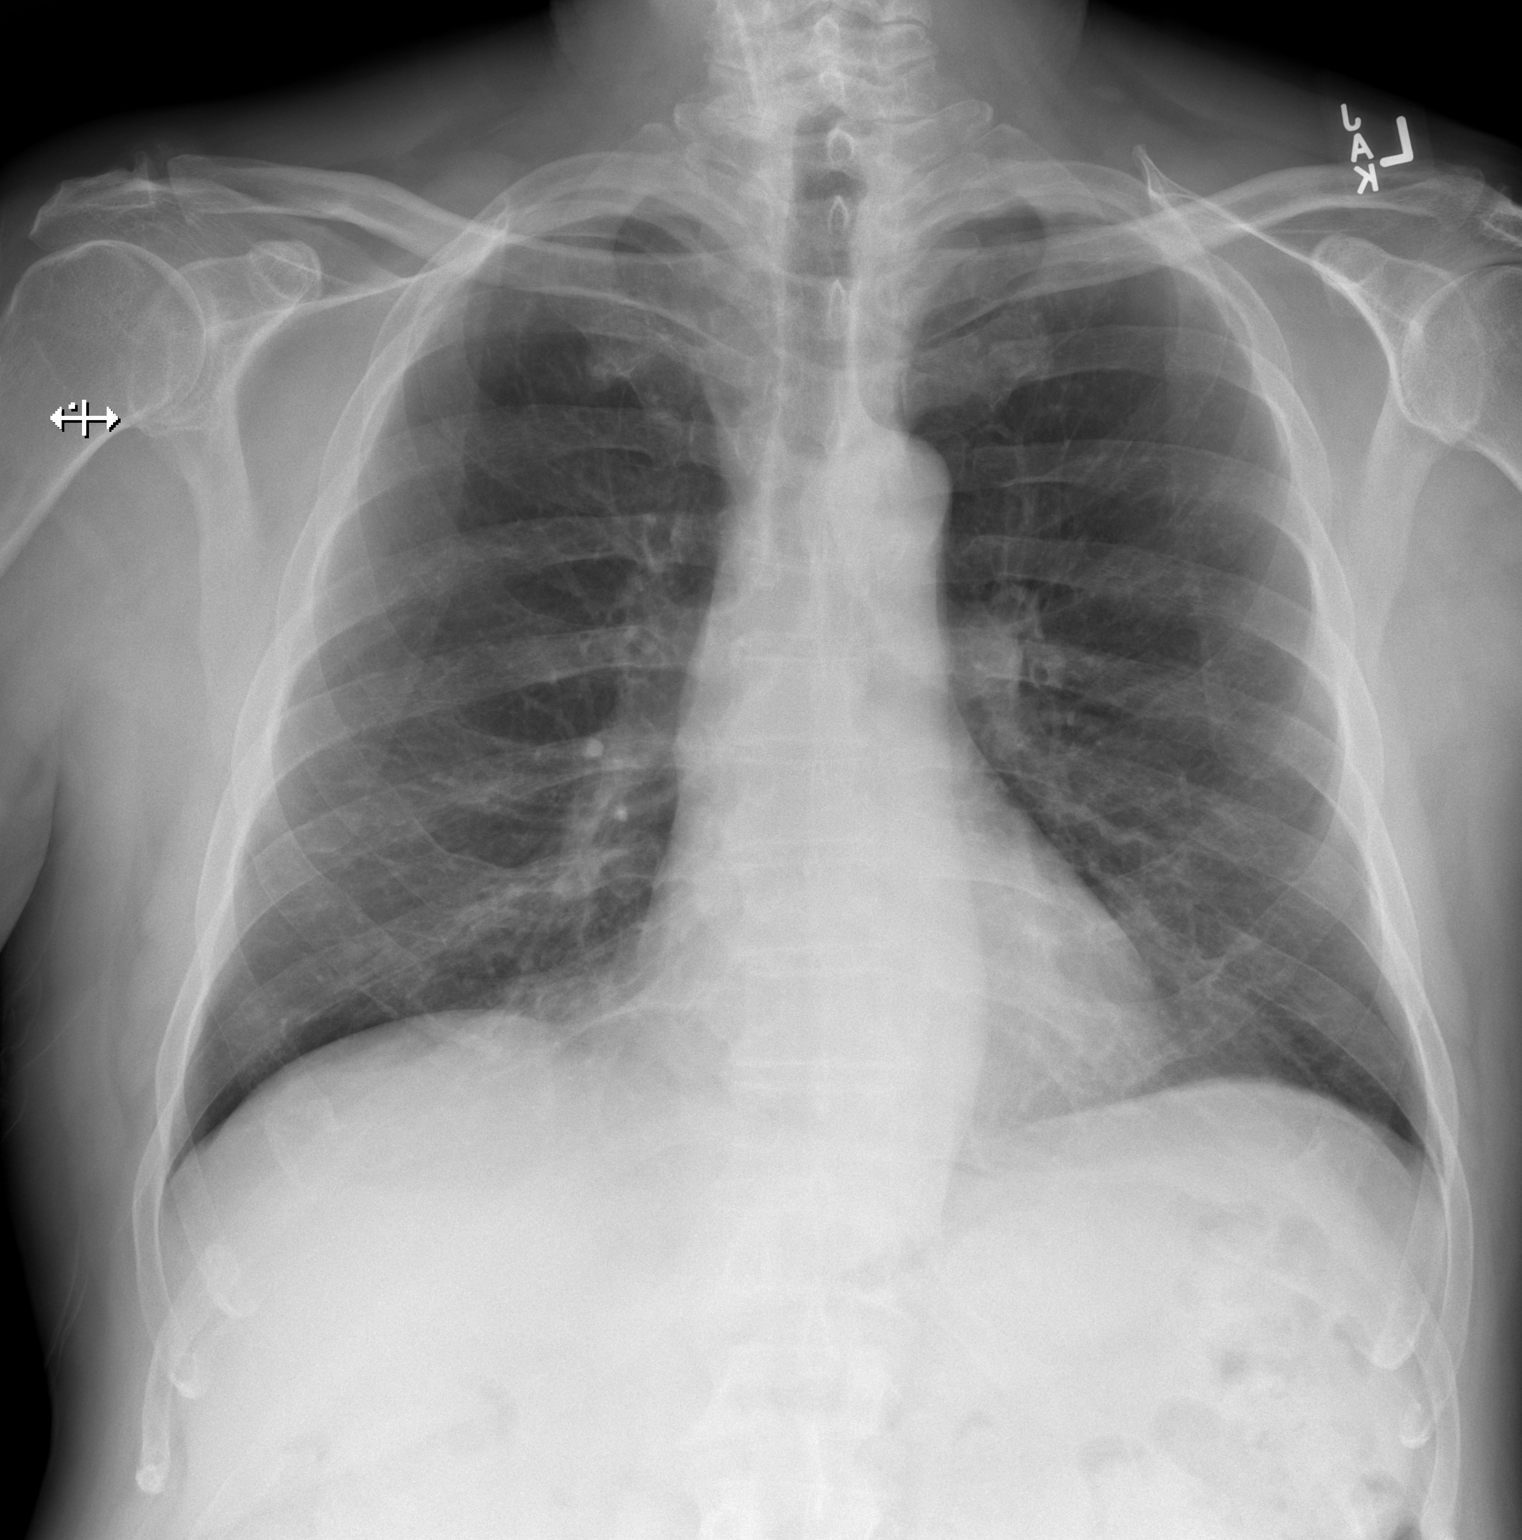

[w chest lat]
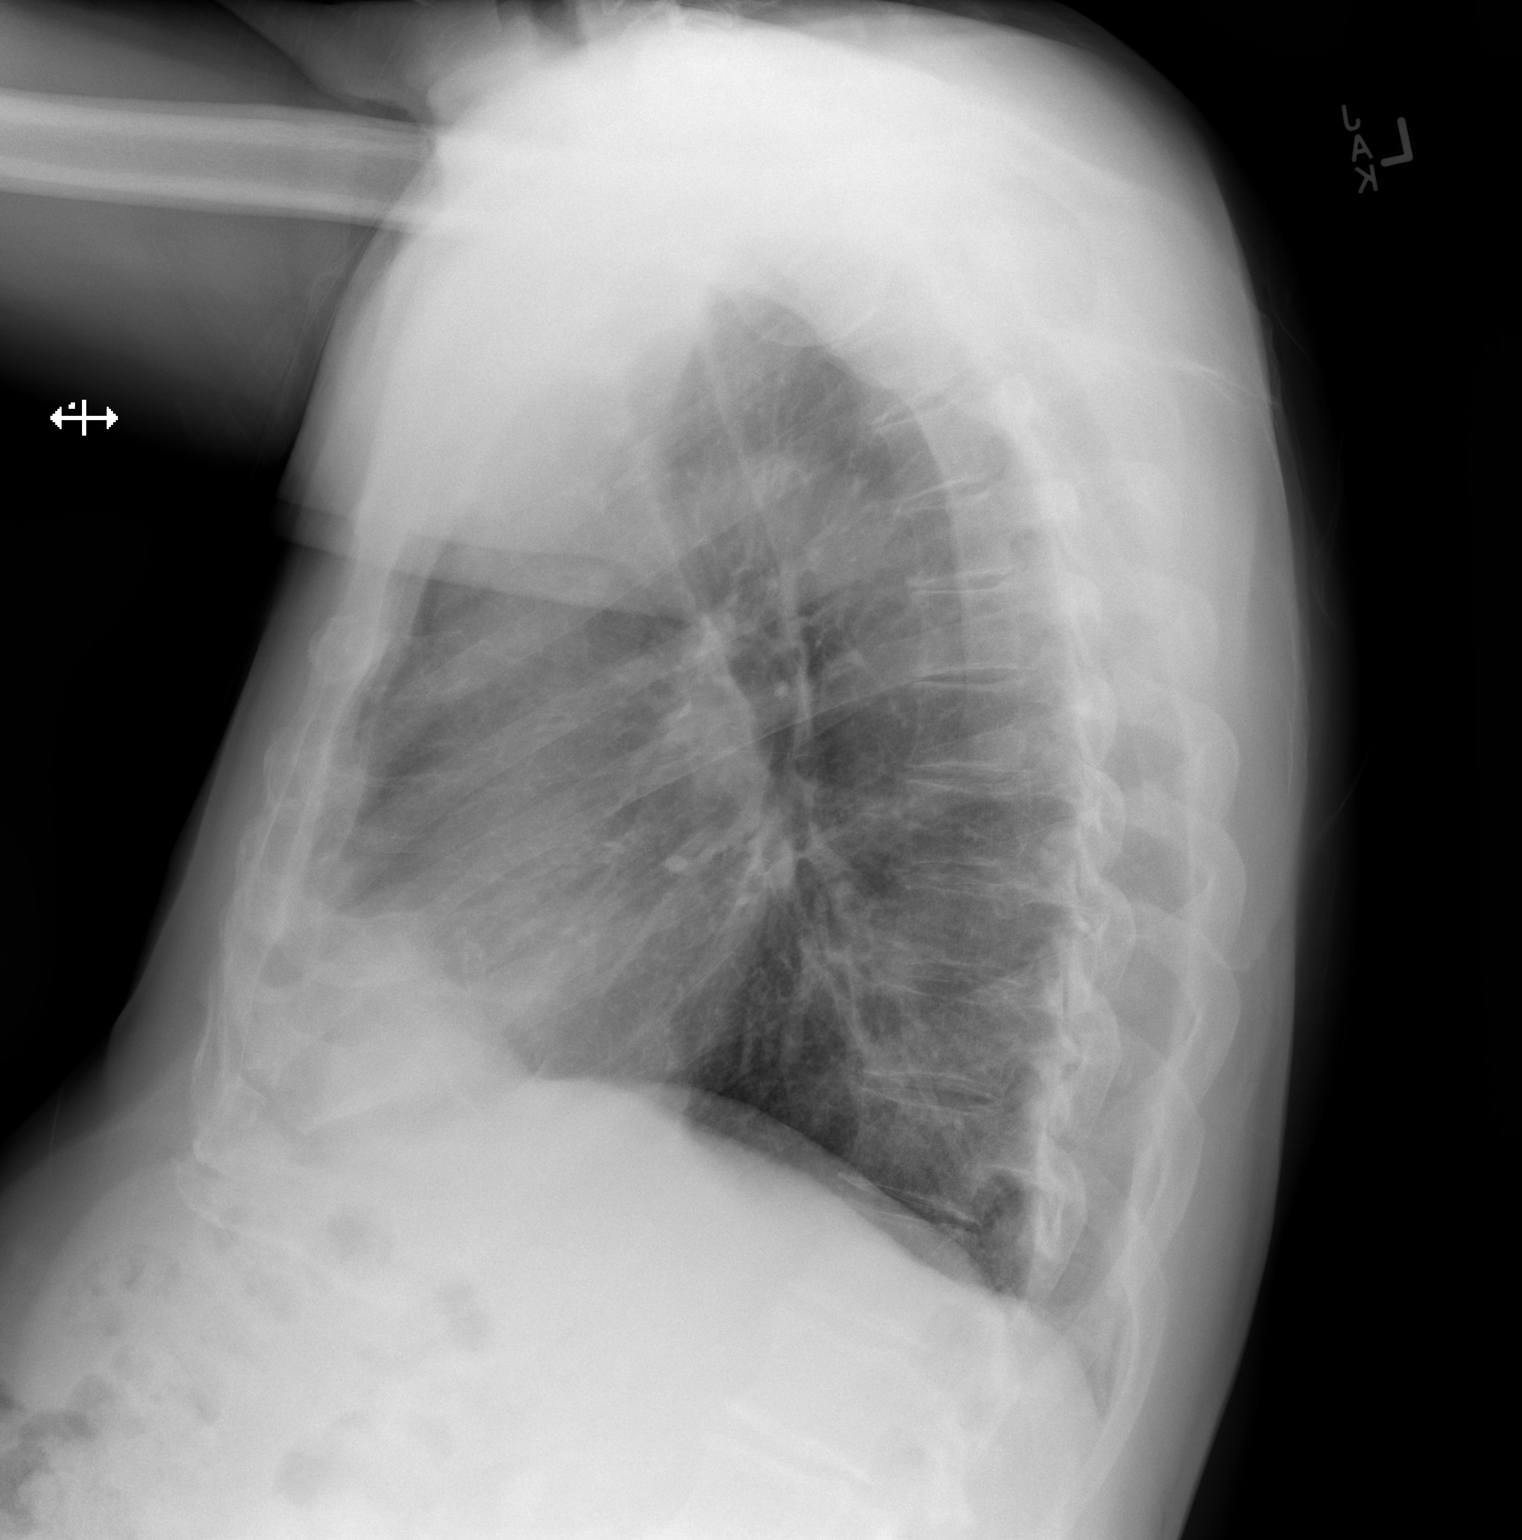

[2 of 2 positions shown; findings below may reference images not displayed]

FINDINGS: The heart size and mediastinal contours are within normal limits.
Both lungs are clear. The visualized skeletal structures are
unremarkable.
IMPRESSION: No active cardiopulmonary disease.

## 2021-09-28 ENCOUNTER — Other Ambulatory Visit: Payer: Self-pay | Admitting: Adult Health

## 2021-09-28 DIAGNOSIS — I10 Essential (primary) hypertension: Secondary | ICD-10-CM

## 2021-11-07 NOTE — Patient Instructions (Addendum)
Due to recent changes in healthcare laws, you may see the results of your imaging and laboratory studies on MyChart before your provider has had a chance to review them.  We understand that in some cases there may be results that are confusing or concerning to you. Not all laboratory results come back in the same time frame and the provider may be waiting for multiple results in order to interpret others.  Please give [place] 48 hours in order for your provider to thoroughly review all the results before contacting the office for clarification of your results.  ++++++++++++++++++++++++++++++++++ Edema Edema is an abnormal buildup of fluids in the body tissues and under the skin. Swelling of the legs, feet, and ankles is a common symptom that becomes more likely as you get older. Swelling is also common in looser tissues, like around the eyes. When the affected area is squeezed, the fluid may move out of that spot and leave a dent for a few moments. This dent is called pitting edema. There are many possible causes of edema. Eating too much salt (sodium) and being on your feet or sitting for a long time can cause edema in your legs, feet, and ankles. Hot weather may make edema worse. Common causes of edema include: Heart failure. Liver or kidney disease. Weak leg blood vessels. Cancer. An injury. Pregnancy. Medicines. Being obese. Low protein levels in the blood. Edema is usually painless. Your skin may look swollen or shiny. Follow these instructions at home: Keep the affected body part raised (elevated) above the level of your heart when you are sitting or lying down. Do not sit still or stand for long periods of time. Do not wear tight clothing. Do not wear garters on your upper legs. Exercise your legs to get your circulation going. This helps to move the fluid back into your blood vessels, and it may help the swelling go down. Wear elastic bandages or support stockings to reduce swelling as told  by your health care provider. Eat a low-salt (low-sodium) diet to reduce fluid as told by your health care provider. Depending on the cause of your swelling, you may need to limit how much fluid you drink (fluid restriction). Take over-the-counter and prescription medicines only as told by your health care provider. Contact a health care provider if: Your edema does not get better with treatment. You have heart, liver, or kidney disease and have symptoms of edema. You have sudden and unexplained weight gain. Get help right away if: You develop shortness of breath or chest pain. You cannot breathe when you lie down. You develop pain, redness, or warmth in the swollen areas. You have heart, liver, or kidney disease and suddenly get edema. You have a fever and your symptoms suddenly get worse. Summary Edema is an abnormal buildup of fluids in the body tissues and under the skin. Eating too much salt (sodium) and being on your feet or sitting for a long time can cause edema in your legs, feet, and ankles. Keep the affected body part raised (elevated) above the level of your heart when you are sitting or lying down. This information is not intended to replace advice given to you by your health care provider. Make sure you discuss any questions you have with your health care provider. Document Revised: 03/29/2021 Document Reviewed: 08/22/2020 Elsevier Patient Education  [place].    ++++++++++++++++++++++++++++++++++  Vit D  & Vit C 1,000 mg   are recommended to help protect  against the Covid-19 and other Corona viruses.    Also it's recommended  to take  Zinc 50 mg  to help  protect against the Covid-19   and best place to get  is also on [email or web address]  and don't pay more than 6-8 cents /pill !   ===================================== Coronavirus (COVID-19) Are you at risk?  Are you at risk for the Coronavirus (COVID-19)?  To be considered HIGH RISK for Coronavirus  (COVID-19), you have to meet the following criteria:  Traveled to [place], [place], [place], [place] or [place]; or in the [place] to [place], [place], [place]  or [place]; and have fever, cough, and shortness of breath within the last 2 weeks of travel OR Been in close contact with a person diagnosed with COVID-19 within the last 2 weeks and have  fever, cough,and shortness of breath  IF YOU DO NOT MEET THESE CRITERIA, YOU ARE CONSIDERED LOW RISK FOR COVID-19.  What to do if you are HIGH RISK for COVID-19?  If you are having a medical emergency, call 911. Seek medical care right away. Before you go to a doctors office, urgent care or emergency department,  call ahead and tell them about your recent travel, contact with someone diagnosed with COVID-19   and your symptoms.  You should receive instructions from your physicians office regarding next steps of care.  When you arrive at healthcare provider, tell the healthcare staff immediately you have returned from  visiting [place], [place], [place], [place] or [place]; or traveled in the [place] to [place], [place],  [place] or [place] in the last two weeks or you have been in close contact with a person diagnosed with  COVID-19 in the last 2 weeks.   Tell the health care staff about your symptoms: fever, cough and shortness of breath. After you have been seen by a medical provider, you will be either: Tested for (COVID-19) and discharged home on quarantine except to seek medical care if  symptoms worsen, and asked to  Stay home and avoid contact with others until you get your results (4-5 days)  Avoid travel on public transportation if possible (such as bus, train, or airplane) or Sent to the Emergency Department by EMS for evaluation, COVID-19 testing  and  possible admission depending on your condition and test results.  What to do if you are LOW RISK for COVID-19?  Reduce your risk of any infection by using  the same precautions used for avoiding the common cold or flu:  Wash your hands often with soap and warm water for at least 20 seconds.  If soap and water are not readily available,  use an alcohol-based hand sanitizer with at least 60% alcohol.  If coughing or sneezing, cover your mouth and nose by coughing or sneezing into the elbow areas of your shirt or coat,  into a tissue or into your sleeve (not your hands). Avoid shaking hands with others and consider head nods or verbal greetings only. Avoid touching your eyes, nose, or mouth with unwashed hands.  Avoid close contact with people who are sick. Avoid places or events with large numbers of people in one location, like concerts or sporting events. Carefully consider travel plans you have or are making. If you are planning any travel outside or inside the [place], visit the [place] webpage for the latest health notices. If you have some symptoms but not all symptoms, continue to monitor at home and  seek medical attention  if your symptoms worsen. If you are having a medical emergency, call 911.   ++++++++++++++++++++++++++++++++ Recommend Adult Low Dose Aspirin or  coated  Aspirin 81 mg daily  To reduce risk of Colon Cancer 40 %,  Skin Cancer 26 % ,  Melanoma 46%  and  Pancreatic cancer 60% ++++++++++++++++++++++++++++++++ Vitamin D goal  is between 70-100.  Please make sure that you are taking your Vitamin D as directed.  It is very important as a natural anti-inflammatory  helping hair, skin, and nails, as well as reducing stroke and heart attack risk.  It helps your bones and helps with mood. It also decreases numerous cancer risks so please take it as directed.  Low Vit D is associated with a 200-300% higher risk for CANCER  and 200-300% higher risk for HEART   ATTACK  &  STROKE.   .....................................Marland Kitchen It is also associated with higher death rate at younger ages,  autoimmune diseases like  Rheumatoid arthritis, Lupus, Multiple Sclerosis.    Also many other serious conditions, like depression, Alzheimer's Dementia, infertility, muscle aches, fatigue, fibromyalgia - just to name a few. ++++++++++++++++++++ Recommend the book "The END of DIETING" by Dr Excell Seltzer  & the book "The END of DIABETES " by Dr Excell Seltzer At [email or web address] - get book & Audio CD's    Being diabetic has a  300% increased risk for heart attack, stroke, cancer, and alzheimer- type vascular dementia. It is very important that you work harder with diet by avoiding all foods that are white. Avoid white rice (brown & wild rice is OK), white potatoes (sweetpotatoes in moderation is OK), White bread or wheat bread or anything made out of white flour like bagels, donuts, rolls, buns, biscuits, cakes, pastries, cookies, pizza crust, and pasta (made from white flour & egg whites) - vegetarian pasta or spinach or wheat pasta is OK. Multigrain breads like Arnold's or [employer], or multigrain sandwich thins or flatbreads.  Diet, exercise and weight loss can reverse and cure diabetes in the early stages.  Diet, exercise and weight loss is very important in the control and prevention of complications of diabetes which affects every system in your body, ie. Brain - dementia/stroke, eyes - glaucoma/blindness, heart - heart attack/heart failure, kidneys - dialysis, stomach - gastric paralysis, intestines - malabsorption, nerves - severe painful neuritis, circulation - gangrene & loss of a leg(s), and finally cancer and Alzheimers.    I recommend avoid fried & greasy foods,  sweets/candy, white rice (brown or wild rice or Quinoa is OK), white potatoes (sweet potatoes are OK) - anything made from white flour - bagels, doughnuts, rolls, buns, biscuits,white and wheat breads, pizza crust and traditional pasta made of white flour & egg white(vegetarian pasta or spinach or wheat pasta is OK).  Multi-grain bread is OK - like multi-grain flat  bread or sandwich thins. Avoid alcohol in excess. Exercise is also important.    Eat all the vegetables you want - avoid meat, especially red meat and dairy - especially cheese.  Cheese is the most concentrated form of trans-fats which is the worst thing to clog up our arteries. Veggie cheese is OK which can be found in the fresh produce section at [employer] or [employer] or [employer]  +++++++++++++++++++++ DASH Eating Plan  DASH stands for "Dietary Approaches to Stop Hypertension."   The DASH eating plan is a healthy eating plan that has been shown to reduce high blood pressure (hypertension). Additional health  benefits may include reducing the risk of type 2 diabetes mellitus, heart disease, and stroke. The DASH eating plan may also help with weight loss. WHAT DO I NEED TO KNOW ABOUT THE DASH EATING PLAN? For the DASH eating plan, you will follow these general guidelines: Choose foods with a percent daily value for sodium of less than 5% (as listed on the food label). Use salt-free seasonings or herbs instead of table salt or sea salt. Check with your health care provider or pharmacist before using salt substitutes. Eat lower-sodium products, often labeled as "lower sodium" or "no salt added." Eat fresh foods. Eat more vegetables, fruits, and low-fat dairy products. Choose whole grains. Look for the word "whole" as the first word in the ingredient list. Choose fish  Limit sweets, desserts, sugars, and sugary drinks. Choose heart-healthy fats. Eat veggie cheese  Eat more home-cooked food and less restaurant, buffet, and fast food. Limit fried foods. Cook foods using methods other than frying. Limit canned vegetables. If you do use them, rinse them well to decrease the sodium. When eating at a restaurant, ask that your food be prepared with less salt, or no salt if possible.                      WHAT FOODS CAN I EAT? Read Dr Fara Olden Fuhrman's books on The End of Dieting & The End of  Diabetes  Grains Whole grain or whole wheat bread. Brown rice. Whole grain or whole wheat pasta. Quinoa, bulgur, and whole grain cereals. Low-sodium cereals. Corn or whole wheat flour tortillas. Whole grain cornbread. Whole grain crackers. Low-sodium crackers.  Vegetables Fresh or frozen vegetables (raw, steamed, roasted, or grilled). Low-sodium or reduced-sodium tomato and vegetable juices. Low-sodium or reduced-sodium tomato sauce and paste. Low-sodium or reduced-sodium canned vegetables.   Fruits All fresh, canned (in natural juice), or frozen fruits.  Protein Products  All fish and seafood.  Dried beans, peas, or lentils. Unsalted nuts and seeds. Unsalted canned beans.  Dairy Low-fat dairy products, such as skim or 1% milk, 2% or reduced-fat cheeses, low-fat ricotta or cottage cheese, or plain low-fat yogurt. Low-sodium or reduced-sodium cheeses.  Fats and Oils Tub margarines without trans fats. Light or reduced-fat mayonnaise and salad dressings (reduced sodium). Avocado. Safflower, olive, or canola oils. Natural peanut or almond butter.  Other Unsalted popcorn and pretzels. The items listed above may not be a complete list of recommended foods or beverages. Contact your dietitian for more options.  +++++++++++++++  WHAT FOODS ARE NOT RECOMMENDED? Grains/ White flour or wheat flour White bread. White pasta. White rice. Refined cornbread. Bagels and croissants. Crackers that contain trans fat.  Vegetables  Creamed or fried vegetables. Vegetables in a . Regular canned vegetables. Regular canned tomato sauce and paste. Regular tomato and vegetable juices.  Fruits Dried fruits. Canned fruit in light or heavy syrup. Fruit juice.  Meat and Other Protein Products Meat in general - RED meat & White meat.  Fatty cuts of meat. Ribs, chicken wings, all processed meats as bacon, sausage, bologna, salami, fatback, hot dogs, bratwurst and packaged luncheon meats.  Dairy Whole or 2%  milk, cream, half-and-half, and cream cheese. Whole-fat or sweetened yogurt. Full-fat cheeses or blue cheese. Non-dairy creamers and whipped toppings. Processed cheese, cheese spreads, or cheese curds.  Condiments Onion and garlic salt, seasoned salt, table salt, and sea salt. Canned and packaged gravies. Worcestershire sauce. Tartar sauce. Barbecue sauce. Teriyaki sauce. Soy sauce, including reduced sodium. Steak sauce.  Fish sauce. Oyster sauce. Cocktail sauce. Horseradish. Ketchup and mustard. Meat flavorings and tenderizers. Bouillon cubes. Hot sauce. Tabasco sauce. Marinades. Taco seasonings. Relishes.  Fats and Oils Butter, stick margarine, lard, shortening and bacon fat. Coconut, palm kernel, or palm oils. Regular salad dressings.  Pickles and olives. Salted popcorn and pretzels.  The items listed above may not be a complete list of foods and beverages to avoid.

## 2021-11-07 NOTE — Progress Notes (Signed)
° ° °  Future Appointments  Date Time Provider Department  11/08/2021 10:30 AM Unk Pinto, MD [facility]  11/14/2021 11:00 AM Unk Pinto, MD [facility]    History of Present Illness:     Patient is a 70 yo DWM with HTN, HLD, GERD, Vit B12 Def. and Vit Deficiency  who presents c/o swelling in hand & feet despite being on low dose HCTZ 12.5 mg.     Medications    atenolol 25 MG tablet, TAKE 1 TABLET  TWICE DAILY FOR BLOOD  PRESSURE   atorvastatin 40 MG tablet, TAKE 1 TABLET 4 TIMES /WEEK ON  MON-WED-FRI- Saturday   hydrochlorothiazide  12.5 MG capsule, TAKE 1 CAPSULE BY MOUTH  DAILY    tadalafil 20 MG tablet, Take 1/2 to 1 tablet every 2 to 3 days as needed for XXXX    aspirin EC 81 MG tablet, Take 162 mg  daily.   meloxicam  15 MG tablet, TAKE 1/2 TO 1 TABLET  MON, WED, FRI   VITAMIN B 12  500 MCG TABS, Take 1 tablet  daily.   pantoprazole  40 MG tablet, TAKE 1 TABLET   DAILY    tamsulosin )0.4 MG CAPS capsule, TAKE 1 CAPSULE   DAILY    VITAMIN D, Take 10,000 Units 3 times a week.  Problem list He has Hypertension; Hyperlipidemia; Other abnormal glucose; GERD (gastroesophageal reflux disease); Vitamin D deficiency; Medication management; Tobacco abuse; Overweight (BMI 25.0-29.9); BPH associated with nocturia; History of cardioembolic cerebrovascular accident (CVA); Senile purpura ([place]); History of adenomatous polyp of colon; and Diverticulosis on their problem list.   Observations/Objective:  BP 124/72    Pulse 65    Temp 97.9 F (36.6 C)    Resp 16    Ht 5\' 10"  (1.778 m)    Wt 219 lb 12.8 oz (99.7 kg)    SpO2 96%    BMI 31.54 kg/m   HEENT - WNL. Neck - supple.  Chest - Clear equal BS. Cor - Nl HS. RRR w/o sig MGR. PP 1(+). 1+ ankle / pretibial edema.  edema. MS- FROM w/o deformities.  Gait Nl. Neuro -  Nl w/o focal abnormalities.   Assessment and Plan:  1. Essential hypertension  - CBC with Differential/Platelet - COMPLETE METABOLIC PANEL WITH GFR -  Magnesium - TSH  2. Hyperlipidemia, mixed  - Lipid panel - TSH  3. Abnormal glucose  - Hemoglobin A1c - Insulin, random  4. Vitamin D deficiency  - VITAMIN D 25 Hydroxy   5. 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 Medication management  - CBC with Differential/Platelet - COMPLETE METABOLIC PANEL WITH GFR - Magnesium - Lipid panel - TSH - Hemoglobin A1c - Insulin, random - VITAMIN D 25 Hydroxy   Follow Up Instructions:        I discussed the assessment and treatment plan with the patient. The patient was provided an opportunity to ask questions and all were answered. The patient agreed with the plan and demonstrated an understanding of the instructions.       The patient was advised to call back or seek an in-person evaluation if the symptoms worsen or if the condition fails to improve as anticipated.    Kirtland Bouchard, MD

## 2021-11-08 ENCOUNTER — Encounter: Payer: Self-pay | Admitting: Internal Medicine

## 2021-11-08 ENCOUNTER — Ambulatory Visit (INDEPENDENT_AMBULATORY_CARE_PROVIDER_SITE_OTHER): Payer: Medicare Other | Admitting: Internal Medicine

## 2021-11-08 ENCOUNTER — Other Ambulatory Visit: Payer: Self-pay

## 2021-11-08 VITALS — BP 124/72 | HR 65 | Temp 97.9°F | Resp 16 | Ht 70.0 in | Wt 219.8 lb

## 2021-11-08 DIAGNOSIS — Z79899 Other long term (current) drug therapy: Secondary | ICD-10-CM | POA: Diagnosis not present

## 2021-11-08 DIAGNOSIS — I872 Venous insufficiency (chronic) (peripheral): Secondary | ICD-10-CM | POA: Diagnosis not present

## 2021-11-08 DIAGNOSIS — R7309 Other abnormal glucose: Secondary | ICD-10-CM

## 2021-11-08 DIAGNOSIS — R600 Localized edema: Secondary | ICD-10-CM | POA: Diagnosis not present

## 2021-11-08 DIAGNOSIS — I10 Essential (primary) hypertension: Secondary | ICD-10-CM | POA: Diagnosis not present

## 2021-11-08 DIAGNOSIS — E782 Mixed hyperlipidemia: Secondary | ICD-10-CM | POA: Diagnosis not present

## 2021-11-08 DIAGNOSIS — E559 Vitamin D deficiency, unspecified: Secondary | ICD-10-CM

## 2021-11-08 MED ORDER — FUROSEMIDE 40 MG PO TABS: ORAL_TABLET | ORAL | 0 refills | Status: DC

## 2021-11-09 LAB — COMPLETE METABOLIC PANEL WITH GFR
AG Ratio: 1.4 (calc) (ref 1.0–2.5)
ALT: 15 U/L (ref 9–46)
AST: 19 U/L (ref 10–35)
Albumin: 4 g/dL (ref 3.6–5.1)
Alkaline phosphatase (APISO): 77 U/L (ref 35–144)
BUN: 14 mg/dL (ref 7–25)
CO2: 28 mmol/L (ref 20–32)
Calcium: 9.6 mg/dL (ref 8.6–10.3)
Chloride: 100 mmol/L (ref 98–110)
Creat: 1.18 mg/dL (ref 0.70–1.35)
Globulin: 2.8 g/dL (calc) (ref 1.9–3.7)
Glucose, Bld: 129 mg/dL — ABNORMAL HIGH (ref 65–99)
Potassium: 3.6 mmol/L (ref 3.5–5.3)
Sodium: 137 mmol/L (ref 135–146)
Total Bilirubin: 0.8 mg/dL (ref 0.2–1.2)
Total Protein: 6.8 g/dL (ref 6.1–8.1)
eGFR: 67 mL/min/{1.73_m2} (ref >=60)

## 2021-11-09 LAB — CBC WITH DIFFERENTIAL/PLATELET
Absolute Monocytes: 409 cells/uL (ref 200–950)
Basophils Absolute: 41 cells/uL (ref 0–200)
Basophils Relative: 0.9 %
Eosinophils Absolute: 179 cells/uL (ref 15–500)
Eosinophils Relative: 3.9 %
HCT: 45.2 % (ref 38.5–50.0)
Hemoglobin: 15.3 g/dL (ref 13.2–17.1)
Lymphs Abs: 975 cells/uL (ref 850–3900)
MCH: 32.5 pg (ref 27.0–33.0)
MCHC: 33.8 g/dL (ref 32.0–36.0)
MCV: 96 fL (ref 80.0–100.0)
MPV: 10.4 fL (ref 7.5–12.5)
Monocytes Relative: 8.9 %
Neutro Abs: 2995 cells/uL (ref 1500–7800)
Neutrophils Relative %: 65.1 %
Platelets: 225 10*3/uL (ref 140–400)
RBC: 4.71 10*6/uL (ref 4.20–5.80)
RDW: 12.2 % (ref 11.0–15.0)
Total Lymphocyte: 21.2 %
WBC: 4.6 10*3/uL (ref 3.8–10.8)

## 2021-11-09 LAB — LIPID PANEL
Cholesterol: 136 mg/dL (ref <200)
HDL: 37 mg/dL — ABNORMAL LOW (ref >=40)
LDL Cholesterol (Calc): 74 mg/dL (calc)
Non-HDL Cholesterol (Calc): 99 mg/dL (calc) (ref <130)
Total CHOL/HDL Ratio: 3.7 (calc) (ref <5.0)
Triglycerides: 171 mg/dL — ABNORMAL HIGH (ref <150)

## 2021-11-09 LAB — HEMOGLOBIN A1C
Hgb A1c MFr Bld: 5.6 % of total Hgb (ref <5.7)
Mean Plasma Glucose: 114 mg/dL
eAG (mmol/L): 6.3 mmol/L

## 2021-11-09 LAB — MAGNESIUM: Magnesium: 2 mg/dL (ref 1.5–2.5)

## 2021-11-09 LAB — TSH: TSH: 1.66 mIU/L (ref 0.40–4.50)

## 2021-11-09 LAB — VITAMIN D 25 HYDROXY (VIT D DEFICIENCY, FRACTURES): Vit D, 25-Hydroxy: 64 ng/mL (ref 30–100)

## 2021-11-09 LAB — INSULIN, RANDOM: Insulin: 82.5 u[IU]/mL — ABNORMAL HIGH

## 2021-11-09 NOTE — Progress Notes (Signed)
============================================================ °-   Test results slightly outside the reference range are not unusual. If there is anything important, I will review this with you,  otherwise it is considered normal test values.  If you have further questions,  please do not hesitate to contact me at the office or via My Chart.  ============================================================ ============================================================  -  CBC - Normal  ============================================================ ============================================================  - Total Chol  = 136    Excellent   - Very low risk for Heart Attack  / Stroke ============================================================ ============================================================  -  A1c - Normal - Npo Diabetes ============================================================ ============================================================  - Vit D = Excellent  ============================================================ ============================================================  - Kidneys & Electrolytes are Normal - Potassium is OK   - So OK to take the Lasix - Fluid pill  ============================================================ ============================================================

## 2021-11-13 NOTE — Progress Notes (Signed)
Annual  Screening/Preventative Visit  & Comprehensive Evaluation & Examination  Future Appointments  Date Time Provider Department  11/14/2021 11:00 AM Unk Pinto, MD [facility]  11/19/2022 10:00 AM Unk Pinto, MD [facility]            This very nice 71 y.o. DWM presents for a Screening /Preventative Visit & comprehensive evaluation and management of multiple medical co-morbidities.  Patient has been followed for HTN, HLD, Prediabetes and Vitamin D Deficiency.       HTN predates  circa 2002 Patient's BP has been controlled at home.   In 2002, patient had a CVA/TIA with LUE weakness which recovered.   Today's BP is at goal -  128/76.     Patient denies any cardiac symptoms as chest pain, palpitations, shortness of breath, dizziness or ankle swelling.       Patient's hyperlipidemia is controlled with diet and gLipitor. Patient denies myalgias or other medication SE's. Last lipids were at goal except elevated Trig's :  Lab Results  Component Value Date   CHOL 136 11/08/2021   HDL 37 (L) 11/08/2021   LDLCALC 74 11/08/2021   TRIG 171 (H) 11/08/2021   CHOLHDL 3.7 11/08/2021         Patient has hx/o prediabetes  (A1c 5.7% /2013)  and patient denies reactive hypoglycemic symptoms, visual blurring, diabetic polys or paresthesias. Last A1c was at goal :   Lab Results  Component Value Date   HGBA1C 5.6 11/08/2021          Finally, patient has history of Vitamin D Deficiency ("36"/2008) and last vitamin D was at goal :   Lab Results  Component Value Date   VD25OH 64 11/08/2021     Current Outpatient Medications on File Prior to Visit  Medication Sig   aspirin EC 81 MG tablet Take 162 mg daily.   atenolol 25 MG tablet TAKE 1 TABLET TWICE  /  DAILY    atorvastatin 40 MG tablet TAKE 1 TAB 4 x/WK ON  MON-WED-FRI-Saturday    VITAMIN B 12 500 MCG TABS Take 1 tablet  daily.   furosemide  40 MG tablet Take  1 tablet  1  or 2 x /day  for    meloxicam 15 MG tablet TAKE 1/2 TO  1 TABLET  MON, WED, FRI    pantoprazole ) 40 MG tablet TAKE 1 TABLET  DAILY    tadalafil (CIALIS) 20 MG tablet Take 1/2 to 1 tablet every 2 to 3 days as needed    tamsulosin ( 0.4 MG CAPS  TAKE 1 CAPSULE  DAILY FOR PROSTATE   VITAMIN D  Take 10,000 Units     3  x / times a week.     Allergies  Allergen Reactions   Sulfa Antibiotics Nausea Only    Past Medical History:  Diagnosis Date   Arthritis    ED (erectile dysfunction)    GERD (gastroesophageal reflux disease)    Hyperlipidemia    Hypertension    Pre-diabetes    Stroke (HCC)    TIA - around age 34   TIA (transient ischemic attack)    Vitamin D deficiency     Health Maintenance  Topic Date Due   COVID-19 Vaccine (1) Never done   Zoster Vaccines- Shingrix (1 of 2) Never done   INFLUENZA VACCINE  Never done   TETANUS/TDAP  12/14/2024   COLONOSCOPY \ 01/01/2026   Pneumonia Vaccine 82+ Years old  Completed   Hepatitis C Screening  Completed   HPV VACCINES  Aged Out   Fecal DNA (Cologuard)  Discontinued     Immunization History  Administered Date(s) Administered   PPD Test 01/11/2016, 02/28/2017   Pneumococcal -13 08/17/2018   Pneumococcal -23 09/28/2019   Pneumococcal-23 11/24/2008   Td 11/11/2004   Tdap 12/14/2014   01/01/2021 - Colonoscopy - Dr   [name]  -  Recc 5 yr f/u due Feb 2027    Past Surgical History:  Procedure Laterality Date   HERNIA REPAIR Right    INGUINAL    Family History  Problem Relation Age of Onset   Cancer Mother        LYMPHOMA   Hyperlipidemia Mother    Hypertension Mother    Diabetes Father    Hypertension Father    Stroke Father    Kidney disease Father    Diabetes Sister    Drug abuse Son        DIED FROM OD   Early death Daughter    Prostate cancer Maternal Uncle    Colon cancer Neg Hx    Esophageal cancer Neg Hx    Pancreatic cancer Neg Hx    Rectal cancer Neg Hx    Stomach cancer Neg Hx      Social History   Tobacco Use   Smoking status: Every Day     Packs/day: 0.65    Years: 52.00    Pack years: 33.80    Types: Cigarettes    Start date: 1970   Smokeless tobacco: Never   Tobacco comments:    trying to quit and smoking 2 to 5 cigarettes a day.  Vaping Use   Vaping Use: Never used  Substance Use Topics   Alcohol use: No    Alcohol/week: 0.0 standard drinks   Drug use: No     ROS Constitutional: Denies fever, chills, weight loss/gain, headaches, insomnia,  night sweats or change in appetite. Does c/o fatigue. Eyes: Denies redness, blurred vision, diplopia, discharge, itchy or watery eyes.  ENT: Denies discharge, congestion, post nasal drip, epistaxis, sore throat, earache, hearing loss, dental pain, Tinnitus, Vertigo, Sinus pain or snoring.  Cardio: Denies chest pain, palpitations, irregular heartbeat, syncope, dyspnea, diaphoresis, orthopnea, PND, claudication or edema Respiratory: denies cough, dyspnea, DOE, pleurisy, hoarseness, laryngitis or wheezing.  Gastrointestinal: Denies dysphagia, heartburn, reflux, water brash, pain, cramps, nausea, vomiting, bloating, diarrhea, constipation, hematemesis, melena, hematochezia, jaundice or hemorrhoids Genitourinary: Denies dysuria, frequency, urgency, nocturia, hesitancy, discharge, hematuria or flank pain Musculoskeletal: Denies arthralgia, myalgia, stiffness, Jt. Swelling, pain, limp or strain/sprain. Denies Falls. Skin: Denies puritis, rash, hives, warts, acne, eczema or change in skin lesion Neuro: No weakness, tremor, incoordination, spasms, paresthesia or pain Psychiatric: Denies confusion, memory loss or sensory loss. Denies Depression. Endocrine: Denies change in weight, skin, hair change, nocturia, and paresthesia, diabetic polys, visual blurring or hyper / hypo glycemic episodes.  Heme/Lymph: No excessive bleeding, bruising or enlarged lymph nodes.   Physical Exam  BP 128/76    Pulse 60    Temp 97.9 F (36.6 C)    Resp 16    Ht 5\' 10"  (1.778 m)    Wt 216 lb 6.4 oz (98.2 kg)     SpO2 97%    BMI 31.05 kg/m   General Appearance: Well nourished and well groomed and in no apparent distress.  Eyes: PERRLA, EOMs, conjunctiva no swelling or erythema, normal fundi and vessels. Sinuses: No frontal/maxillary tenderness ENT/Mouth: EACs patent / TMs  nl. Nares clear without erythema, swelling,  mucoid exudates. Oral hygiene is good. No erythema, swelling, or exudate. Tongue normal, non-obstructing. Tonsils not swollen or erythematous. Hearing normal.  Neck: Supple, thyroid not palpable. No bruits, nodes or JVD. Respiratory: Respiratory effort normal.  BS equal and clear bilateral without rales, rhonci, wheezing or stridor. Cardio: Heart sounds are normal with regular rate and rhythm and no murmurs, rubs or gallops. Peripheral pulses are normal and equal bilaterally without edema. No aortic or femoral bruits. Chest: symmetric with normal excursions and percussion.  Abdomen: Soft, with Nl bowel sounds. Nontender, no guarding, rebound, hernias, masses, or organomegaly.  Lymphatics: Non tender without lymphadenopathy.  Musculoskeletal: Full ROM all peripheral extremities, joint stability, 5/5 strength, and normal gait. Skin: Warm and dry without rashes, lesions, cyanosis, clubbing or  ecchymosis.  Neuro: Cranial nerves intact, reflexes equal bilaterally. Normal muscle tone, no cerebellar symptoms. Sensation intact.  Pysch: Alert and oriented X 3 with normal affect, insight and judgment appropriate.   Assessment and Plan  1. Annual Preventative/Screening Exam    2. Essential hypertension   3. Hyperlipidemia, mixed   4. Abnormal glucose   5. Vitamin D deficiency   6. BPH with obstruction/lower urinary tract symptoms   7. Prostate cancer screening  - PSA  8. Screening for ischemic heart disease   9. FHx: heart disease   10. Smoker   11. Screening for AAA (aortic abdominal aneurysm)  12. Medication management       Labs done 5 days ago are reviewed with  patient.           Patient was counseled in prudent diet, weight control to achieve/maintain BMI less than 25, BP monitoring, regular exercise and medications as discussed.  Discussed med effects and SE's. Routine screening labs and tests as requested with regular follow-up as recommended. Over 40 minutes of exam, counseling, chart review and high complex critical decision making was performed   Kirtland Bouchard, MD

## 2021-11-13 NOTE — Patient Instructions (Signed)

## 2021-11-14 ENCOUNTER — Ambulatory Visit (INDEPENDENT_AMBULATORY_CARE_PROVIDER_SITE_OTHER): Payer: Medicare Other | Admitting: Internal Medicine

## 2021-11-14 ENCOUNTER — Encounter: Payer: Self-pay | Admitting: Internal Medicine

## 2021-11-14 ENCOUNTER — Other Ambulatory Visit: Payer: Self-pay

## 2021-11-14 VITALS — BP 128/76 | HR 60 | Temp 97.9°F | Resp 16 | Ht 70.0 in | Wt 216.4 lb

## 2021-11-14 DIAGNOSIS — I10 Essential (primary) hypertension: Secondary | ICD-10-CM | POA: Diagnosis not present

## 2021-11-14 DIAGNOSIS — N138 Other obstructive and reflux uropathy: Secondary | ICD-10-CM | POA: Diagnosis not present

## 2021-11-14 DIAGNOSIS — Z79899 Other long term (current) drug therapy: Secondary | ICD-10-CM

## 2021-11-14 DIAGNOSIS — E559 Vitamin D deficiency, unspecified: Secondary | ICD-10-CM

## 2021-11-14 DIAGNOSIS — Z0000 Encounter for general adult medical examination without abnormal findings: Secondary | ICD-10-CM

## 2021-11-14 DIAGNOSIS — R7309 Other abnormal glucose: Secondary | ICD-10-CM

## 2021-11-14 DIAGNOSIS — Z0001 Encounter for general adult medical examination with abnormal findings: Secondary | ICD-10-CM

## 2021-11-14 DIAGNOSIS — Z125 Encounter for screening for malignant neoplasm of prostate: Secondary | ICD-10-CM

## 2021-11-14 DIAGNOSIS — E782 Mixed hyperlipidemia: Secondary | ICD-10-CM

## 2021-11-14 DIAGNOSIS — Z136 Encounter for screening for cardiovascular disorders: Secondary | ICD-10-CM | POA: Diagnosis not present

## 2021-11-14 DIAGNOSIS — F17200 Nicotine dependence, unspecified, uncomplicated: Secondary | ICD-10-CM

## 2021-11-14 DIAGNOSIS — Z8249 Family history of ischemic heart disease and other diseases of the circulatory system: Secondary | ICD-10-CM

## 2021-11-15 LAB — BASIC METABOLIC PANEL WITH GFR
BUN: 21 mg/dL (ref 7–25)
CO2: 32 mmol/L (ref 20–32)
Calcium: 9.5 mg/dL (ref 8.6–10.3)
Chloride: 101 mmol/L (ref 98–110)
Creat: 1.08 mg/dL (ref 0.70–1.35)
Glucose, Bld: 107 mg/dL — ABNORMAL HIGH (ref 65–99)
Potassium: 4 mmol/L (ref 3.5–5.3)
Sodium: 139 mmol/L (ref 135–146)
eGFR: 75 mL/min/{1.73_m2} (ref >=60)

## 2021-11-15 LAB — MICROALBUMIN / CREATININE URINE RATIO
Creatinine, Urine: 58 mg/dL (ref 20–320)
Microalb Creat Ratio: 5 mcg/mg creat (ref <30)
Microalb, Ur: 0.3 mg/dL

## 2021-11-15 LAB — URINALYSIS, ROUTINE W REFLEX MICROSCOPIC
Bilirubin Urine: NEGATIVE
Glucose, UA: NEGATIVE
Hgb urine dipstick: NEGATIVE
Ketones, ur: NEGATIVE
Leukocytes,Ua: NEGATIVE
Nitrite: NEGATIVE
Protein, ur: NEGATIVE
Specific Gravity, Urine: 1.01 (ref 1.001–1.035)
pH: <=5 (ref 5.0–8.0)

## 2021-11-15 LAB — PSA: PSA: 0.49 ng/mL (ref <=4.00)

## 2022-01-17 ENCOUNTER — Other Ambulatory Visit: Payer: Self-pay | Admitting: Adult Health

## 2022-01-17 DIAGNOSIS — N320 Bladder-neck obstruction: Secondary | ICD-10-CM

## 2022-01-17 DIAGNOSIS — K219 Gastro-esophageal reflux disease without esophagitis: Secondary | ICD-10-CM

## 2022-02-06 ENCOUNTER — Other Ambulatory Visit: Payer: Self-pay | Admitting: Adult Health

## 2022-02-21 ENCOUNTER — Encounter: Payer: Self-pay | Admitting: Adult Health

## 2022-02-21 ENCOUNTER — Ambulatory Visit (INDEPENDENT_AMBULATORY_CARE_PROVIDER_SITE_OTHER): Payer: Medicare Other | Admitting: Adult Health

## 2022-02-21 VITALS — BP 120/76 | HR 60 | Temp 97.7°F | Wt 224.0 lb

## 2022-02-21 DIAGNOSIS — R7309 Other abnormal glucose: Secondary | ICD-10-CM

## 2022-02-21 DIAGNOSIS — E782 Mixed hyperlipidemia: Secondary | ICD-10-CM

## 2022-02-21 DIAGNOSIS — I872 Venous insufficiency (chronic) (peripheral): Secondary | ICD-10-CM | POA: Diagnosis not present

## 2022-02-21 DIAGNOSIS — Z122 Encounter for screening for malignant neoplasm of respiratory organs: Secondary | ICD-10-CM | POA: Diagnosis not present

## 2022-02-21 DIAGNOSIS — Z87891 Personal history of nicotine dependence: Secondary | ICD-10-CM | POA: Insufficient documentation

## 2022-02-21 DIAGNOSIS — I10 Essential (primary) hypertension: Secondary | ICD-10-CM | POA: Diagnosis not present

## 2022-02-21 DIAGNOSIS — D692 Other nonthrombocytopenic purpura: Secondary | ICD-10-CM | POA: Diagnosis not present

## 2022-02-21 DIAGNOSIS — N138 Other obstructive and reflux uropathy: Secondary | ICD-10-CM | POA: Diagnosis not present

## 2022-02-21 DIAGNOSIS — R600 Localized edema: Secondary | ICD-10-CM | POA: Diagnosis not present

## 2022-02-21 DIAGNOSIS — N401 Enlarged prostate with lower urinary tract symptoms: Secondary | ICD-10-CM

## 2022-02-21 DIAGNOSIS — Z79899 Other long term (current) drug therapy: Secondary | ICD-10-CM

## 2022-02-21 NOTE — Patient Instructions (Signed)
Please pick up compression socks and start wearing these daily (up to knees) ? ?Please get up and walk for 5 minutes (or do ankle/calf pump exercises) every 1-2 hours ? ?Please raise your legs up to near heart level if sitting whenever possible ? ?Please try to keep salt intake low (high in restaurant food, packaged foods, canned foods) - choose fresh or low sodium ? ?Drink plenty of water, 65-80+ fluid ounce daily  ? ?Can take an extra dose of furosemide in the afternoon if needed ? ? ? ?Can try taking 2 caps of tamsulosin/flomax and leg me know in 1 week if this helps with passing urine ? ?You should hear in 1-2 weeks about setting up a CT scan for lung cancer screening ? ? ? ?Chronic Venous Insufficiency ?Chronic venous insufficiency is a condition where the leg veins cannot effectively pump blood from the legs to the heart. This happens when the vein walls are either stretched, weakened, or damaged, or when the valves inside the vein are damaged. With the right treatment, you should be able to continue with an active life. This condition is also called venous stasis. ?What are the causes? ?Common causes of this condition include: ?High blood pressure inside the veins (venous hypertension). ?Sitting or standing too long, causing increased blood pressure in the leg veins. ?What increases the risk? ?The following factors may make you more likely to develop this condition: ?Having a family history of this condition. ?Obesity. ?Pregnancy. ?Living without enough regular physical activity or exercise (sedentary lifestyle). ?Smoking. ?Having a job that requires long periods of standing or sitting in one place. ?Being a certain age. Women in their 24s and 59s and men in their 70s are more likely to develop this condition. ?What are the signs or symptoms? ?Symptoms of this condition include: ?Veins that are enlarged, bulging, or twisted (varicose veins). ?Skin breakdown or ulcers. ?Reddened skin or dark discoloration of  skin on the leg between the knee and ankle. ?Brown, smooth, tight, and painful skin just above the ankle, usually on the inside of the leg (lipodermatosclerosis). ?Swelling of the legs. ?How is this diagnosed? ?This condition may be diagnosed based on: ?Your medical history. ?A physical exam. ?How is this treated? ?The goals of treatment are to help you return to an active life and to minimize pain or disability. Treatment depends on the severity of your condition, and it may include: ?Wearing compression stockings. These can help relieve symptoms and help prevent your condition from getting worse. However, they do not cure the condition. ?Sclerotherapy. This procedure involves an injection of a solution that shrinks damaged veins. ?Surgery. This may involve: ?Removing a diseased vein (vein stripping). ?Cutting off blood flow through the vein (laser ablation surgery). ?Repairing or reconstructing a valve within the affected vein. ?Follow these instructions at home: ?  ?Wear compression stockings as told by your health care provider. These stockings help to prevent blood clots and reduce swelling in your legs. ?Take over-the-counter and prescription medicines only as told by your health care provider. ?Stay active by exercising, walking, or doing different activities. Ask your health care provider what activities are safe for you and how much exercise you need. ?Drink enough fluid to keep your urine pale yellow. ?Do not use any products that contain nicotine or tobacco, such as cigarettes, e-cigarettes, and chewing tobacco. If you need help quitting, ask your health care provider. ?Keep all follow-up visits as told by your health care provider. This is important. ?Contact a  health care provider if you: ?Have redness, swelling, or more pain in the affected area. ?See a red streak or line that goes up or down from the affected area. ?Have skin breakdown or skin loss in the affected area, even if the breakdown is  small. ?Get an injury in the affected area. ?Get help right away if: ?You get an injury and an open wound in the affected area. ?You have: ?Severe pain that does not get better with medicine. ?Sudden numbness or weakness in the foot or ankle below the affected area. ?Trouble moving your foot or ankle. ?A fever. ?Worse or persistent symptoms. ?Chest pain. ?Shortness of breath. ?Summary ?Chronic venous insufficiency is a condition where the leg veins cannot effectively pump blood from the legs to the heart. ?Chronic venous insufficiency occurs when the vein walls become stretched, weakened, or damaged, or when valves within the vein are damaged. ?Treatment depends on how severe your condition is. It often involves wearing compression stockings and may involve having a procedure. ?Make sure you stay active by exercising, walking, or doing different activities. Ask your health care provider what activities are safe for you and how much exercise you need. ?This information is not intended to replace advice given to you by your health care provider. Make sure you discuss any questions you have with your health care provider. ?Document Revised: 01/09/2021 Document Reviewed: 01/09/2021 ?Elsevier Patient Education ? [place]. ? ?

## 2022-02-21 NOTE — Progress Notes (Signed)
3 MONTH FOLLOW UP ?Assessment:  ? ? ?Senile Purpura ([place]) ?Discussed process, protect skin, sunscreen ? ?Essential hypertension ?- continue medications, DASH diet, exercise and monitor at home. Call if greater than 130/80.  ? ?Other abnormal glucose ?Recent A1Cs at goal  ?Discussed disease progression and risks ?Discussed diet/exercise, weight management and risk modification ?Check A1C annually ? ?Mixed hyperlipidemia ?check lipids ?LDL goal <70 with CVA/smoking  ?decrease fatty foods ?increase activity.  ? ?Medication management ?-     CBC with Differential/Platelet ?-     COMPLETE METABOLIC PANEL WITH GFR ? ?Gastroesophageal reflux disease, esophagitis presence not specified ?Well managed on current medications ?Discussed diet, avoiding triggers and other lifestyle changes ? ?Vitamin D deficiency ?Continue supplement ? ?Obesity - BMI 32 ?- long discussion about weight loss, diet, and exercise ?-recommended diet heavy in fruits and veggies and low in animal meats, cheeses, and dairy products ?- increased weight may be contributing to edema ? ?BPH associated with nocturia ?Having more slow stream, try tamsulosin 0.8 mg at night x 1 week and call back with progress ? ?Former smoker (33.8 pack year hx, quit 09/2021)  ?-lung cancer screening with low dose CT discussed as recommended by guidelines based on age, number of pack year history.  Discussed risks of screening including but not limited to false positives on xray, further testing or consultation with specialist, and possible false negative CT as well. Understanding expressed and wishes to proceed with CT testing. Order placed.  ? ?History of CVA ?Has quit smoking; Continue ASA  ?LDL goal <70; titrate statin accordingly  ? ?Peripheral edema/venous stasis ?Had normal labs; exam and hx not suggestive of CHF ?Discussed likely etiology;  ?Encouraged increased walking, elevate feet when sitting ?Compression hose daily  ?Weight loss encouraged ?Follow up if getting  worse ?Go to the ER if any chest pain, shortness of breath, severe fatigue ? ?Orders Placed This Encounter  ?Procedures  ? CT CHEST LUNG CA SCREEN LOW DOSE W/O CM  ? CBC with Differential/Platelet  ? COMPLETE METABOLIC PANEL WITH GFR  ? Magnesium  ? Lipid panel  ? TSH  ? ? ?Over 30 minutes of exam, counseling, chart review, and critical decision making was performed ? ?Future Appointments  ?Date Time Provider [place]  ?05/28/2022 10:30 AM Unk Pinto, MD [facility] None  ?08/28/2022 11:00 AM Darrol Jump, NP [facility] None  ?12/03/2022 10:00 AM Unk Pinto, MD [facility] None  ? ? ?Subjective:  ?Steven Bray. is a 71 y.o. male who presents for 3 month follow up for HTN, hyperlipidemia, prediabetes, and vitamin D Def.  ? ?He is a former smoker, successfully quit 09/2021 with 33.8 pack year hx.  ?He has never had low dose CT. He is concerned about cost but otherwise receptive.  ? ?GERD is well controlled by protonix 40 mg daily.  ? ?He had + cologuard in 2019, did finally have colonoscopy in 12/2020 by Dr. [name] showing adenomatous polyps recommended for 5 year recall. He notes bloating and loose stools since then, plans to schedule a follow up.  ? 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  ?Wt Readings from Last 3 Encounters:  ?02/21/22 224 lb (101.6 kg)  ?11/14/21 216 lb 6.4 oz (98.2 kg)  ?11/08/21 219 lb 12.8 oz (99.7 kg)  ? ?Patient has hx/o CVA/TIA in 2002 with LUE weakness which recovered.  He did have CT head 03/2020 showing chronic lacunar infarctions, plavix was recommended and neuro referral placed but patient declined. He continues on ASA 81 mg x 2 daily. Declined plavix or referral. ? ?His blood pressure has been controlled at home ,has had HTN x 2002, today their BP is BP: 120/76.  ?He does not workout. He denies chest pain, shortness of breath, dizziness.  ? ?He  is concerned about ongoing peripheral edema for last 4 months, normal labs by Dr. Melford Aase 11/14/2021 and was prescribed lasix 40 mg BID PRN, taking once daily with some improvement but concerned about ongoing edema at the end of the day, does improve by AM. Denies fatigue, dyspnea, PND, CP. Reports low sodium diet.  ? ?He is on cholesterol medication (atorvastatin 40 mg MWFSat) and denies myalgias. His cholesterol is not at goal less than 70. The cholesterol last visit was:   ?Lab Results  ?Component Value Date  ? CHOL 136 11/08/2021  ? HDL 37 (L) 11/08/2021  ? [place] 74 11/08/2021  ? TRIG 171 (H) 11/08/2021  ? CHOLHDL 3.7 11/08/2021  ? ?He has been working on diet and exercise for hx of prediabetes (5.7 in 2013), and denies paresthesia of the feet, polydipsia and polyuria. Last A1C in the office was:  ?Lab Results  ?Component Value Date  ? HGBA1C 5.6 11/08/2021  ? ?Last GFR ?Lab Results  ?Component Value Date  ? EGFR 75 11/14/2021  ? ? Patient is on Vitamin D supplement, was taking 50000 IU weekly, now taking 10000 IU three days/week ?Lab Results  ?Component Value Date  ? VD25OH 64 11/08/2021  ?   ?He has been taking flomax 0.4 mg and cialis 20 mg PRN, reports intermittent slow stream in recent weeks ?Lab Results  ?Component Value Date  ? PSA 0.49 11/14/2021  ? PSA 0.60 10/26/2020  ? PSA 0.3 09/28/2019  ? ? ? ? ?Medication Review: ? ? ?Current Outpatient Medications (Cardiovascular):  ?  atenolol (TENORMIN) 25 MG tablet, TAKE 1 TABLET BY MOUTH  TWICE DAILY FOR BLOOD  PRESSURE ?  atorvastatin (LIPITOR) 40 MG tablet, TAKE 1 TABLET BY MOUTH 4  TIMES A WEEK ON MONDAY ,  WEDNESDAY , FRIDAY AND  SATURDAY FOR CHOLESTEROL ?  furosemide (LASIX) 40 MG tablet, Take  1 tablet  1  or 2 x /day  for BP and Fluid Retention / Ankle Swelling ?  tadalafil (CIALIS) 20 MG tablet, Take 1/2 to 1 tablet every 2 to 3 days as needed for XXXX ? ? ?Current Outpatient Medications (Analgesics):  ?  aspirin EC 81 MG tablet, Take 162 mg by mouth  daily. ?  meloxicam (MOBIC) 15 MG tablet, TAKE 1/2 TO 1 TABLET BY  MOUTH DAILY WITH FOOD FOR  PAIN AND INFLAMMATION (Patient taking differently: TAKE 1/2 TO 1 TABLET BY  MOUTH MON, WED, FRI, and SAT WITH FOOD FOR PAIN AND INFLAMMATION) ? ?Current Outpatient Medications (Hematological):  ?  Cyanocobalamin (VITAMIN B 12) 500 MCG TABS, Take 1 tablet by mouth daily. ? ?Current Outpatient Medications (Other):  ?  pantoprazole (PROTONIX) 40 MG tablet, TAKE 1 TABLET BY MOUTH  DAILY TO PREVENT HEARTBURN  AND INDIGESTION ?  tamsulosin (FLOMAX) 0.4 MG CAPS capsule, TAKE 1 CAPSULE BY MOUTH  DAILY FOR PROSTATE ?  VITAMIN D PO, Take 10,000 Units by mouth 4 (four) times a week. ? ?Allergies: ?Allergies  ?Allergen Reactions  ? Sulfa Antibiotics Nausea Only  ? ? ?Current Problems (verified) ?has Hypertension; Hyperlipidemia; GERD (gastroesophageal reflux  disease); Vitamin D deficiency; Medication management; Tobacco abuse; Overweight (BMI 25.0-29.9); BPH associated with nocturia; History of cardioembolic cerebrovascular accident (CVA); Senile purpura ([place]); History of adenomatous polyp of colon; Diverticulosis; and Edema of both lower legs due to peripheral venous insufficiency on their problem list. ? ?Surgical: ?He  has a past surgical history that includes Hernia repair (Right). ?Family ?His family history includes Cancer in his mother; Diabetes in his father and sister; Drug abuse in his son; Early death in his daughter; Hyperlipidemia in his mother; Hypertension in his father and mother; Kidney disease in his father; Prostate cancer in his maternal uncle; Stroke in his father. ?Social history  ?He reports that he quit smoking about 5 months ago. His smoking use included cigarettes. He started smoking about 53 years ago. He has a 33.80 pack-year smoking history. He has never used smokeless tobacco. He reports that he does not drink alcohol and does not use drugs. ? ?Review of Systems  ?Constitutional:  Negative for  malaise/fatigue and weight loss.  ?HENT:  Negative for hearing loss and tinnitus.   ?Eyes:  Negative for blurred vision and double vision.  ?Respiratory:  Negative for cough, shortness of breath and wheezing.   ?Card

## 2022-02-22 LAB — COMPLETE METABOLIC PANEL WITH GFR
AG Ratio: 1.6 (calc) (ref 1.0–2.5)
ALT: 11 U/L (ref 9–46)
AST: 15 U/L (ref 10–35)
Albumin: 4.3 g/dL (ref 3.6–5.1)
Alkaline phosphatase (APISO): 87 U/L (ref 35–144)
BUN: 17 mg/dL (ref 7–25)
CO2: 28 mmol/L (ref 20–32)
Calcium: 9.6 mg/dL (ref 8.6–10.3)
Chloride: 104 mmol/L (ref 98–110)
Creat: 1.15 mg/dL (ref 0.70–1.35)
Globulin: 2.7 g/dL (calc) (ref 1.9–3.7)
Glucose, Bld: 102 mg/dL — ABNORMAL HIGH (ref 65–99)
Potassium: 3.8 mmol/L (ref 3.5–5.3)
Sodium: 142 mmol/L (ref 135–146)
Total Bilirubin: 0.6 mg/dL (ref 0.2–1.2)
Total Protein: 7 g/dL (ref 6.1–8.1)
eGFR: 69 mL/min/{1.73_m2} (ref >=60)

## 2022-02-22 LAB — MAGNESIUM: Magnesium: 2 mg/dL (ref 1.5–2.5)

## 2022-02-22 LAB — CBC WITH DIFFERENTIAL/PLATELET
Absolute Monocytes: 529 cells/uL (ref 200–950)
Basophils Absolute: 39 cells/uL (ref 0–200)
Basophils Relative: 0.8 %
Eosinophils Absolute: 142 cells/uL (ref 15–500)
Eosinophils Relative: 2.9 %
HCT: 44.6 % (ref 38.5–50.0)
Hemoglobin: 15.2 g/dL (ref 13.2–17.1)
Lymphs Abs: 960 cells/uL (ref 850–3900)
MCH: 32.4 pg (ref 27.0–33.0)
MCHC: 34.1 g/dL (ref 32.0–36.0)
MCV: 95.1 fL (ref 80.0–100.0)
MPV: 10.6 fL (ref 7.5–12.5)
Monocytes Relative: 10.8 %
Neutro Abs: 3229 cells/uL (ref 1500–7800)
Neutrophils Relative %: 65.9 %
Platelets: 230 10*3/uL (ref 140–400)
RBC: 4.69 10*6/uL (ref 4.20–5.80)
RDW: 12.5 % (ref 11.0–15.0)
Total Lymphocyte: 19.6 %
WBC: 4.9 10*3/uL (ref 3.8–10.8)

## 2022-02-22 LAB — LIPID PANEL
Cholesterol: 131 mg/dL (ref <200)
HDL: 38 mg/dL — ABNORMAL LOW (ref >=40)
LDL Cholesterol (Calc): 71 mg/dL (calc)
Non-HDL Cholesterol (Calc): 93 mg/dL (calc) (ref <130)
Total CHOL/HDL Ratio: 3.4 (calc) (ref <5.0)
Triglycerides: 136 mg/dL (ref <150)

## 2022-02-22 LAB — TSH: TSH: 1.33 mIU/L (ref 0.40–4.50)

## 2022-03-10 ENCOUNTER — Other Ambulatory Visit: Payer: Self-pay | Admitting: Adult Health

## 2022-03-10 DIAGNOSIS — M159 Polyosteoarthritis, unspecified: Secondary | ICD-10-CM

## 2022-03-13 ENCOUNTER — Ambulatory Visit (INDEPENDENT_AMBULATORY_CARE_PROVIDER_SITE_OTHER): Payer: Medicare Other | Admitting: Adult Health

## 2022-03-13 ENCOUNTER — Encounter: Payer: Self-pay | Admitting: Adult Health

## 2022-03-13 VITALS — BP 110/70 | HR 60 | Temp 97.9°F | Resp 17 | Ht 70.0 in | Wt 224.0 lb

## 2022-03-13 DIAGNOSIS — M2550 Pain in unspecified joint: Secondary | ICD-10-CM

## 2022-03-13 DIAGNOSIS — G8929 Other chronic pain: Secondary | ICD-10-CM | POA: Diagnosis not present

## 2022-03-13 DIAGNOSIS — M791 Myalgia, unspecified site: Secondary | ICD-10-CM | POA: Diagnosis not present

## 2022-03-13 DIAGNOSIS — M79645 Pain in left finger(s): Secondary | ICD-10-CM | POA: Diagnosis not present

## 2022-03-13 DIAGNOSIS — W57XXXA Bitten or stung by nonvenomous insect and other nonvenomous arthropods, initial encounter: Secondary | ICD-10-CM

## 2022-03-13 NOTE — Progress Notes (Signed)
Assessment and Plan: ? ?Steven Bray was seen today for acute visit. ? ?Diagnoses and all orders for this visit: ? ?Polyarthralgia ?Tick bite of abdomen, initial encounter ?Reviewed with patient photo he presented is typical for [place] tick, Lyme's typically with deer tick, however fairly anxious about r/o and also r/o statin etiology, will check burgdorfi antibodies and CK though my suspicion for either etiology is low ?Reviewed Alpha-gal, monitor for any sx suggestive of red meat intolerance and follow up if any concerns ?Otherwise no intervention needed for tick bite, monitor ?-     CK ?-     B. Burgdorfi Antibodies ? ?Chronic thumb pain, left ?Chronic likely arthritis, ambivalent about imaging but will go ahead and order to have available. Continue meloxicam, consider trial of topical voltaren. Steroid taper if flaring and hand specialist referral if progressive for injections.  ?-     DG Hand Complete Left; Future ? ?Further disposition pending results of labs. Discussed med's effects and SE's.   ?Over 20 minutes of exam, counseling, chart review, and critical decision making was performed.  ? ?Future Appointments  ?Date Time Provider [place]  ?03/19/2022 12:40 PM GI-315 CT 1 GI-315CT [facility] [facility]  ?04/05/2022  9:20 AM Danis, Kirke Corin, MD [facility]  ?05/28/2022 10:30 AM Unk Pinto, MD [facility] None  ?08/28/2022 11:00 AM Darrol Jump, NP [facility] None  ?12/03/2022 10:00 AM Unk Pinto, MD [facility] None  ? ? ?------------------------------------------------------------------------------------------------------------------ ? ? ?HPI ?BP 110/70   Pulse 60   Temp 97.9 ?F (36.6 ?C)   Resp 17   Ht '5\' 10"'$  (1.778 m)   Wt 224 lb (101.6 kg)   SpO2 97%   BMI 32.14 kg/m?  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 Denies rash or URI/fever/chills type sx. He notes mild itching at location where he removed the tick. He also notes atorvastatin frequency was increased (40 mg three days a week to 4 days a week (on review note at this dose since 05/2021). He has been taking meloxicam for aches but feels not well controlled.  ? ?Past Medical History:  ?Diagnosis Date  ? Arthritis   ? ED (erectile dysfunction)   ? GERD (gastroesophageal reflux disease)   ? Hyperlipidemia   ? Hypertension   ? Pre-diabetes   ? Stroke [facility])   ? TIA - around age 27  ? TIA (transient ischemic attack)   ? Vitamin D deficiency   ?  ? ?Allergies  ?Allergen Reactions  ? Sulfa Antibiotics Nausea Only  ? ? ?Current Outpatient Medications on File Prior to Visit  ?Medication Sig  ? aspirin EC 81 MG tablet Take 162 mg by mouth daily.  ? atenolol (TENORMIN) 25 MG tablet TAKE 1 TABLET BY MOUTH  TWICE DAILY FOR BLOOD  PRESSURE  ? atorvastatin (LIPITOR) 40 MG tablet TAKE 1 TABLET BY MOUTH 4  TIMES A WEEK ON MONDAY ,  WEDNESDAY , FRIDAY AND  SATURDAY FOR CHOLESTEROL  ? Cyanocobalamin (VITAMIN B 12) 500 MCG TABS Take 1 tablet by mouth daily.  ? furosemide (LASIX) 40 MG tablet Take  1 tablet  1  or 2 x /day  for BP and Fluid Retention / Ankle Swelling  ? meloxicam (MOBIC) 15 MG tablet Take  1/2 to 1 tablet  Daily  with Food  for Pain & Inflammation                                       /                             TAKE                           BY                       MOUTH  ? pantoprazole (PROTONIX) 40 MG tablet TAKE 1 TABLET BY MOUTH  DAILY TO PREVENT HEARTBURN  AND INDIGESTION  ? tadalafil (CIALIS) 20 MG tablet Take 1/2 to 1 tablet every 2 to 3 days as needed for XXXX  ? tamsulosin (FLOMAX) 0.4 MG CAPS capsule TAKE 1 CAPSULE BY MOUTH  DAILY FOR PROSTATE  ? VITAMIN D PO Take 10,000 Units by mouth 4 (four) times a week.  ? ?No current facility-administered medications on file prior to visit.   ? ? ?ROS: all negative except above.  ? ?Physical Exam: ? ?BP 110/70   Pulse 60   Temp 97.9 ?F (36.6 ?C)   Resp 17   Ht '5\' 10"'$  (1.778 m)   Wt 224 lb (101.6 kg)   SpO2 97%   BMI 32.14 kg/m?  ? ?General Appearance: Well nourished, in no apparent distress. ?Eyes: PERRLA, EOMs, conjunctiva no swelling or erythema ?Sinuses: No Frontal/maxillary tenderness ?ENT/Mouth: Ext aud canals clear, TMs without erythema, bulging. No erythema, swelling, or exudate on post pharynx.  Tonsils not swollen or erythematous. Hearing normal.  ?Neck: Supple, thyroid normal.  ?Respiratory: Respiratory effort normal, BS equal bilaterally without rales, rhonchi, wheezing or stridor.  ?Cardio: RRR with no MRGs. Brisk peripheral pulses without edema.  ?Abdomen: Soft, + BS.  Non tender, no guarding, rebound, hernias, masses. ?Lymphatics: Non tender without lymphadenopathy.  ?Musculoskeletal: Full ROM, 5/5 strength, normal gait. No obvious deformity, effusions.  ?Skin: Warm, dry without rashes, lesions, ecchymosis. He has small scabbed lesion without notable erythema or tenderness to R lower abdomen/hip.  ?Neuro: Cranial nerves intact. Normal muscle tone, no cerebellar symptoms. Sensation intact.  ?Psych: Awake and oriented X 3, normal affect, Insight and Judgment appropriate.  ?  ? ?Izora Ribas, NP ?12:21 PM ?[facility] ? ?

## 2022-03-14 ENCOUNTER — Telehealth: Payer: Self-pay | Admitting: Adult Health

## 2022-03-14 LAB — CK: Total CK: 119 U/L (ref 44–196)

## 2022-03-14 LAB — B. BURGDORFI ANTIBODIES: B burgdorferi Ab IgG+IgM: <0.9 index

## 2022-03-14 NOTE — Telephone Encounter (Addendum)
Pt said yesterday when he was lifting his legs up  they start cramping, and today its the same thing. He is wondering if his potassium is low and if it can be added to blood work. I asked him if he had ever had a hx of low potassium or leg cramps and he said no, I then asked him if he has been drinking water and his answer was only coffee. I told him I would send a note back but he should try his best to increase his water intake cause the sudden cramps could be caused from increase in activity and not drinking enough water.  ?

## 2022-03-19 ENCOUNTER — Ambulatory Visit
Admission: RE | Admit: 2022-03-19 | Discharge: 2022-03-19 | Disposition: A | Discharge status: Home / Self Care | Payer: Medicare Other | Source: Ambulatory Visit | Attending: Adult Health | Admitting: Adult Health

## 2022-03-19 DIAGNOSIS — Z122 Encounter for screening for malignant neoplasm of respiratory organs: Secondary | ICD-10-CM

## 2022-03-19 DIAGNOSIS — Z87891 Personal history of nicotine dependence: Secondary | ICD-10-CM | POA: Diagnosis not present

## 2022-03-21 ENCOUNTER — Encounter: Payer: Self-pay | Admitting: Adult Health

## 2022-03-21 DIAGNOSIS — I712 Thoracic aortic aneurysm, without rupture, unspecified: Secondary | ICD-10-CM | POA: Insufficient documentation

## 2022-03-21 DIAGNOSIS — I700 Atherosclerosis of aorta: Secondary | ICD-10-CM | POA: Insufficient documentation

## 2022-03-21 DIAGNOSIS — I2510 Atherosclerotic heart disease of native coronary artery without angina pectoris: Secondary | ICD-10-CM | POA: Insufficient documentation

## 2022-03-21 DIAGNOSIS — R918 Other nonspecific abnormal finding of lung field: Secondary | ICD-10-CM | POA: Insufficient documentation

## 2022-03-26 ENCOUNTER — Telehealth: Payer: Self-pay | Admitting: Adult Health

## 2022-03-26 ENCOUNTER — Other Ambulatory Visit: Payer: Self-pay | Admitting: Adult Health

## 2022-03-26 DIAGNOSIS — I7121 Aneurysm of the ascending aorta, without rupture: Secondary | ICD-10-CM

## 2022-03-26 DIAGNOSIS — I2510 Atherosclerotic heart disease of native coronary artery without angina pectoris: Secondary | ICD-10-CM

## 2022-03-26 NOTE — Telephone Encounter (Signed)
Based on his imaging results he is wanting to be referred to Cardiology and see Dr. Jenkins Rouge  ?

## 2022-04-05 ENCOUNTER — Ambulatory Visit: Payer: Medicare Other | Admitting: Gastroenterology

## 2022-04-05 ENCOUNTER — Encounter: Payer: Self-pay | Admitting: Gastroenterology

## 2022-04-05 VITALS — BP 124/70 | HR 64 | Ht 69.75 in | Wt 221.4 lb

## 2022-04-05 DIAGNOSIS — R140 Abdominal distension (gaseous): Secondary | ICD-10-CM | POA: Diagnosis not present

## 2022-04-05 DIAGNOSIS — K5909 Other constipation: Secondary | ICD-10-CM

## 2022-04-05 NOTE — Progress Notes (Signed)
[facility] Progress Note  Chief Complaint: Bloating and constipation  Subjective  History: From my office consult note January 2022: "This is a very pleasant 71 year old man referred by primary care for positive Cologuard test.  It was done in July 2019, and Case says that between [place] and caring for his elderly mother who eventually passed away in late 02-22-2019, he never did get a colonoscopy done.  He had not had a prior Cologuard or colonoscopy before the Feb 21, 2018 test.   In the last month or 2 has had some intermittent right-sided abdominal pain more toward the lower quadrant.  He has not noticed any definite triggers or relieving factors, and it does not seem to radiate.  He has a least 5 years of heartburn requiring daily acid suppression that seems to work well.  He does not have dysphagia odynophagia, but he does have bloating and early satiety for about the last year.  He has lately started taking MiraLAX a capful daily and has had some relief of the constipation, where previously he would go 2-3 times a week.  He denies rectal bleeding and has no family history of colorectal cancer to his knowledge." MiraLAX recommended in procedure scheduled at that time  EGD findings February 2022: The larynx was normal. - The esophagus was normal. LES somewhat patulous. - Diffuse congested mucosa was found in the gastric body. Several biopsies were obtained in the gastric body and in the gastric antrum with cold forceps for histology. - The cardia and gastric fundus were normal on retroflexion. (Hill grade 3) - The examined duodenum was normal  Colonoscopy findings February 2022: The perianal and digital rectal examinations were normal. - Two semi-sessile polyps were found in the transverse colon. The polyps were 2 to 5 mm in size. These polyps were removed with a cold snare. Resection was complete, but the polyp tissue was only partially retrieved. - Multiple diverticula were found in  the left colon. - The exam was otherwise without abnormality on direct and retroflexion views. _______________________________________________________________  Steven Bray was here to see me for ongoing trouble with constipation and bloating.  His stools are relatively small and pasty and he feels incompletely evacuated.  He spends a lot of time cleaning after BMs because of the consistency.  He feels generalized abdominal bloating most of the time, worse after meals.  He denies early satiety, nausea vomiting or weight loss.  He has developed peripheral edema and started furosemide in the last few months.  He also had a screening chest CT scan as noted below and was found to have atherosclerosis with dilatation of the thoracic aorta, and will see Dr. Johnsie Cancel in consultation next month.  Querying him about his diet, he rarely consumes fruits or vegetables.  He might have a sausage biscuit in the morning, perhaps a sandwich for lunch or dinner and often gets takeout such as chicken and rice from a local Asian restaurant.  ROS: Cardiovascular:  no chest pain Respiratory: no dyspnea Denies dysuria or urinary retention Remainder systems negative except as above The patient's Past Medical, Family and Social History were reviewed and are on file in the EMR.  Objective:  Med list reviewed  Current Outpatient Medications:    aspirin EC 81 MG tablet, Take 162 mg by mouth daily., Disp: , Rfl:    atenolol (TENORMIN) 25 MG tablet, TAKE 1 TABLET BY MOUTH  TWICE DAILY FOR BLOOD  PRESSURE, Disp: 180 tablet, Rfl: 3   atorvastatin (LIPITOR) 40  MG tablet, TAKE 1 TABLET BY MOUTH 4  TIMES A WEEK ON MONDAY ,  WEDNESDAY , FRIDAY AND  SATURDAY FOR CHOLESTEROL, Disp: 52 tablet, Rfl: 3   Cyanocobalamin (VITAMIN B 12) 500 MCG TABS, Take 1 tablet by mouth daily., Disp: , Rfl:    furosemide (LASIX) 40 MG tablet, Take  1 tablet  1  or 2 x /day  for BP and Fluid Retention / Ankle Swelling, Disp: 180 tablet, Rfl: 0   meloxicam  (MOBIC) 15 MG tablet, Take  1/2 to 1 tablet  Daily  with Food  for Pain & Inflammation                                       /                             TAKE                           BY                       MOUTH, Disp: 90 tablet, Rfl: 3   pantoprazole (PROTONIX) 40 MG tablet, TAKE 1 TABLET BY MOUTH  DAILY TO PREVENT HEARTBURN  AND INDIGESTION, Disp: 90 tablet, Rfl: 3   tadalafil (CIALIS) 20 MG tablet, Take 1/2 to 1 tablet every 2 to 3 days as needed for XXXX, Disp: 30 tablet, Rfl: 12   tamsulosin (FLOMAX) 0.4 MG CAPS capsule, TAKE 1 CAPSULE BY MOUTH  DAILY FOR PROSTATE, Disp: 90 capsule, Rfl: 3   VITAMIN D PO, Take 10,000 Units by mouth 4 (four) times a week., Disp: , Rfl:    Vital signs in last 24 hrs: Vitals:   04/05/22 0927  BP: 124/70  Pulse: 64   Wt Readings from Last 3 Encounters:  04/05/22 221 lb 6 oz (100.4 kg)  03/13/22 224 lb (101.6 kg)  02/21/22 224 lb (101.6 kg)    Physical Exam  Pleasant and conversational.  Well-appearing HEENT: sclera anicteric, oral mucosa moist without lesions Neck: supple, no thyromegaly, JVD or lymphadenopathy Cardiac: Regular without appreciable murmur, mild bilateral pretibial edema Pulm: clear to auscultation bilaterally, normal RR and effort noted Abdomen: soft, obese, tenderness, with active bowel sounds. No guarding or palpable hepatosplenomegaly.  (Limited by body habitus)-no bruit Skin; warm and dry, no jaundice or rash  Labs:     Latest Ref Rng & Units 02/21/2022   12:35 PM 11/14/2021   12:02 PM 11/08/2021   10:46 AM  CMP  Glucose 65 - 99 mg/dL 102   107   129    BUN 7 - 25 mg/dL '17   21   14    '$ Creatinine 0.70 - 1.35 mg/dL 1.15   1.08   1.18    Sodium 135 - 146 mmol/L 142   139   137    Potassium 3.5 - 5.3 mmol/L 3.8   4.0   3.6    Chloride 98 - 110 mmol/L 104   101   100    CO2 20 - 32 mmol/L 28   32   28    Calcium 8.6 - 10.3 mg/dL 9.6   9.5   9.6    Total Protein 6.1 - 8.1 g/dL 7.0    6.8  Total Bilirubin 0.2 - 1.2  mg/dL 0.6    0.8    AST 10 - 35 U/L 15    19    ALT 9 - 46 U/L 11    15        Latest Ref Rng & Units 02/21/2022   12:35 PM 11/08/2021   10:46 AM 05/23/2021    4:05 PM  CBC  WBC 3.8 - 10.8 Thousand/uL 4.9   4.6   6.1    Hemoglobin 13.2 - 17.1 g/dL 15.2   15.3   17.0    Hematocrit 38.5 - 50.0 % 44.6   45.2   49.9    Platelets 140 - 400 Thousand/uL 230   225   232      ___________________________________________ Radiologic studies:  CLINICAL DATA:  71 year old asymptomatic male former smoker with 52 pack-year smoking history, quit smoking 6 months prior.   EXAM: CT CHEST WITHOUT CONTRAST LOW-DOSE FOR LUNG CANCER SCREENING   TECHNIQUE: Multidetector CT imaging of the chest was performed following the standard protocol without IV contrast.   RADIATION DOSE REDUCTION: This exam was performed according to the departmental dose-optimization program which includes automated exposure control, adjustment of the mA and/or kV according to patient size and/or use of iterative reconstruction technique.   COMPARISON:  None Available.   FINDINGS: Cardiovascular: Normal heart size. No significant pericardial effusion/thickening. Three-vessel coronary atherosclerosis. Atherosclerotic thoracic aorta with dilated 4.4 cm ascending thoracic aorta. Normal caliber pulmonary arteries.   Mediastinum/Nodes: No discrete thyroid nodules. Unremarkable esophagus. No pathologically enlarged axillary, mediastinal or hilar lymph nodes, noting limited sensitivity for the detection of hilar adenopathy on this noncontrast study.   Lungs/Pleura: No pneumothorax. No pleural effusion. Mild centrilobular emphysema with diffuse bronchial wall thickening. No acute consolidative airspace disease or lung masses. A few scattered tiny solid pulmonary nodules in both lungs, largest 2.8 mm in volume derived mean diameter in the anterior apical right upper lobe (series 3/image 38).   Upper abdomen: A few scattered  subcentimeter hypodense liver lesions are too small to characterize. Simple 1.3 cm anterior left liver cyst. Exophytic 3.6 cm simple interpolar lateral right renal cyst, for which no follow-up is recommended.   Musculoskeletal: No aggressive appearing focal osseous lesions. Mild thoracic spondylosis.   IMPRESSION: 1. Lung-RADS 2, benign appearance or behavior. Continue annual screening with low-dose chest CT without contrast in 12 months. 2. Dilated 4.4 cm ascending thoracic aorta, which can be reassessed on follow-up screening chest CT in 12 months. 3. Three-vessel coronary atherosclerosis. 4. Aortic Atherosclerosis (ICD10-I70.0) and Emphysema (ICD10-J43.9).     Electronically Signed   By: Ilona Sorrel M.D.   On: 03/20/2022 15:06  ____________________________________________ Other: Surgical [P], colon, transverse, polyp (2) - TUBULAR ADENOMA (TWO). - NO HIGH GRADE DYSPLASIA OR CARCINOMA. 2. Surgical [P], gastric antrum - ANTRAL MUCOSA WITH SLIGHT CHRONIC INFLAMMATION. [place] NEGATIVE FOR HELICOBACTER PYLORI. - NO INTESTINAL METAPLASIA, DYSPLASIA OR CARCINOMA. 3. Surgical [P], gastric body nodular area  - GASTRIC OXYNTIC MUCOSA WITH FOCAL SLIGHT HYPEREMIA. - WARTHIN-STARRY NEGATIVE FOR HELICOBACTER PYLORI. - NO INTESTINAL METAPLASIA, DYSPLASIA OR CARCINOMA.  _____________________________________________ Assessment & Plan  Assessment: Encounter Diagnoses  Name Primary?   Chronic constipation Yes   Abdominal bloating    Chronic functional constipation, insufficient dietary fiber and physical activity. I believe this is contributing to the bloating.  He feels his abdomen is more distended in recent months.  No clinical exam or lab findings to suggest cirrhosis that might lead to ascites. He will  be seeing cardiology next month, and I suspect they are likely to do an echocardiogram and a CT angiogram of the abdomen and pelvis.  If so, that should suffice for  abdominal imaging to rule out ascites or malignancy.  Plan: I will communicate with cardiology about this so I can also get her copy of the CT report if that is done. In the meantime, written bloating and gas dietary guidelines given.  Half a capful of MiraLAX a day (he admits he never tried it last year) and a tablespoon of Citrucel in glass of water daily.  Try to increase dietary fiber intake and physical activity.  32 minutes were spent on this encounter (including chart review, history/exam, counseling/coordination of care, and documentation) > 50% of that time was spent on counseling and coordination of care.   Nelida Meuse III

## 2022-04-05 NOTE — Patient Instructions (Signed)
If you are age 69 or older, your body mass index should be between 23-30. Your Body mass index is 31.99 kg/m. If this is out of the aforementioned range listed, please consider follow up with your Primary Care Provider.  If you are age 22 or younger, your body mass index should be between 19-25. Your Body mass index is 31.99 kg/m. If this is out of the aformentioned range listed, please consider follow up with your Primary Care Provider.   ________________________________________________________  The [facility] providers would like to encourage you to use [facility] to communicate with providers for non-urgent requests or questions.  Due to long hold times on the telephone, sending your provider a message by [facility] may be a faster and more efficient way to get a response.  Please allow 48 business hours for a response.  Please remember that this is for non-urgent requests.  _______________________________________________________  _______________________________________________________  Food Guidelines for those with chronic digestive trouble:  Many people have difficulty digesting certain foods, causing a variety of distressing and embarrassing symptoms such as abdominal pain, bloating and gas.  These foods may need to be avoided or consumed in small amounts.  Here are some tips that might be helpful for you.  1.   Lactose intolerance is the difficulty or complete inability to digest lactose, the natural sugar in milk and anything made from milk.  This condition is harmless, common, and can begin any time during life.  Some people can digest a modest amount of lactose while others cannot tolerate any.  Also, not all dairy products contain equal amounts of lactose.  For example, hard cheeses such as parmesan have less lactose than soft cheeses such as cheddar.  Yogurt has less lactose than milk or cheese.  Many packaged foods (even many brands of bread) have milk, so read ingredient lists carefully.   It is difficult to test for lactose intolerance, so just try avoiding lactose as much as possible for a week and see what happens with your symptoms.  If you seem to be lactose intolerant, the best plan is to avoid it (but make sure you get calcium from another source).  The next best thing is to use lactase enzyme supplements, available over the counter everywhere.  Just know that many lactose intolerant people need to take several tablets with each serving of dairy to avoid symptoms.  Lastly, a lot of restaurant food is made with milk or butter.  Many are things you might not suspect, such as mashed potatoes, rice and pasta (cooked with butter) and "grilled" items.  If you are lactose intolerant, it never hurts to ask your server what has milk or butter.  2.   Fiber is an important part of your diet, but not all fiber is well-tolerated.  Insoluble fiber such as bran is often consumed by normal gut bacteria and converted into gas.  Soluble fiber such as oats, squash, carrots and green beans are typically tolerated better.  3.   Some types of carbohydrates can be poorly digested.  Examples include: fructose (apples, cherries, pears, raisins and other dried fruits), fructans (onions, zucchini, large amounts of wheat), sorbitol/mannitol/xylitol and sucralose/Splenda (common artificial sweeteners), and raffinose (lentils, broccoli, cabbage, asparagus, brussel sprouts, many types of beans).  Do a Development worker, community for [employer] and you will find helpful information. Beano, a dietary supplement, will often help with raffinose-containing foods.  As with lactase tablets, you may need several per serving.  4.   Whenever possible,  avoid processed food&meats and chemical additives.  High fructose corn syrup, a common sweetener, may be difficult to digest.  Eggs and soy (comes from the soybean, and added to many foods now) are other common bloating/gassy foods.  5.  Regarding gluten:  gluten is a protein mainly found in  wheat, but also rye and barley.  There is a condition called celiac sprue, which is an inflammatory reaction in the small intestine causing a variety of digestive symptoms.  Blood testing is highly reliable to look for this condition, and sometimes upper endoscopy with small bowel biopsies may be necessary to make the diagnosis.  Many patients who test negative for celiac sprue report improvement in their digestive symptoms when they switch to a gluten-free diet.  However, in these "non-celiac gluten sensitive" patients, the true role of gluten in their symptoms is unclear.  Reducing carbohydrates in general may decrease the gas and bloating caused when gut bacteria consume carbs. Also, some of these patients may actually be intolerant of the baker's yeast in bread products rather than the gluten.  Flatbread and other reduced yeast breads might therefore be tolerated.  There is no specific testing available for most food intolerances, which are discovered mainly by dietary elimination.  Please do not embark on a gluten free diet unless directed by your doctor, as it is highly restrictive, and may lead to nutritional deficiencies if not carefully monitored.  Lastly, beware of internet claims offering "personalized" tests for food intolerances.  Such testing has no reliable scientific evidence to support its reliability and correlation to symptoms.    6.  The best advice is old advice, especially for those with chronic digestive trouble - try to eat "clean".  Balanced diet, avoid processed food, plenty of fruits and vegetables, cut down the sugar, minimal alcohol, avoid tobacco. Make time to care for yourself, get enough sleep, exercise when you can, reduce stress.  Your guts will thank you for it.   - Dr. Herma [facility]  ____________________________________________________________   Start Citrucel fiber, 1 tablespoon daily. Miralax 1 / 2 capful daily.  It was a pleasure to see you  today!  Thank you for trusting me with your gastrointestinal care!

## 2022-04-18 NOTE — Progress Notes (Signed)
CARDIOLOGY CONSULT NOTE       Patient ID: Steven Bray. MRN: [id number] DOB/AGE: 06/04/1951 71 y.o.  Admit date: (Not on file) Referring Physician: Melford Aase Primary Physician: Susy Frizzle, MD Primary Cardiologist: New Reason for Consultation: Aortic Aneurysm/CAD  Active Problems:   * No active hospital problems. *   HPI:  71 y.o. referred by Dr Melford Aase for aortic aneurysm and CAD. Former smoker quit 09/2021 Lung cancer CT 03/20/22 showed 4.4 cm ascending thoracic aneurysm and 3 vessel coronary calcium History of HTN, HLD, CVA Pre DM and GERD.  No clinical CAD. 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[employer] including [place] I have seen his cousin and sister in past and he is related to [employer] a riding friend of mine   He has chronic LE edema Denies chest pain despite extensive calcium in cors on CT   ROS All other systems reviewed and negative except as noted above  Past Medical History:  Diagnosis Date   Arthritis    ED (erectile dysfunction)    GERD (gastroesophageal reflux disease)    Hyperlipidemia    Hypertension    Pre-diabetes    Stroke ([place])    TIA - around age 27   TIA (transient ischemic attack)    Vitamin D deficiency     Family History  Problem Relation Age of Onset   Cancer Mother        LYMPHOMA   Hyperlipidemia Mother    Hypertension Mother    Diabetes Father    Hypertension Father    Stroke Father    Kidney disease Father    Diabetes Sister    Drug abuse Son        DIED FROM OD   Early death Daughter    Prostate cancer Maternal Uncle    Colon cancer Neg Hx    Esophageal cancer Neg Hx    Pancreatic cancer Neg Hx    Rectal cancer Neg Hx    Stomach cancer Neg Hx     Social History   Socioeconomic History    Marital status: Single    Spouse name: Not on file   Number of children: Not on file   Years of education: Not on file   Highest education level: Not on file  Occupational History   Not on file  Tobacco Use   Smoking status: Former    Packs/day: 0.65    Years: 52.00    Total pack years: 33.80    Types: Cigarettes    Start date: 23    Quit date: 09/2021    Years since quitting: 0.6   Smokeless tobacco: Never  Vaping Use   Vaping Use: Never used  Substance and Sexual Activity   Alcohol use: No    Alcohol/week: 0.0 standard drinks of alcohol   Drug use: No   Sexual activity: Yes    Birth control/protection: None  Other Topics Concern   Not on file  Social History Narrative   Not on file   Social Determinants of Health   Financial Resource Strain: Not on file  Food Insecurity: Not on file  Transportation Needs: Not on file  Physical Activity: Not on file  Stress: Not on  file  Social Connections: Not on file  Intimate Partner Violence: Not on file    Past Surgical History:  Procedure Laterality Date   HERNIA REPAIR Right    INGUINAL      Current Outpatient Medications:    aspirin EC 81 MG tablet, Take 162 mg by mouth daily., Disp: , Rfl:    atenolol (TENORMIN) 25 MG tablet, TAKE 1 TABLET BY MOUTH  TWICE DAILY FOR BLOOD  PRESSURE, Disp: 180 tablet, Rfl: 3   atorvastatin (LIPITOR) 40 MG tablet, TAKE 1 TABLET BY MOUTH 4  TIMES A WEEK ON MONDAY ,  WEDNESDAY , FRIDAY AND  SATURDAY FOR CHOLESTEROL, Disp: 52 tablet, Rfl: 3   Cyanocobalamin (VITAMIN B 12) 500 MCG TABS, Take 1 tablet by mouth daily., Disp: , Rfl:    furosemide (LASIX) 40 MG tablet, Take  1 tablet  1  or 2 x /day  for BP and Fluid Retention / Ankle Swelling, Disp: 180 tablet, Rfl: 0   meloxicam (MOBIC) 15 MG tablet, Take  1/2 to 1 tablet  Daily  with Food  for Pain & Inflammation                                       /                             TAKE                           BY                       MOUTH,  Disp: 90 tablet, Rfl: 3   pantoprazole (PROTONIX) 40 MG tablet, TAKE 1 TABLET BY MOUTH  DAILY TO PREVENT HEARTBURN  AND INDIGESTION, Disp: 90 tablet, Rfl: 3   tadalafil (CIALIS) 20 MG tablet, Take 1/2 to 1 tablet every 2 to 3 days as needed for XXXX, Disp: 30 tablet, Rfl: 12   tamsulosin (FLOMAX) 0.4 MG CAPS capsule, TAKE 1 CAPSULE BY MOUTH  DAILY FOR PROSTATE, Disp: 90 capsule, Rfl: 3   VITAMIN D PO, Take 10,000 Units by mouth 4 (four) times a week., Disp: , Rfl:     Physical Exam: Blood pressure 122/82, pulse 68, height '5\' 9"'$  (1.753 m), weight 221 lb (100.2 kg), SpO2 100 %.    Affect appropriate Healthy:  appears stated age 71: normal Neck supple with no adenopathy JVP normal no bruits no thyromegaly Lungs clear with no wheezing and good diaphragmatic motion Heart:  S1/S2 no murmur, no rub, gallop or click PMI normal Abdomen: benighn, BS positve, no tenderness, no AAA no bruit.  No HSM or HJR Distal pulses intact with no bruits Trace LE edema Neuro non-focal Skin warm and dry No muscular weakness   Labs:   Lab Results  Component Value Date   WBC 4.9 02/21/2022   HGB 15.2 02/21/2022   HCT 44.6 02/21/2022   MCV 95.1 02/21/2022   PLT 230 02/21/2022   No results for input(s): "NA", "K", "CL", "CO2", "BUN", "CREATININE", "CALCIUM", "PROT", "BILITOT", "ALKPHOS", "ALT", "AST", "GLUCOSE" in the last 168 hours.  Invalid input(s): "LABALBU" Lab Results  Component Value Date   CKTOTAL 119 03/13/2022    Lab Results  Component Value Date   CHOL 131 02/21/2022   CHOL 136  11/08/2021   CHOL 149 05/23/2021   Lab Results  Component Value Date   HDL 38 (L) 02/21/2022   HDL 37 (L) 11/08/2021   HDL 31 (L) 05/23/2021   Lab Results  Component Value Date   LDLCALC 71 02/21/2022   LDLCALC 74 11/08/2021   LDLCALC 90 05/23/2021   Lab Results  Component Value Date   TRIG 136 02/21/2022   TRIG 171 (H) 11/08/2021   TRIG 186 (H) 05/23/2021   Lab Results  Component Value  Date   CHOLHDL 3.4 02/21/2022   CHOLHDL 3.7 11/08/2021   CHOLHDL 4.8 05/23/2021   No results found for: "LDLDIRECT"    Radiology: No results found.  EKG: SR rate 55 normal 11/14/21    ASSESSMENT AND PLAN:   CAD:  sub clinical seen on lung cancer CT ECG normal Dense LM/LAD and 3 vessel calcium.  Shared decision making feel PET/CT best option to risk stratify as will provide perfusion, blood flow and rest/stress EF evaluation ASA/statin and beta blocker HLD:  LDL 74 continue lipitor  HTN: with evidence of aortic aneurysm on beta blocker in good range  Aortic Aneurysm:  TTE to evaluate morphology of AV repeat CTA in a year  Venous Insufficiency :  continue lasix Discussed low sodium diet   PET/CT TTE  F/U in a year   Signed: Jenkins Rouge 04/23/2022, 12:56 PM

## 2022-04-23 ENCOUNTER — Other Ambulatory Visit: Payer: Self-pay

## 2022-04-23 ENCOUNTER — Encounter: Payer: Self-pay | Admitting: Cardiovascular Disease

## 2022-04-23 ENCOUNTER — Ambulatory Visit: Payer: Medicare Other | Admitting: Cardiovascular Disease

## 2022-04-23 VITALS — BP 122/82 | HR 68 | Ht 69.0 in | Wt 221.0 lb

## 2022-04-23 DIAGNOSIS — I10 Essential (primary) hypertension: Secondary | ICD-10-CM | POA: Diagnosis not present

## 2022-04-23 DIAGNOSIS — I2510 Atherosclerotic heart disease of native coronary artery without angina pectoris: Secondary | ICD-10-CM | POA: Diagnosis not present

## 2022-04-23 DIAGNOSIS — R072 Precordial pain: Secondary | ICD-10-CM

## 2022-04-23 DIAGNOSIS — E782 Mixed hyperlipidemia: Secondary | ICD-10-CM

## 2022-04-23 DIAGNOSIS — I712 Thoracic aortic aneurysm, without rupture, unspecified: Secondary | ICD-10-CM

## 2022-04-23 DIAGNOSIS — R609 Edema, unspecified: Secondary | ICD-10-CM

## 2022-04-23 NOTE — Progress Notes (Signed)
Placed order for attestation for Dr. Johnsie Cancel to sign.

## 2022-04-23 NOTE — Patient Instructions (Addendum)
Medication Instructions:  Your physician recommends that you continue on your current medications as directed. Please refer to the Current Medication list given to you today.  *If you need a refill on your cardiac medications before your next appointment, please call your pharmacy*  Lab Work: If you have labs (blood work) drawn today and your tests are completely normal, you will receive your results only by: [facility] (if you have MyChart) OR A paper copy in the mail If you have any lab test that is abnormal or we need to change your treatment, we will call you to review the results.  Testing/Procedures: Your physician has requested that you have cardiac PET/CT.  Your physician has requested that you have an echocardiogram. Echocardiography is a painless test that uses sound waves to create images of your heart. It provides your doctor with information about the size and shape of your heart and how well your heart's chambers and valves are working. This procedure takes approximately one hour. There are no restrictions for this procedure.  Follow-Up: At [facility], you and your health needs are our priority.  As part of our continuing mission to provide you with exceptional heart care, we have created designated Provider Care Teams.  These Care Teams include your primary Cardiologist (physician) and Advanced Practice Providers (APPs -  Physician Assistants and Nurse Practitioners) who all work together to provide you with the care you need, when you need it.  We recommend signing up for the patient portal called "MyChart".  Sign up information is provided on this After Visit Summary.  MyChart is used to connect with patients for Virtual Visits (Telemedicine).  Patients are able to view lab/test results, encounter notes, upcoming appointments, etc.  Non-urgent messages can be sent to your provider as well.   To learn more about what you can do with MyChart, go to [email or web address].     Your next appointment:   6 month(s)  The format for your next appointment:   In Person  Provider:   Jenkins Rouge, MD {  Other Instructions How to Prepare for Your Cardiac PET/CT Stress Test:  1. Please do not take these medications before your test:   Atenolol and Cialis Medications that may interfere with the cardiac pharmacological stress agent the day of the exam. (Erectile dysfunction medication should be held for at least 72 hrs prior to test) Your remaining medications may be taken with water.  2. Nothing to eat or drink, except water, 3 hours prior to arrival time.   NO caffeine/decaffeinated products, or chocolate 12 hours prior to arrival.  3. NO perfume, cologne or lotion  4. Total time is 1 to 2 hours; you may want to bring reading material for the waiting time.  5. Please report to Admitting at the [facility] Entrance 60 minutes early for your test.  [address], [place] [address]  In preparation for your appointment, medication and supplies will be purchased.  Appointment availability is limited, so if you need to cancel or reschedule, please call the Radiology Department at [phone]  24 hours in advance to avoid a cancellation fee of $100.00  What to Expect After you Arrive:  Once you arrive and check in for your appointment, you will be taken to a preparation room within the Radiology Department.  A technologist or Nurse will obtain your medical history, verify that you are correctly prepped for the exam, and explain the procedure.  Afterwards,  an IV  will be started in your arm and electrodes will be placed on your skin for EKG monitoring during the stress portion of the exam. Then you will be escorted to the PET/CT scanner.  There, staff will get you positioned on the scanner and obtain a blood pressure and EKG.  During the exam, you will continue to be connected to the EKG and blood pressure machines.  A small, safe amount of a  radioactive tracer will be injected in your IV to obtain a series of pictures of your heart along with an injection of a stress agent.    After your Exam:  It is recommended that you eat a meal and drink a caffeinated beverage to counter act any effects of the stress agent.  Drink plenty of fluids for the remainder of the day and urinate frequently for the first couple of hours after the exam.  Your doctor will inform you of your test results within 7-10 business days.  For questions about your test or how to prepare for your test, please call: Marchia Bond, Cardiac Imaging Nurse Navigator  Gordy Clement, Cardiac Imaging Nurse Navigator Office: [phone]   Important Information About Sugar

## 2022-04-25 DIAGNOSIS — D225 Melanocytic nevi of trunk: Secondary | ICD-10-CM | POA: Diagnosis not present

## 2022-04-25 DIAGNOSIS — Z08 Encounter for follow-up examination after completed treatment for malignant neoplasm: Secondary | ICD-10-CM | POA: Diagnosis not present

## 2022-04-25 DIAGNOSIS — C44622 Squamous cell carcinoma of skin of right upper limb, including shoulder: Secondary | ICD-10-CM | POA: Diagnosis not present

## 2022-04-25 DIAGNOSIS — D485 Neoplasm of uncertain behavior of skin: Secondary | ICD-10-CM | POA: Diagnosis not present

## 2022-04-25 DIAGNOSIS — Z85828 Personal history of other malignant neoplasm of skin: Secondary | ICD-10-CM | POA: Diagnosis not present

## 2022-04-25 DIAGNOSIS — L570 Actinic keratosis: Secondary | ICD-10-CM | POA: Diagnosis not present

## 2022-04-25 DIAGNOSIS — L821 Other seborrheic keratosis: Secondary | ICD-10-CM | POA: Diagnosis not present

## 2022-04-25 DIAGNOSIS — L814 Other melanin hyperpigmentation: Secondary | ICD-10-CM | POA: Diagnosis not present

## 2022-04-29 ENCOUNTER — Other Ambulatory Visit: Payer: Self-pay | Admitting: Internal Medicine

## 2022-04-29 DIAGNOSIS — I872 Venous insufficiency (chronic) (peripheral): Secondary | ICD-10-CM

## 2022-05-07 ENCOUNTER — Ambulatory Visit (HOSPITAL_COMMUNITY): Payer: Medicare Other | Attending: Internal Medicine

## 2022-05-07 DIAGNOSIS — I712 Thoracic aortic aneurysm, without rupture, unspecified: Secondary | ICD-10-CM

## 2022-05-07 DIAGNOSIS — R600 Localized edema: Secondary | ICD-10-CM

## 2022-05-07 DIAGNOSIS — R072 Precordial pain: Secondary | ICD-10-CM

## 2022-05-07 DIAGNOSIS — E782 Mixed hyperlipidemia: Secondary | ICD-10-CM

## 2022-05-07 DIAGNOSIS — I2510 Atherosclerotic heart disease of native coronary artery without angina pectoris: Secondary | ICD-10-CM

## 2022-05-07 DIAGNOSIS — R609 Edema, unspecified: Secondary | ICD-10-CM | POA: Insufficient documentation

## 2022-05-07 DIAGNOSIS — I10 Essential (primary) hypertension: Secondary | ICD-10-CM | POA: Diagnosis not present

## 2022-05-08 ENCOUNTER — Telehealth: Payer: Self-pay | Admitting: Cardiovascular Disease

## 2022-05-08 LAB — ECHOCARDIOGRAM COMPLETE
Area-P 1/2: 3.17 cm2
P 1/2 time: 582 msec
S' Lateral: 3.2 cm

## 2022-05-08 NOTE — Telephone Encounter (Signed)
Pt returning nurse's call. Please advise

## 2022-05-09 ENCOUNTER — Ambulatory Visit (INDEPENDENT_AMBULATORY_CARE_PROVIDER_SITE_OTHER): Payer: Medicare Other | Admitting: Family Medicine

## 2022-05-09 VITALS — BP 118/78 | HR 60 | Temp 97.7°F | Ht 69.0 in | Wt 220.0 lb

## 2022-05-09 DIAGNOSIS — Z8673 Personal history of transient ischemic attack (TIA), and cerebral infarction without residual deficits: Secondary | ICD-10-CM | POA: Diagnosis not present

## 2022-05-09 DIAGNOSIS — I712 Thoracic aortic aneurysm, without rupture, unspecified: Secondary | ICD-10-CM | POA: Diagnosis not present

## 2022-05-09 DIAGNOSIS — Z87891 Personal history of nicotine dependence: Secondary | ICD-10-CM | POA: Diagnosis not present

## 2022-05-09 DIAGNOSIS — I700 Atherosclerosis of aorta: Secondary | ICD-10-CM | POA: Diagnosis not present

## 2022-05-09 DIAGNOSIS — I2510 Atherosclerotic heart disease of native coronary artery without angina pectoris: Secondary | ICD-10-CM | POA: Diagnosis not present

## 2022-05-09 MED ORDER — POTASSIUM CHLORIDE CRYS ER 10 MEQ PO TBCR: 10.0000 meq | EXTENDED_RELEASE_TABLET | Freq: Every day | ORAL | 3 refills | Status: DC

## 2022-05-09 NOTE — Progress Notes (Signed)
Subjective:    Patient ID: Steven Bray., male    DOB: [birth year], 71 y.o.   MRN: [id number]  HPI Patient is a very pleasant 71 year old Caucasian gentleman here today to establish care.  He has a history of prediabetes.  He also has a history of stroke that occurred in his 71s.  For that reason he takes an aspirin every day along with atorvastatin.  His most recent LDL cholesterol was 71 in April.  He denies any myalgias on the atorvastatin.  I asked him to try to start taking it every day if he can tolerate it.  He is also on meloxicam for joint pain.  I explained the risk of GI toxicity from meloxicam.  At the present time he denies any abdominal pain nausea or melena.  I asked him to be cautious taking this medication with aspirin and if he develops any stomach discomfort I want him to stop immediately.  He has a history of high blood pressure however his blood pressure today is well controlled on atenolol.  He takes furosemide for leg swelling due to chronic venous insufficiency.  He had an echocardiogram that showed an ejection fraction of 29% and mild diastolic dysfunction.  He does have a remote history of smoking but he had a CT scan of the lungs performed in April of this year that showed no evidence of lung cancer but did show pulmonary nodules, calcifications in the coronary arteries as well as an thoracic aortic aneurysm.  This is being monitored annually by his cardiologist. Past Medical History:  Diagnosis Date   Arthritis    ED (erectile dysfunction)    GERD (gastroesophageal reflux disease)    Hyperlipidemia    Hypertension    Pre-diabetes    Stroke ([place])    TIA - around age 20   TIA (transient ischemic attack)    Vitamin D deficiency    Past Surgical History:  Procedure Laterality Date   HERNIA REPAIR Right    INGUINAL   Current Outpatient Medications on File Prior to Visit  Medication Sig Dispense Refill   aspirin EC 81 MG tablet Take 162 mg by mouth daily.      atenolol (TENORMIN) 25 MG tablet TAKE 1 TABLET BY MOUTH  TWICE DAILY FOR BLOOD  PRESSURE 180 tablet 3   atorvastatin (LIPITOR) 40 MG tablet TAKE 1 TABLET BY MOUTH 4  TIMES A WEEK ON MONDAY ,  WEDNESDAY , FRIDAY AND  SATURDAY FOR CHOLESTEROL 52 tablet 3   Cyanocobalamin (VITAMIN B 12) 500 MCG TABS Take 1 tablet by mouth daily.     furosemide (LASIX) 40 MG tablet TAKE 1 TABLET BY MOUTH 1 OR 2 TIMES A DAY FOR BLOOD PRESSURE AND FLUID RETENTION Hyman Hopes SWELLING 180 tablet 0   meloxicam (MOBIC) 15 MG tablet Take  1/2 to 1 tablet  Daily  with Food  for Pain & Inflammation                                       /                             TAKE                           BY  MOUTH 90 tablet 3   pantoprazole (PROTONIX) 40 MG tablet TAKE 1 TABLET BY MOUTH  DAILY TO PREVENT HEARTBURN  AND INDIGESTION 90 tablet 3   tadalafil (CIALIS) 20 MG tablet Take 1/2 to 1 tablet every 2 to 3 days as needed for XXXX 30 tablet 12   tamsulosin (FLOMAX) 0.4 MG CAPS capsule TAKE 1 CAPSULE BY MOUTH  DAILY FOR PROSTATE 90 capsule 3   VITAMIN D PO Take 10,000 Units by mouth 4 (four) times a week.     No current facility-administered medications on file prior to visit.   Allergies  Allergen Reactions   Sulfa Antibiotics Nausea Only   Social History   Socioeconomic History   Marital status: Single    Spouse name: Not on file   Number of children: Not on file   Years of education: Not on file   Highest education level: Not on file  Occupational History   Not on file  Tobacco Use   Smoking status: Former    Packs/day: 0.65    Years: 52.00    Total pack years: 33.80    Types: Cigarettes    Start date: 25    Quit date: 09/2021    Years since quitting: 0.6   Smokeless tobacco: Never  Vaping Use   Vaping Use: Never used  Substance and Sexual Activity   Alcohol use: No    Alcohol/week: 0.0 standard drinks of alcohol   Drug use: No   Sexual activity: Yes    Birth control/protection: None   Other Topics Concern   Not on file  Social History Narrative   Not on file   Social Determinants of Health   Financial Resource Strain: Not on file  Food Insecurity: Not on file  Transportation Needs: Not on file  Physical Activity: Not on file  Stress: Not on file  Social Connections: Not on file  Intimate Partner Violence: Not on file   Family History  Problem Relation Age of Onset   Cancer Mother        LYMPHOMA   Hyperlipidemia Mother    Hypertension Mother    Diabetes Father    Hypertension Father    Stroke Father    Kidney disease Father    Diabetes Sister    Drug abuse Son        DIED FROM OD   Early death Daughter    Prostate cancer Maternal Uncle    Colon cancer Neg Hx    Esophageal cancer Neg Hx    Pancreatic cancer Neg Hx    Rectal cancer Neg Hx    Stomach cancer Neg Hx      Review of Systems     Objective:   Physical Exam Constitutional:      General: He is not in acute distress.    Appearance: Normal appearance. He is not ill-appearing, toxic-appearing or diaphoretic.  Eyes:     Extraocular Movements: Extraocular movements intact.     Conjunctiva/sclera: Conjunctivae normal.     Pupils: Pupils are equal, round, and reactive to light.  Cardiovascular:     Rate and Rhythm: Normal rate and regular rhythm.     Heart sounds: Normal heart sounds. No murmur heard.    No friction rub. No gallop.  Pulmonary:     Effort: Pulmonary effort is normal. No respiratory distress.     Breath sounds: Normal breath sounds. No stridor. No wheezing, rhonchi or rales.  Abdominal:     General: Abdomen is flat. Bowel  sounds are normal. There is no distension.     Palpations: Abdomen is soft.     Tenderness: There is no abdominal tenderness. There is no guarding or rebound.  Musculoskeletal:     Right lower leg: No edema.     Left lower leg: No edema.  Skin:    Findings: No rash.  Neurological:     General: No focal deficit present.     Mental Status: He is  alert and oriented to person, place, and time.     Cranial Nerves: No cranial nerve deficit.     Motor: No weakness.     Coordination: Coordination normal.     Gait: Gait normal.     Deep Tendon Reflexes: Reflexes normal.  Psychiatric:        Mood and Affect: Mood normal.        Behavior: Behavior normal.           Assessment & Plan:  Thoracic aortic atherosclerosis ([place]) - per CT 03/20/2022  Coronary artery calcification seen on CT scan  Thoracic aortic aneurysm without rupture, unspecified part [facility])  Former smoker (33.8 pack year hx, quit 09/2021)  History of CVA in adulthood Colonoscopy was performed in 2022.  It did show tubular adenomas.  He is due again in 5 years/2027.  He is due for a PSA in January.  He is due for annual monitoring of his thoracic aortic aneurysm next year as well as annual monitoring for lung cancer with a CT scan next year.  Return in January for complete physical exam.  Lab work was recently obtained and was outstanding.  The only change I made was asked the patient to try to take atorvastatin daily for stroke prevention.

## 2022-05-09 NOTE — Telephone Encounter (Signed)
The patient has been notified of the result and verbalized understanding.  All questions (if any) were answered. Michaelyn Barter, RN 05/09/2022 9:06 AM

## 2022-05-28 ENCOUNTER — Ambulatory Visit: Payer: Medicare Other | Admitting: Internal Medicine

## 2022-06-03 ENCOUNTER — Telehealth: Payer: Self-pay | Admitting: Cardiovascular Disease

## 2022-06-03 NOTE — Telephone Encounter (Signed)
Patient states he is returning a call from [facility] to discuss test results.

## 2022-06-03 NOTE — Telephone Encounter (Signed)
Called patient back about message. Patient was wondering what the plan was after his echo. Informed patient that we ordered a Cardiac PET/CT at his visit and we are waiting for the CT department to schedule him in the next couple of months. Informed patient that he has follow up with Dr. Johnsie Cancel in December. Patient verbalized understanding.

## 2022-06-28 ENCOUNTER — Other Ambulatory Visit: Payer: Self-pay | Admitting: Internal Medicine

## 2022-07-16 ENCOUNTER — Other Ambulatory Visit: Payer: Self-pay

## 2022-07-16 DIAGNOSIS — I10 Essential (primary) hypertension: Secondary | ICD-10-CM

## 2022-07-16 MED ORDER — ATENOLOL 25 MG PO TABS: ORAL_TABLET | ORAL | 0 refills | Status: DC

## 2022-07-26 ENCOUNTER — Other Ambulatory Visit: Payer: Self-pay | Admitting: Nurse Practitioner

## 2022-07-26 ENCOUNTER — Telehealth (HOSPITAL_COMMUNITY): Payer: Self-pay | Admitting: *Deleted

## 2022-07-26 DIAGNOSIS — I872 Venous insufficiency (chronic) (peripheral): Secondary | ICD-10-CM

## 2022-07-26 NOTE — Telephone Encounter (Signed)
Attempted to call patient regarding upcoming cardiac PET appointment. Left message on voicemail with name and callback number  Gordy Clement RN Navigator Cardiac Imaging [facility] [phone] Office [phone] Cell  Reminder for patient to withhold caffeine and ED medication prior to cardiac PET appt.

## 2022-07-26 NOTE — Telephone Encounter (Signed)
Reaching out to patient to offer assistance regarding upcoming cardiac imaging study; pt verbalizes understanding of appt date/time, parking situation and where to check in, Pre-test NPO status and verified current allergies; name and call back number provided for further questions should they arise  Steven Clement RN Navigator Cardiac Imaging [facility] [phone] office [phone] cell  Patient aware to avoid caffeine 12 hours prior to his cardiac PET scan.

## 2022-07-30 ENCOUNTER — Encounter (HOSPITAL_COMMUNITY)
Admission: RE | Admit: 2022-07-30 | Discharge: 2022-07-30 | Disposition: A | Discharge status: Home / Self Care | Payer: Medicare Other | Source: Ambulatory Visit | Attending: Cardiovascular Disease | Admitting: Cardiovascular Disease

## 2022-07-30 DIAGNOSIS — Z0389 Encounter for observation for other suspected diseases and conditions ruled out: Secondary | ICD-10-CM | POA: Diagnosis not present

## 2022-07-30 DIAGNOSIS — R072 Precordial pain: Secondary | ICD-10-CM | POA: Diagnosis not present

## 2022-07-30 MED ORDER — RUBIDIUM RB82 GENERATOR (RUBYFILL)
25.9600 | PACK | Freq: Once | INTRAVENOUS | Status: AC
  Administered 2022-07-30: 25.96 via INTRAVENOUS

## 2022-07-30 MED ORDER — REGADENOSON 0.4 MG/5ML IV SOLN: 0.4000 mg | Freq: Once | INTRAVENOUS | Status: AC

## 2022-07-30 MED ORDER — REGADENOSON 0.4 MG/5ML IV SOLN
INTRAVENOUS | Status: AC
  Administered 2022-07-30: 0.4 mg via INTRAVENOUS
  Filled 2022-07-30: qty 5

## 2022-07-30 NOTE — Progress Notes (Signed)
Patient presents for a cardiac PET stress test and tolerated procedure without incident. Patient maintained acceptable vital signs throughout the test and was offered caffeine after test.  Patient ambulated out of department with a steady gait.  

## 2022-07-31 ENCOUNTER — Other Ambulatory Visit: Payer: Self-pay | Admitting: Internal Medicine

## 2022-07-31 DIAGNOSIS — I10 Essential (primary) hypertension: Secondary | ICD-10-CM

## 2022-07-31 LAB — NM PET CT CARDIAC PERFUSION MULTI W/ABSOLUTE BLOODFLOW
MBFR: 2.01
Nuc Rest EF: 53 %
Nuc Stress EF: 64 %
Peak HR: 69 {beats}/min
Rest HR: 53 {beats}/min
Rest MBF: 0.73 ml/g/min
Rest Nuclear Isotope Dose: 26 mCi
ST Depression (mm): 0 mm
Stress MBF: 1.47 ml/g/min
Stress Nuclear Isotope Dose: 26 mCi
TID: 0.91

## 2022-08-26 ENCOUNTER — Ambulatory Visit: Payer: Medicare Other | Admitting: Nurse Practitioner

## 2022-08-28 ENCOUNTER — Ambulatory Visit: Payer: Medicare Other | Admitting: Nurse Practitioner

## 2022-09-26 ENCOUNTER — Other Ambulatory Visit: Payer: Self-pay | Admitting: Family Medicine

## 2022-09-26 ENCOUNTER — Telehealth: Payer: Self-pay

## 2022-09-26 MED ORDER — PREDNISONE 20 MG PO TABS: ORAL_TABLET | ORAL | 0 refills | Status: DC

## 2022-09-26 NOTE — Telephone Encounter (Signed)
Pt called with c/o chest congestion and cough. Pt asks if there is any way you would call in Prednisone for him at the [facility] on [address].

## 2022-10-18 NOTE — Progress Notes (Signed)
CARDIOLOGY CONSULT NOTE       Patient ID: Steven Bray. MRN: [id number] DOB/AGE: 1951-07-17 71 y.o.  Admit date: (Not on file) Referring Physician: Melford Aase Primary Physician: Susy Frizzle, MD Primary Cardiologist: Johnsie Cancel Reason for Consultation: Aortic Aneurysm/CAD  Active Problems:   * No active hospital problems. *   HPI:  71 y.o. referred by Dr Melford Aase for aortic aneurysm and CAD. 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  No clinical CAD. 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 [employer] including [place] I have seen his cousin and sister in past and he is related to [employer] a riding friend of mine   He has chronic LE edema Denies chest pain despite extensive calcium in cors on CT   TTE 05/03/22 EF 55-60% mild LVH mild AR with tri leaflet valve Ao 4.4 cm PET / CT  07/30/22 Normal perfusion stress EF 64% ESV/EDV normal BF Reserve low normal 2.01 coronary calcium known Normal study  He is cousin to [employer] one of my friends   ROS All other systems reviewed and negative except as noted above  Past Medical History:  Diagnosis Date   Arthritis    ED (erectile dysfunction)    GERD (gastroesophageal reflux disease)    Hyperlipidemia    Hypertension    Pre-diabetes    Stroke ([place])    TIA - around age 30   TIA (transient ischemic attack)    Vitamin D deficiency     Family History  Problem Relation Age of Onset   Cancer Mother        LYMPHOMA   Hyperlipidemia Mother    Hypertension Mother    Diabetes Father    Hypertension Father     Stroke Father    Kidney disease Father    Diabetes Sister    Drug abuse Son        DIED FROM OD   Early death Daughter    Prostate cancer Maternal Uncle    Colon cancer Neg Hx    Esophageal cancer Neg Hx    Pancreatic cancer Neg Hx    Rectal cancer Neg Hx    Stomach cancer Neg Hx     Social History   Socioeconomic History   Marital status: Single    Spouse name: Not on file   Number of children: Not on file   Years of education: Not on file   Highest education level: Not on file  Occupational History   Not on file  Tobacco Use   Smoking status: Former    Packs/day: 0.65    Years: 52.00    Total pack years: 33.80    Types: Cigarettes    Start date: 54    Quit date: 09/2021    Years since quitting: 1.1   Smokeless tobacco: Never  Vaping Use   Vaping Use: Never used  Substance and Sexual Activity   Alcohol use: No    Alcohol/week: 0.0 standard drinks of alcohol   Drug  use: No   Sexual activity: Yes    Birth control/protection: None  Other Topics Concern   Not on file  Social History Narrative   Not on file   Social Determinants of Health   Financial Resource Strain: Not on file  Food Insecurity: Not on file  Transportation Needs: Not on file  Physical Activity: Not on file  Stress: Not on file  Social Connections: Not on file  Intimate Partner Violence: Not on file    Past Surgical History:  Procedure Laterality Date   HERNIA REPAIR Right    INGUINAL      Current Outpatient Medications:    aspirin EC 81 MG tablet, Take 162 mg by mouth daily., Disp: , Rfl:    atenolol (TENORMIN) 25 MG tablet, Take 1 tablet by mouth twice daily for blood pressure., Disp: 90 tablet, Rfl: 3   atorvastatin (LIPITOR) 40 MG tablet, TAKE 1 TABLET BY MOUTH 4  TIMES A WEEK ON MONDAY ,  WEDNESDAY , FRIDAY AND  SATURDAY FOR CHOLESTEROL, Disp: 90 tablet, Rfl: 3   Cyanocobalamin (VITAMIN B 12) 500 MCG TABS, Take 1 tablet by mouth daily., Disp: , Rfl:    furosemide (LASIX) 40 MG  tablet, Take 1 tablet by mouth 1 to 2 times per day for blood pressure and fluid retention/ankle swelling, Disp: 180 tablet, Rfl: 3   meloxicam (MOBIC) 15 MG tablet, Take  1/2 to 1 tablet  Daily  with Food  for Pain & Inflammation                                       /                             TAKE                           BY                       MOUTH, Disp: 90 tablet, Rfl: 3   pantoprazole (PROTONIX) 40 MG tablet, TAKE 1 TABLET BY MOUTH  DAILY TO PREVENT HEARTBURN  AND INDIGESTION, Disp: 90 tablet, Rfl: 3   potassium chloride (KLOR-CON M) 10 MEQ tablet, Take 1 tablet (10 mEq total) by mouth daily., Disp: 90 tablet, Rfl: 3   tadalafil (CIALIS) 20 MG tablet, Take 1/2 to 1 tablet every 2 to 3 days as needed for XXXX, Disp: 30 tablet, Rfl: 12   tamsulosin (FLOMAX) 0.4 MG CAPS capsule, Take 1 capsule by mouth daily for prostate., Disp: 90 capsule, Rfl: 3   VITAMIN D PO, Take 10,000 Units by mouth 4 (four) times a week., Disp: , Rfl:     Physical Exam: Blood pressure 132/78, pulse 67, height '5\' 9"'$  (1.753 m), weight 218 lb (98.9 kg), SpO2 97 %.    Affect appropriate Healthy:  appears stated age 24: normal Neck supple with no adenopathy JVP normal no bruits no thyromegaly Lungs clear with no wheezing and good diaphragmatic motion Heart:  S1/S2 no murmur, no rub, gallop or click PMI normal Abdomen: benighn, BS positve, no tenderness, no AAA no bruit.  No HSM or HJR Distal pulses intact with no bruits Trace LE edema Neuro non-focal Skin warm and dry No muscular weakness   Labs:   Lab Results  Component Value Date   WBC 4.9 02/21/2022   HGB 15.2 02/21/2022   HCT 44.6 02/21/2022   MCV 95.1 02/21/2022   PLT 230 02/21/2022   No results for input(s): "NA", "K", "CL", "CO2", "BUN", "CREATININE", "CALCIUM", "PROT", "BILITOT", "ALKPHOS", "ALT", "AST", "GLUCOSE" in the last 168 hours.  Invalid input(s): "LABALBU" Lab Results  Component Value Date   CKTOTAL 119 03/13/2022    Lab  Results  Component Value Date   CHOL 131 02/21/2022   CHOL 136 11/08/2021   CHOL 149 05/23/2021   Lab Results  Component Value Date   HDL 38 (L) 02/21/2022   HDL 37 (L) 11/08/2021   HDL 31 (L) 05/23/2021   Lab Results  Component Value Date   LDLCALC 71 02/21/2022   LDLCALC 74 11/08/2021   LDLCALC 90 05/23/2021   Lab Results  Component Value Date   TRIG 136 02/21/2022   TRIG 171 (H) 11/08/2021   TRIG 186 (H) 05/23/2021   Lab Results  Component Value Date   CHOLHDL 3.4 02/21/2022   CHOLHDL 3.7 11/08/2021   CHOLHDL 4.8 05/23/2021   No results found for: "LDLDIRECT"    Radiology: No results found.  EKG: SR rate 55 normal 11/14/21 10/28/2022 SR rate 63 normal    ASSESSMENT AND PLAN:   CAD:  sub clinical seen on lung cancer CT ECG normal Dense LM/LAD and 3 vessel calcium.   Normal PET/CT 07/30/22 Continue ASA/statin and beta blocker  HTN: with evidence of aortic aneurysm on beta blocker in good range  Aortic Aneurysm:  4.4 cm trileaflet AV mild AR  repeat CTA in a year  Venous Insufficiency :  continue lasix Discussed low sodium diet   Gated chest CTA for aneurysm in a year   F/U in a year  May 2024   Signed: Jenkins Rouge 10/28/2022, 2:28 PM

## 2022-10-24 ENCOUNTER — Other Ambulatory Visit: Payer: Self-pay

## 2022-10-24 ENCOUNTER — Telehealth: Payer: Self-pay | Admitting: Family Medicine

## 2022-10-24 DIAGNOSIS — M159 Polyosteoarthritis, unspecified: Secondary | ICD-10-CM

## 2022-10-24 DIAGNOSIS — N320 Bladder-neck obstruction: Secondary | ICD-10-CM

## 2022-10-24 DIAGNOSIS — I872 Venous insufficiency (chronic) (peripheral): Secondary | ICD-10-CM

## 2022-10-24 DIAGNOSIS — K219 Gastro-esophageal reflux disease without esophagitis: Secondary | ICD-10-CM

## 2022-10-24 DIAGNOSIS — I10 Essential (primary) hypertension: Secondary | ICD-10-CM

## 2022-10-24 DIAGNOSIS — E782 Mixed hyperlipidemia: Secondary | ICD-10-CM

## 2022-10-24 MED ORDER — MELOXICAM 15 MG PO TABS: ORAL_TABLET | ORAL | 3 refills | Status: DC

## 2022-10-24 MED ORDER — FUROSEMIDE 40 MG PO TABS: ORAL_TABLET | ORAL | 3 refills | Status: DC

## 2022-10-24 MED ORDER — TAMSULOSIN HCL 0.4 MG PO CAPS: ORAL_CAPSULE | ORAL | 3 refills | Status: DC

## 2022-10-24 MED ORDER — ATENOLOL 25 MG PO TABS: ORAL_TABLET | ORAL | 3 refills | Status: DC

## 2022-10-24 MED ORDER — ATORVASTATIN CALCIUM 40 MG PO TABS: ORAL_TABLET | ORAL | 3 refills | Status: DC

## 2022-10-24 MED ORDER — TADALAFIL 20 MG PO TABS: ORAL_TABLET | ORAL | 12 refills | Status: AC

## 2022-10-24 MED ORDER — POTASSIUM CHLORIDE CRYS ER 10 MEQ PO TBCR: 10.0000 meq | EXTENDED_RELEASE_TABLET | Freq: Every day | ORAL | 3 refills | Status: DC

## 2022-10-24 MED ORDER — PANTOPRAZOLE SODIUM 40 MG PO TBEC: DELAYED_RELEASE_TABLET | ORAL | 3 refills | Status: DC

## 2022-10-24 NOTE — Telephone Encounter (Signed)
Spoke with patient to schedule AWVS.  He stated that he will need all his medicine refilled under Dr Samella Parr name and sent to his pharmacy his insurance requires him to use.

## 2022-10-28 ENCOUNTER — Encounter: Payer: Self-pay | Admitting: Cardiovascular Disease

## 2022-10-28 ENCOUNTER — Ambulatory Visit: Payer: Medicare Other | Attending: Cardiovascular Disease | Admitting: Cardiovascular Disease

## 2022-10-28 VITALS — BP 132/78 | HR 67 | Ht 69.0 in | Wt 218.0 lb

## 2022-10-28 DIAGNOSIS — R600 Localized edema: Secondary | ICD-10-CM

## 2022-10-28 DIAGNOSIS — I712 Thoracic aortic aneurysm, without rupture, unspecified: Secondary | ICD-10-CM

## 2022-10-28 DIAGNOSIS — I2510 Atherosclerotic heart disease of native coronary artery without angina pectoris: Secondary | ICD-10-CM

## 2022-10-28 DIAGNOSIS — R609 Edema, unspecified: Secondary | ICD-10-CM | POA: Diagnosis not present

## 2022-10-28 DIAGNOSIS — E782 Mixed hyperlipidemia: Secondary | ICD-10-CM | POA: Diagnosis not present

## 2022-10-28 NOTE — Patient Instructions (Addendum)
Medication Instructions:  Your physician recommends that you continue on your current medications as directed. Please refer to the Current Medication list given to you today.  *If you need a refill on your cardiac medications before your next appointment, please call your pharmacy*  Lab Work: If you have labs (blood work) drawn today and your tests are completely normal, you will receive your results only by: [place] (if you have MyChart) OR A paper copy in the mail If you have any lab test that is abnormal or we need to change your treatment, we will call you to review the results.  Testing/Procedures: Gated Chest CT scanning, (CAT scanning) in May, is a noninvasive, special x-ray that produces cross-sectional images of the body using x-rays and a computer. CT scans help physicians diagnose and treat medical conditions. For some CT exams, a contrast material is used to enhance visibility in the area of the body being studied. CT scans provide greater clarity and reveal more details than regular x-ray exams.  Follow-Up: At [facility], you and your health needs are our priority.  As part of our continuing mission to provide you with exceptional heart care, we have created designated Provider Care Teams.  These Care Teams include your primary Cardiologist (physician) and Advanced Practice Providers (APPs -  Physician Assistants and Nurse Practitioners) who all work together to provide you with the care you need, when you need it.  We recommend signing up for the patient portal called "MyChart".  Sign up information is provided on this After Visit Summary.  MyChart is used to connect with patients for Virtual Visits (Telemedicine).  Patients are able to view lab/test results, encounter notes, upcoming appointments, etc.  Non-urgent messages can be sent to your provider as well.   To learn more about what you can do with MyChart, go to [email or web address].    Your next  appointment:   1 year(s)  The format for your next appointment:   In Person  Provider:   Jenkins Rouge, MD      Important Information About Sugar

## 2022-11-19 ENCOUNTER — Encounter: Payer: Medicare Other | Admitting: Internal Medicine

## 2022-12-02 NOTE — Patient Instructions (Signed)
[name] , Thank you for taking time to come for your Medicare Wellness Visit. I appreciate your ongoing commitment to your health goals. Please review the following plan we discussed and let me know if I can assist you in the future.   These are the goals we discussed:  Goals      Exercise 150 min/wk Moderate Activity     LDL CALC < 70     Quit Smoking        This is a list of the screening recommended for you and due dates:  Health Maintenance  Topic Date Due   Medicare Annual Wellness Visit  Never done   COVID-19 Vaccine (1) Never done   Zoster (Shingles) Vaccine (1 of 2) Never done   Flu Shot  Never done   Screening for Lung Cancer  03/20/2023   DTaP/Tdap/Td vaccine (3 - Td or Tdap) 12/14/2024   Colon Cancer Screening  01/01/2026   Pneumonia Vaccine  Completed   Hepatitis C Screening: USPSTF Recommendation to screen - Ages 35-79 yo.  Completed   HPV Vaccine  Aged Out   Cologuard (Stool DNA test)  Discontinued    Advanced directives: Advance directive discussed with you today. I have provided a copy for you to complete at home and have notarized. Once this is complete please bring a copy in to our office so we can scan it into your chart.   Conditions/risks identified: Aim for 30 minutes of exercise or brisk walking, 6-8 glasses of water, and 5 servings of fruits and vegetables each day.   Next appointment: Follow up in one year for your annual wellness visit.   Preventive Care 70 Years and Older, Male  Preventive care refers to lifestyle choices and visits with your health care provider that can promote health and wellness. What does preventive care include? A yearly physical exam. This is also called an annual well check. Dental exams once or twice a year. Routine eye exams. Ask your health care provider how often you should have your eyes checked. Personal lifestyle choices, including: Daily care of your teeth and gums. Regular physical activity. Eating a healthy  diet. Avoiding tobacco and drug use. Limiting alcohol use. Practicing safe sex. Taking low doses of aspirin every day. Taking vitamin and mineral supplements as recommended by your health care provider. What happens during an annual well check? The services and screenings done by your health care provider during your annual well check will depend on your age, overall health, lifestyle risk factors, and family history of disease. Counseling  Your health care provider may ask you questions about your: Alcohol use. Tobacco use. Drug use. Emotional well-being. Home and relationship well-being. Sexual activity. Eating habits. History of falls. Memory and ability to understand (cognition). Work and work Statistician. Screening  You may have the following tests or measurements: Height, weight, and BMI. Blood pressure. Lipid and cholesterol levels. These may be checked every 5 years, or more frequently if you are over 23 years old. Skin check. Lung cancer screening. You may have this screening every year starting at age 90 if you have a 30-pack-year history of smoking and currently smoke or have quit within the past 15 years. Fecal occult blood test (FOBT) of the stool. You may have this test every year starting at age 70. Flexible sigmoidoscopy or colonoscopy. You may have a sigmoidoscopy every 5 years or a colonoscopy every 10 years starting at age 37. Prostate cancer screening. Recommendations will vary depending on  your family history and other risks. Hepatitis C blood test. Hepatitis B blood test. Sexually transmitted disease (STD) testing. Diabetes screening. This is done by checking your blood sugar (glucose) after you have not eaten for a while (fasting). You may have this done every 1-3 years. Abdominal aortic aneurysm (AAA) screening. You may need this if you are a current or former smoker. Osteoporosis. You may be screened starting at age 29 if you are at high risk. Talk with  your health care provider about your test results, treatment options, and if necessary, the need for more tests. Vaccines  Your health care provider may recommend certain vaccines, such as: Influenza vaccine. This is recommended every year. Tetanus, diphtheria, and acellular pertussis (Tdap, Td) vaccine. You may need a Td booster every 10 years. Zoster vaccine. You may need this after age 98. Pneumococcal 13-valent conjugate (PCV13) vaccine. One dose is recommended after age 16. Pneumococcal polysaccharide (PPSV23) vaccine. One dose is recommended after age 68. Talk to your health care provider about which screenings and vaccines you need and how often you need them. This information is not intended to replace advice given to you by your health care provider. Make sure you discuss any questions you have with your health care provider. Document Released: 11/24/2015 Document Revised: 07/17/2016 Document Reviewed: 08/29/2015 Elsevier Interactive Patient Education  2017 [place] Prevention in the Home Falls can cause injuries. They can happen to people of all ages. There are many things you can do to make your home safe and to help prevent falls. What can I do on the outside of my home? Regularly fix the edges of walkways and driveways and fix any cracks. Remove anything that might make you trip as you walk through a door, such as a raised step or threshold. Trim any bushes or trees on the path to your home. Use bright outdoor lighting. Clear any walking paths of anything that might make someone trip, such as rocks or tools. Regularly check to see if handrails are loose or broken. Make sure that both sides of any steps have handrails. Any raised decks and porches should have guardrails on the edges. Have any leaves, snow, or ice cleared regularly. Use sand or salt on walking paths during winter. Clean up any spills in your garage right away. This includes oil or grease spills. What  can I do in the bathroom? Use night lights. Install grab bars by the toilet and in the tub and shower. Do not use towel bars as grab bars. Use non-skid mats or decals in the tub or shower. If you need to sit down in the shower, use a plastic, non-slip stool. Keep the floor dry. Clean up any water that spills on the floor as soon as it happens. Remove soap buildup in the tub or shower regularly. Attach bath mats securely with double-sided non-slip rug tape. Do not have throw rugs and other things on the floor that can make you trip. What can I do in the bedroom? Use night lights. Make sure that you have a light by your bed that is easy to reach. Do not use any sheets or blankets that are too big for your bed. They should not hang down onto the floor. Have a firm chair that has side arms. You can use this for support while you get dressed. Do not have throw rugs and other things on the floor that can make you trip. What can I do in the kitchen? Clean  up any spills right away. Avoid walking on wet floors. Keep items that you use a lot in easy-to-reach places. If you need to reach something above you, use a strong step stool that has a grab bar. Keep electrical cords out of the way. Do not use floor polish or wax that makes floors slippery. If you must use wax, use non-skid floor wax. Do not have throw rugs and other things on the floor that can make you trip. What can I do with my stairs? Do not leave any items on the stairs. Make sure that there are handrails on both sides of the stairs and use them. Fix handrails that are broken or loose. Make sure that handrails are as long as the stairways. Check any carpeting to make sure that it is firmly attached to the stairs. Fix any carpet that is loose or worn. Avoid having throw rugs at the top or bottom of the stairs. If you do have throw rugs, attach them to the floor with carpet tape. Make sure that you have a light switch at the top of the  stairs and the bottom of the stairs. If you do not have them, ask someone to add them for you. What else can I do to help prevent falls? Wear shoes that: Do not have high heels. Have rubber bottoms. Are comfortable and fit you well. Are closed at the toe. Do not wear sandals. If you use a stepladder: Make sure that it is fully opened. Do not climb a closed stepladder. Make sure that both sides of the stepladder are locked into place. Ask someone to hold it for you, if possible. Clearly mark and make sure that you can see: Any grab bars or handrails. First and last steps. Where the edge of each step is. Use tools that help you move around (mobility aids) if they are needed. These include: Canes. Walkers. Scooters. Crutches. Turn on the lights when you go into a dark area. Replace any light bulbs as soon as they burn out. Set up your furniture so you have a clear path. Avoid moving your furniture around. If any of your floors are uneven, fix them. If there are any pets around you, be aware of where they are. Review your medicines with your doctor. Some medicines can make you feel dizzy. This can increase your chance of falling. Ask your doctor what other things that you can do to help prevent falls. This information is not intended to replace advice given to you by your health care provider. Make sure you discuss any questions you have with your health care provider. Document Released: 08/24/2009 Document Revised: 04/04/2016 Document Reviewed: 12/02/2014 Elsevier Interactive Patient Education  2017 [employer].

## 2022-12-03 ENCOUNTER — Ambulatory Visit (INDEPENDENT_AMBULATORY_CARE_PROVIDER_SITE_OTHER): Payer: Medicare Other

## 2022-12-03 ENCOUNTER — Encounter: Payer: Medicare Other | Admitting: Internal Medicine

## 2022-12-03 VITALS — Ht 69.0 in | Wt 218.0 lb

## 2022-12-03 DIAGNOSIS — Z0000 Encounter for general adult medical examination without abnormal findings: Secondary | ICD-10-CM | POA: Diagnosis not present

## 2022-12-03 NOTE — Progress Notes (Signed)
Subjective:   Steven Bray. is a 72 y.o. male who presents for Medicare Annual/Subsequent preventive examination.  I connected with  Nelly Laurence. on 12/03/22 by a audio enabled telemedicine application and verified that I am speaking with the correct person using two identifiers.  Patient Location: Home  Provider Location: Office/Clinic  I discussed the limitations of evaluation and management by telemedicine. The patient expressed understanding and agreed to proceed.  Review of Systems     Cardiac Risk Factors include: advanced age (>80mn, >>17women);hypertension;male gender;dyslipidemia;smoking/ tobacco exposure     Objective:    Today's Vitals   12/03/22 0824  Weight: 218 lb (98.9 kg)  Height: '5\' 9"'$  (1.753 m)   Body mass index is 32.19 kg/m.     12/03/2022    8:28 AM 05/23/2021    3:19 PM 01/04/2020   11:28 AM 01/18/2019    8:53 AM  Advanced Directives  Does Patient Have a Medical Advance Directive? No No No No  Would patient like information on creating a medical advance directive? Yes (MAU/Ambulatory/Procedural Areas - Information given) Yes (MAU/Ambulatory/Procedural Areas - Information given) Yes (MAU/Ambulatory/Procedural Areas - Information given) Yes (MAU/Ambulatory/Procedural Areas - Information given)    Current Medications (verified) Outpatient Encounter Medications as of 12/03/2022  Medication Sig   aspirin EC 81 MG tablet Take 162 mg by mouth daily.   atenolol (TENORMIN) 25 MG tablet Take 1 tablet by mouth twice daily for blood pressure.   atorvastatin (LIPITOR) 40 MG tablet TAKE 1 TABLET BY MOUTH 4  TIMES A WEEK ON MONDAY ,  WEDNESDAY , FRIDAY AND  SATURDAY FOR CHOLESTEROL   Cyanocobalamin (VITAMIN B 12) 500 MCG TABS Take 1 tablet by mouth daily.   furosemide (LASIX) 40 MG tablet Take 1 tablet by mouth 1 to 2 times per day for blood pressure and fluid retention/ankle swelling   meloxicam (MOBIC) 15 MG tablet Take  1/2 to 1 tablet  Daily  with  Food  for Pain & Inflammation                                       /                             TAKE                           BY                       MOUTH   pantoprazole (PROTONIX) 40 MG tablet TAKE 1 TABLET BY MOUTH  DAILY TO PREVENT HEARTBURN  AND INDIGESTION   potassium chloride (KLOR-CON M) 10 MEQ tablet Take 1 tablet (10 mEq total) by mouth daily.   tadalafil (CIALIS) 20 MG tablet Take 1/2 to 1 tablet every 2 to 3 days as needed for XXXX   tamsulosin (FLOMAX) 0.4 MG CAPS capsule Take 1 capsule by mouth daily for prostate.   VITAMIN D PO Take 10,000 Units by mouth 4 (four) times a week.   No facility-administered encounter medications on file as of 12/03/2022.    Allergies (verified) Sulfa antibiotics   History: Past Medical History:  Diagnosis Date   Arthritis    ED (erectile dysfunction)    GERD (gastroesophageal reflux disease)  Hyperlipidemia    Hypertension    Pre-diabetes    Stroke ([place])    TIA - around age 57   TIA (transient ischemic attack)    Vitamin D deficiency    Past Surgical History:  Procedure Laterality Date   HERNIA REPAIR Right    INGUINAL   Family History  Problem Relation Age of Onset   Cancer Mother        LYMPHOMA   Hyperlipidemia Mother    Hypertension Mother    Diabetes Father    Hypertension Father    Stroke Father    Kidney disease Father    Diabetes Sister    Drug abuse Son        DIED FROM OD   Early death Daughter    Prostate cancer Maternal Uncle    Colon cancer Neg Hx    Esophageal cancer Neg Hx    Pancreatic cancer Neg Hx    Rectal cancer Neg Hx    Stomach cancer Neg Hx    Social History   Socioeconomic History   Marital status: Single    Spouse name: Not on file   Number of children: Not on file   Years of education: Not on file   Highest education level: Not on file  Occupational History   Not on file  Tobacco Use   Smoking status: Former    Packs/day: 0.65    Years: 52.00    Total pack years: 33.80     Types: Cigarettes    Start date: 67    Quit date: 09/2021    Years since quitting: 1.2   Smokeless tobacco: Never  Vaping Use   Vaping Use: Never used  Substance and Sexual Activity   Alcohol use: No    Alcohol/week: 0.0 standard drinks of alcohol   Drug use: No   Sexual activity: Yes    Birth control/protection: None  Other Topics Concern   Not on file  Social History Narrative   Not on file   Social Determinants of Health   Financial Resource Strain: Low Risk  (12/03/2022)   Overall Financial Resource Strain (CARDIA)    Difficulty of Paying Living Expenses: Not hard at all  Food Insecurity: No Food Insecurity (12/03/2022)   Hunger Vital Sign    Worried About Running Out of Food in the Last Year: Never true    Ran Out of Food in the Last Year: Never true  Transportation Needs: No Transportation Needs (12/03/2022)   PRAPARE - Hydrologist (Medical): No    Lack of Transportation (Non-Medical): No  Physical Activity: Sufficiently Active (12/03/2022)   Exercise Vital Sign    Days of Exercise per Week: 5 days    Minutes of Exercise per Session: 30 min  Stress: No Stress Concern Present (12/03/2022)   [place]    Feeling of Stress : Not at all  Social Connections: Moderately Integrated (12/03/2022)   Social Connection and Isolation Panel [NHANES]    Frequency of Communication with Friends and Family: More than three times a week    Frequency of Social Gatherings with Friends and Family: Three times a week    Attends Religious Services: More than 4 times per year    Active Member of Clubs or Organizations: Yes    Attends Archivist Meetings: More than 4 times per year    Marital Status: Never married    Tobacco Counseling Counseling given:  Not Answered   Clinical Intake:  Pre-visit preparation completed: Yes  Pain : No/denies pain  Diabetes: No  How often do you  need to have someone help you when you read instructions, pamphlets, or other written materials from your doctor or pharmacy?: 1 - Never  Diabetic?No   Interpreter Needed?: No  Information entered by :: Denman George LPN   Activities of Daily Living    12/03/2022    8:27 AM  In your present state of health, do you have any difficulty performing the following activities:  Hearing? 0  Vision? 0  Difficulty concentrating or making decisions? 0  Walking or climbing stairs? 0  Dressing or bathing? 0  Doing errands, shopping? 0  Preparing Food and eating ? N  Using the Toilet? N  In the past six months, have you accidently leaked urine? N  Do you have problems with loss of bowel control? N  Managing your Medications? N  Managing your Finances? N  Housekeeping or managing your Housekeeping? N    Patient Care Team: Susy Frizzle, MD as PCP - General (Family Medicine) Josue Hector, MD as PCP - Cardiology (Cardiology) [name] Kirke Corin, MD as Consulting Physician (Gastroenterology) Lajuana Ripple, MD as Referring Physician (Dermatology)  Indicate any recent Medical Services you may have received from other than [facility] providers in the past year (date may be approximate).     Assessment:   This is a routine wellness examination for [facility].  Hearing/Vision screen Hearing Screening - Comments:: Denies hearing difficulties  Vision Screening - Comments:: Wears rx glasses - up to date with routine eye exams with    Dietary issues and exercise activities discussed: Current Exercise Habits: Home exercise routine, Type of exercise: walking, Time (Minutes): 30, Intensity: Mild   Goals Addressed   None    Depression Screen    12/03/2022    8:25 AM 05/23/2021    3:32 PM 10/26/2020   12:34 AM 04/10/2020    7:14 PM 01/04/2020   11:41 AM 09/27/2019   11:54 PM 05/10/2019   10:42 PM  PHQ 2/9 Scores  PHQ - 2 Score 0 0 0 0 0 0 0    Fall Risk    12/03/2022    8:24 AM  05/09/2022   10:30 AM 05/23/2021    3:32 PM 10/26/2020   12:34 AM 04/10/2020    7:14 PM  Fall Risk   Falls in the past year? 0 0 0 0 0  Number falls in past yr: 0 0 0    Injury with Fall? 0 0 0    Risk for fall due to :   No Fall Risks No Fall Risks No Fall Risks  Follow up Falls prevention discussed;Education provided;Falls evaluation completed  Falls prevention discussed;Falls evaluation completed Falls evaluation completed;Education provided;Falls prevention discussed Falls prevention discussed;Education provided;Falls evaluation completed    FALL RISK PREVENTION PERTAINING TO THE HOME:  Any stairs in or around the home? Yes  If so, are there any without handrails? No  Home free of loose throw rugs in walkways, pet beds, electrical cords, etc? Yes  Adequate lighting in your home to reduce risk of falls? Yes   ASSISTIVE DEVICES UTILIZED TO PREVENT FALLS:  Life alert? No  Use of a cane, walker or w/c? No  Grab bars in the bathroom? Yes  Shower chair or bench in shower? No  Elevated toilet seat or a handicapped toilet? Yes   TIMED UP AND GO:  Was  the test performed? No . Telephonic visit   Cognitive Function:        12/03/2022    8:28 AM  6CIT Screen  What Year? 0 points  What month? 0 points  What time? 0 points  Count back from 20 0 points  Months in reverse 0 points  Repeat phrase 0 points  Total Score 0 points    Immunizations Immunization History  Administered Date(s) Administered   PPD Test 12/13/2013, 12/14/2014, 01/11/2016, 02/28/2017   Pneumococcal Conjugate-13 08/17/2018   Pneumococcal Polysaccharide-23 09/28/2019   Pneumococcal-Unspecified 11/24/2008   Td 11/11/2004   Tdap 12/14/2014    TDAP status: Up to date  Flu Vaccine status: Declined, Education has been provided regarding the importance of this vaccine but patient still declined. Advised may receive this vaccine at local pharmacy or Health Dept. Aware to provide a copy of the vaccination  record if obtained from local pharmacy or Health Dept. Verbalized acceptance and understanding.  Pneumococcal vaccine status: Up to date  Covid-19 vaccine status: Information provided on how to obtain vaccines.   Qualifies for Shingles Vaccine? Yes   Zostavax completed No   Shingrix Completed?: No.    Education has been provided regarding the importance of this vaccine. Patient has been advised to call insurance company to determine out of pocket expense if they have not yet received this vaccine. Advised may also receive vaccine at local pharmacy or Health Dept. Verbalized acceptance and understanding.  Screening Tests Health Maintenance  Topic Date Due   COVID-19 Vaccine (1) Never done   Zoster Vaccines- Shingrix (1 of 2) Never done   INFLUENZA VACCINE  Never done   Lung Cancer Screening  03/20/2023   Medicare Annual Wellness (AWV)  12/04/2023   DTaP/Tdap/Td (3 - Td or Tdap) 12/14/2024   COLONOSCOPY (Pts 45-32yr Insurance coverage will need to be confirmed)  01/01/2026   Pneumonia Vaccine 70 Years old  Completed   Hepatitis C Screening  Completed   HPV VACCINES  Aged Out   Fecal DNA (Cologuard)  Discontinued    Health Maintenance  Health Maintenance Due  Topic Date Due   COVID-19 Vaccine (1) Never done   Zoster Vaccines- Shingrix (1 of 2) Never done   INFLUENZA VACCINE  Never done    Colorectal cancer screening: Type of screening: Colonoscopy. Completed 01/01/21. Repeat every 5 years  Lung Cancer Screening: (Low Dose CT Chest recommended if Age 70-80years, 30 pack-year currently smoking OR have quit w/in 15years.) does qualify.   Lung Cancer Screening Referral: ordered 10/28/22  Additional Screening:  Hepatitis C Screening: does qualify; Completed 12/13/13  Vision Screening: Recommended annual ophthalmology exams for early detection of glaucoma and other disorders of the eye. Is the patient up to date with their annual eye exam?  Yes  Who is the provider or what is  the name of the office in which the patient attends annual [facility] exams[facility]  If pt is not established with a provider, would they like to be referred to a provider to establish care? No .   Dental Screening: Recommended annual dental exams for proper oral hygiene  Community Resource Referral / Chronic Care Management: CRR required this visit?  No   CCM required this visit?  No      Plan:     I have personally reviewed and noted the following in the patient's chart:   Medical and social history Use of alcohol, tobacco or illicit drugs  Current medications and supplements  including opioid prescriptions. Patient is not currently taking opioid prescriptions. Functional ability and status Nutritional status Physical activity Advanced directives List of other physicians Hospitalizations, surgeries, and ER visits in previous 12 months Vitals Screenings to include cognitive, depression, and falls Referrals and appointments  In addition, I have reviewed and discussed with patient certain preventive protocols, quality metrics, and best practice recommendations. A written personalized care plan for preventive services as well as general preventive health recommendations were provided to patient.     Vanetta Mulders, [place]   8/75/7972   Due to this being a virtual visit, the after visit summary with patients personalized plan was offered to patient via mail or my-chart.  per request, patient was mailed a copy of AVS  Nurse Notes: Patient has concern of ankle edema in both legs that is present when he wakes up in the morning. Along with concerns with arthritis pain.

## 2022-12-10 ENCOUNTER — Encounter: Payer: Self-pay | Admitting: Family Medicine

## 2022-12-10 ENCOUNTER — Ambulatory Visit (INDEPENDENT_AMBULATORY_CARE_PROVIDER_SITE_OTHER): Payer: Medicare Other | Admitting: Family Medicine

## 2022-12-10 VITALS — BP 138/72 | HR 55 | Temp 98.2°F | Ht 69.0 in | Wt 221.0 lb

## 2022-12-10 DIAGNOSIS — I5030 Unspecified diastolic (congestive) heart failure: Secondary | ICD-10-CM

## 2022-12-10 DIAGNOSIS — Z125 Encounter for screening for malignant neoplasm of prostate: Secondary | ICD-10-CM

## 2022-12-10 DIAGNOSIS — I10 Essential (primary) hypertension: Secondary | ICD-10-CM

## 2022-12-10 DIAGNOSIS — Z0000 Encounter for general adult medical examination without abnormal findings: Secondary | ICD-10-CM

## 2022-12-10 DIAGNOSIS — Z0001 Encounter for general adult medical examination with abnormal findings: Secondary | ICD-10-CM | POA: Diagnosis not present

## 2022-12-10 NOTE — Progress Notes (Signed)
Subjective:    Patient ID: Steven Bray., male    DOB: February 12, 1951, 72 y.o.   MRN: [id number]  HPI Patient is a very pleasant 72 year old Caucasian gentleman here today to for CPE.  He has a history of prediabetes.  He also has a history of stroke that occurred in his 50s.  For that reason he takes an aspirin every day along with atorvastatin.  He is also on meloxicam for joint pain.  I again explained the risk of GI toxicity from meloxicam and recommended he take it less often or try voltaren gel.  At the present time he denies any abdominal pain nausea or melena.    He has a history of high blood pressure however his blood pressure today is well controlled on atenolol.  He takes furosemide for leg swelling due to chronic venous insufficiency.  He had an echocardiogram that showed an ejection fraction of 00% and mild diastolic dysfunction.  He has significant swelling in both feet and +1 edema in both legs up to his knees.  This raises concern about diastolic dysfunction causing congestive heart failure with preserved ejection fraction.  He denies any symptoms of sleep apnea such as hypersomnolence.  He denies any chest pain.  He denies any dyspnea on exertion Past Medical History:  Diagnosis Date   Arthritis    ED (erectile dysfunction)    GERD (gastroesophageal reflux disease)    Hyperlipidemia    Hypertension    Pre-diabetes    Stroke ([place])    TIA - around age 29   TIA (transient ischemic attack)    Vitamin D deficiency    Past Surgical History:  Procedure Laterality Date   HERNIA REPAIR Right    INGUINAL   Current Outpatient Medications on File Prior to Visit  Medication Sig Dispense Refill   aspirin EC 81 MG tablet Take 162 mg by mouth daily.     atenolol (TENORMIN) 25 MG tablet Take 1 tablet by mouth twice daily for blood pressure. 90 tablet 3   atorvastatin (LIPITOR) 40 MG tablet TAKE 1 TABLET BY MOUTH 4  TIMES A WEEK ON MONDAY ,  WEDNESDAY , FRIDAY AND  SATURDAY FOR  CHOLESTEROL 90 tablet 3   Cyanocobalamin (VITAMIN B 12) 500 MCG TABS Take 1 tablet by mouth daily.     furosemide (LASIX) 40 MG tablet Take 1 tablet by mouth 1 to 2 times per day for blood pressure and fluid retention/ankle swelling 180 tablet 3   meloxicam (MOBIC) 15 MG tablet Take  1/2 to 1 tablet  Daily  with Food  for Pain & Inflammation                                       /                             TAKE                           BY                       MOUTH 90 tablet 3   pantoprazole (PROTONIX) 40 MG tablet TAKE 1 TABLET BY MOUTH  DAILY TO PREVENT HEARTBURN  AND INDIGESTION 90 tablet 3   potassium chloride (KLOR-CON M)  10 MEQ tablet Take 1 tablet (10 mEq total) by mouth daily. 90 tablet 3   tadalafil (CIALIS) 20 MG tablet Take 1/2 to 1 tablet every 2 to 3 days as needed for XXXX 30 tablet 12   tamsulosin (FLOMAX) 0.4 MG CAPS capsule Take 1 capsule by mouth daily for prostate. 90 capsule 3   VITAMIN D PO Take 10,000 Units by mouth 4 (four) times a week.     No current facility-administered medications on file prior to visit.   Allergies  Allergen Reactions   Sulfa Antibiotics Nausea Only   Social History   Socioeconomic History   Marital status: Single    Spouse name: Not on file   Number of children: Not on file   Years of education: Not on file   Highest education level: Not on file  Occupational History   Not on file  Tobacco Use   Smoking status: Former    Packs/day: 0.65    Years: 52.00    Total pack years: 33.80    Types: Cigarettes    Start date: 55    Quit date: 09/2021    Years since quitting: 1.2   Smokeless tobacco: Never  Vaping Use   Vaping Use: Never used  Substance and Sexual Activity   Alcohol use: No    Alcohol/week: 0.0 standard drinks of alcohol   Drug use: No   Sexual activity: Yes    Birth control/protection: None  Other Topics Concern   Not on file  Social History Narrative   Not on file   Social Determinants of Health   Financial  Resource Strain: Low Risk  (12/03/2022)   Overall Financial Resource Strain (CARDIA)    Difficulty of Paying Living Expenses: Not hard at all  Food Insecurity: No Food Insecurity (12/03/2022)   Hunger Vital Sign    Worried About Running Out of Food in the Last Year: Never true    [place] in the Last Year: Never true  Transportation Needs: No Transportation Needs (12/03/2022)   PRAPARE - Hydrologist (Medical): No    Lack of Transportation (Non-Medical): No  Physical Activity: Sufficiently Active (12/03/2022)   Exercise Vital Sign    Days of Exercise per Week: 5 days    Minutes of Exercise per Session: 30 min  Stress: No Stress Concern Present (12/03/2022)   [place]    Feeling of Stress : Not at all  Social Connections: Moderately Integrated (12/03/2022)   Social Connection and Isolation Panel [NHANES]    Frequency of Communication with Friends and Family: More than three times a week    Frequency of Social Gatherings with Friends and Family: Three times a week    Attends Religious Services: More than 4 times per year    Active Member of Clubs or Organizations: Yes    Attends Archivist Meetings: More than 4 times per year    Marital Status: Never married  Intimate Partner Violence: Not At Risk (12/03/2022)   Humiliation, Afraid, Rape, and Kick questionnaire    Fear of Current or Ex-Partner: No    Emotionally Abused: No    Physically Abused: No    Sexually Abused: No   Family History  Problem Relation Age of Onset   Cancer Mother        LYMPHOMA   Hyperlipidemia Mother    Hypertension Mother    Diabetes Father    Hypertension  Father    Stroke Father    Kidney disease Father    Diabetes Sister    Drug abuse Son        DIED FROM OD   Early death Daughter    Prostate cancer Maternal Uncle    Colon cancer Neg Hx    Esophageal cancer Neg Hx    Pancreatic cancer Neg  Hx    Rectal cancer Neg Hx    Stomach cancer Neg Hx      Review of Systems     Objective:   Physical Exam Constitutional:      General: He is not in acute distress.    Appearance: Normal appearance. He is not ill-appearing, toxic-appearing or diaphoretic.  HENT:     Right Ear: Tympanic membrane and ear canal normal.     Left Ear: Tympanic membrane and ear canal normal.     Nose: No congestion or rhinorrhea.     Mouth/Throat:     Pharynx: Oropharynx is clear. No oropharyngeal exudate or posterior oropharyngeal erythema.  Eyes:     Extraocular Movements: Extraocular movements intact.     Conjunctiva/sclera: Conjunctivae normal.     Pupils: Pupils are equal, round, and reactive to light.  Neck:     Vascular: No carotid bruit.  Cardiovascular:     Rate and Rhythm: Normal rate and regular rhythm.     Heart sounds: Normal heart sounds. No murmur heard.    No friction rub. No gallop.  Pulmonary:     Effort: Pulmonary effort is normal. No respiratory distress.     Breath sounds: Normal breath sounds. No stridor. No wheezing, rhonchi or rales.  Abdominal:     General: Abdomen is flat. Bowel sounds are normal. There is no distension.     Palpations: Abdomen is soft.     Tenderness: There is no abdominal tenderness. There is no guarding or rebound.  Musculoskeletal:     Right lower leg: Edema present.     Left lower leg: Edema present.  Lymphadenopathy:     Cervical: No cervical adenopathy.  Skin:    Findings: No rash.  Neurological:     General: No focal deficit present.     Mental Status: He is alert and oriented to person, place, and time.     Cranial Nerves: No cranial nerve deficit.     Motor: No weakness.     Coordination: Coordination normal.     Gait: Gait normal.     Deep Tendon Reflexes: Reflexes normal.  Psychiatric:        Mood and Affect: Mood normal.        Behavior: Behavior normal.           Assessment & Plan:  Essential hypertension - Plan: CBC  with Differential/Platelet, COMPLETE METABOLIC PANEL WITH GFR, Lipid panel  Prostate cancer screening - Plan: PSA  Diastolic congestive heart failure, unspecified HF chronicity (HCC) - Plan: Brain natriuretic peptide  Encounter for Medicare annual wellness exam Based on his level of swelling, I am concerned that he may have CHF with preserved ejection fraction due to diastolic dysfunction.  I will check a BNP.  Based on his lab work, may suggest stopping potassium and adding spironolactone to try to help with the swelling as well as diastolic dysfunction/CHF.  Get CBC CMP and a lipid panel.  Goal LDL cholesterol is less than 55.  Screen for prostate cancer with a PSA.  Recommended a flu shot which he declined.  Recommended shingles  vaccine.  He has fallen once due to slipping on water but he does not report any balance issues, memory loss, or depression

## 2022-12-11 ENCOUNTER — Telehealth: Payer: Self-pay

## 2022-12-11 LAB — COMPLETE METABOLIC PANEL WITH GFR
AG Ratio: 1.4 (calc) (ref 1.0–2.5)
ALT: 11 U/L (ref 9–46)
AST: 14 U/L (ref 10–35)
Albumin: 3.9 g/dL (ref 3.6–5.1)
Alkaline phosphatase (APISO): 76 U/L (ref 35–144)
BUN: 21 mg/dL (ref 7–25)
CO2: 29 mmol/L (ref 20–32)
Calcium: 8.9 mg/dL (ref 8.6–10.3)
Chloride: 106 mmol/L (ref 98–110)
Creat: 1.03 mg/dL (ref 0.70–1.35)
Globulin: 2.7 g/dL (calc) (ref 1.9–3.7)
Glucose, Bld: 87 mg/dL (ref 65–99)
Potassium: 4 mmol/L (ref 3.5–5.3)
Sodium: 142 mmol/L (ref 135–146)
Total Bilirubin: 0.7 mg/dL (ref 0.2–1.2)
Total Protein: 6.6 g/dL (ref 6.1–8.1)
eGFR: 79 mL/min/{1.73_m2} (ref >=60)

## 2022-12-11 LAB — CBC WITH DIFFERENTIAL/PLATELET
Absolute Monocytes: 536 cells/uL (ref 200–950)
Basophils Absolute: 51 cells/uL (ref 0–200)
Basophils Relative: 1 %
Eosinophils Absolute: 184 cells/uL (ref 15–500)
Eosinophils Relative: 3.6 %
HCT: 42.3 % (ref 38.5–50.0)
Hemoglobin: 14.4 g/dL (ref 13.2–17.1)
Lymphs Abs: 1030 cells/uL (ref 850–3900)
MCH: 32.6 pg (ref 27.0–33.0)
MCHC: 34 g/dL (ref 32.0–36.0)
MCV: 95.7 fL (ref 80.0–100.0)
MPV: 10.3 fL (ref 7.5–12.5)
Monocytes Relative: 10.5 %
Neutro Abs: 3300 cells/uL (ref 1500–7800)
Neutrophils Relative %: 64.7 %
Platelets: 216 10*3/uL (ref 140–400)
RBC: 4.42 10*6/uL (ref 4.20–5.80)
RDW: 12.2 % (ref 11.0–15.0)
Total Lymphocyte: 20.2 %
WBC: 5.1 10*3/uL (ref 3.8–10.8)

## 2022-12-11 LAB — LIPID PANEL
Cholesterol: 121 mg/dL (ref <200)
HDL: 33 mg/dL — ABNORMAL LOW (ref >=40)
LDL Cholesterol (Calc): 67 mg/dL (calc)
Non-HDL Cholesterol (Calc): 88 mg/dL (calc) (ref <130)
Total CHOL/HDL Ratio: 3.7 (calc) (ref <5.0)
Triglycerides: 127 mg/dL (ref <150)

## 2022-12-11 LAB — PSA: PSA: 0.46 ng/mL (ref <=4.00)

## 2022-12-11 LAB — BRAIN NATRIURETIC PEPTIDE: Brain Natriuretic Peptide: 70 pg/mL (ref <100)

## 2022-12-11 NOTE — Telephone Encounter (Signed)
Pt called today asking for lab results and to make sure pcp send results to pt's cardiologist.  Pt state that he really wants to know what is going on w/swelling. Pls called as soon results comes back.   Told pt we'll call as soon as we get them.

## 2022-12-12 MED ORDER — SPIRONOLACTONE 25 MG PO TABS: 25.0000 mg | ORAL_TABLET | Freq: Every day | ORAL | 3 refills | Status: DC

## 2022-12-12 NOTE — Addendum Note (Signed)
Addended by: Colman Cater on: 12/12/2022 12:24 PM   Modules accepted: Orders

## 2023-01-28 ENCOUNTER — Other Ambulatory Visit: Payer: Self-pay | Admitting: Family Medicine

## 2023-01-28 DIAGNOSIS — I10 Essential (primary) hypertension: Secondary | ICD-10-CM

## 2023-02-25 DIAGNOSIS — H2513 Age-related nuclear cataract, bilateral: Secondary | ICD-10-CM | POA: Diagnosis not present

## 2023-02-25 DIAGNOSIS — H0102A Squamous blepharitis right eye, upper and lower eyelids: Secondary | ICD-10-CM | POA: Diagnosis not present

## 2023-02-25 DIAGNOSIS — H0102B Squamous blepharitis left eye, upper and lower eyelids: Secondary | ICD-10-CM | POA: Diagnosis not present

## 2023-03-13 ENCOUNTER — Ambulatory Visit (HOSPITAL_COMMUNITY)
Admission: RE | Admit: 2023-03-13 | Discharge: 2023-03-13 | Disposition: A | Discharge status: Home / Self Care | Payer: Medicare Other | Source: Ambulatory Visit | Attending: Cardiovascular Disease | Admitting: Cardiovascular Disease

## 2023-03-13 DIAGNOSIS — I2510 Atherosclerotic heart disease of native coronary artery without angina pectoris: Secondary | ICD-10-CM | POA: Insufficient documentation

## 2023-03-13 DIAGNOSIS — E782 Mixed hyperlipidemia: Secondary | ICD-10-CM

## 2023-03-13 DIAGNOSIS — I712 Thoracic aortic aneurysm, without rupture, unspecified: Secondary | ICD-10-CM | POA: Insufficient documentation

## 2023-03-13 DIAGNOSIS — I7121 Aneurysm of the ascending aorta, without rupture: Secondary | ICD-10-CM | POA: Diagnosis not present

## 2023-03-13 MED ORDER — IOHEXOL 350 MG/ML SOLN
75.0000 mL | Freq: Once | INTRAVENOUS | Status: AC | PRN
  Administered 2023-03-13: 75 mL via INTRAVENOUS

## 2023-07-04 ENCOUNTER — Other Ambulatory Visit: Payer: Self-pay | Admitting: Family Medicine

## 2023-07-04 DIAGNOSIS — K219 Gastro-esophageal reflux disease without esophagitis: Secondary | ICD-10-CM

## 2023-07-04 DIAGNOSIS — N320 Bladder-neck obstruction: Secondary | ICD-10-CM

## 2023-07-07 NOTE — Telephone Encounter (Signed)
Last OV 12/10/22 Requested Prescriptions  Pending Prescriptions Disp Refills   pantoprazole (PROTONIX) 40 MG tablet [Pharmacy Med Name: Pantoprazole Sodium 40 MG Oral Tablet Delayed Release] 100 tablet 2    Sig: TAKE 1 TABLET BY MOUTH DAILY TO  PREVENT HEARTBURN AND  INDIGESTION     Gastroenterology: Proton Pump Inhibitors Failed - 07/04/2023 10:34 PM      Failed - Valid encounter within last 12 months    Recent Outpatient Visits   None     Future Appointments             In 3 months Nishan, Noralyn Pick, MD  [facility] [address]             tamsulosin (FLOMAX) 0.4 MG CAPS capsule [Pharmacy Med Name: Tamsulosin HCl 0.4 MG Oral Capsule] 100 capsule 2    Sig: TAKE 1 CAPSULE BY MOUTH DAILY  FOR PROSTATE     Urology: Alpha-Adrenergic Blocker Failed - 07/04/2023 10:34 PM      Failed - Valid encounter within last 12 months    Recent Outpatient Visits   None     Future Appointments             In 3 months Wendall Stade, MD [facility] [address]            Passed - PSA in normal range and within 360 days    PSA  Date Value Ref Range Status  12/10/2022 0.46 < OR = 4.00 ng/mL Final    Comment:    The total PSA value from this assay system is  standardized against the WHO standard. The test  result will be approximately 20% lower when compared  to the equimolar-standardized total PSA (Beckman  Coulter). Comparison of serial PSA results should be  interpreted with this fact in mind. . This test was performed using the Siemens  chemiluminescent method. Values obtained from  different assay methods cannot be used interchangeably. PSA levels, regardless of value, should not be interpreted as absolute evidence of the presence or absence of disease.          Passed - Last BP in normal range    BP Readings from Last 1 Encounters:  12/10/22 138/72

## 2023-07-14 ENCOUNTER — Other Ambulatory Visit: Payer: Self-pay | Admitting: Family Medicine

## 2023-07-14 DIAGNOSIS — I872 Venous insufficiency (chronic) (peripheral): Secondary | ICD-10-CM

## 2023-07-16 NOTE — Telephone Encounter (Signed)
Last OV 12/10/22 Requested Prescriptions  Pending Prescriptions Disp Refills   potassium chloride (KLOR-CON M) 10 MEQ tablet [Pharmacy Med Name: Potassium Chloride Crys ER 10 MEQ Oral Tablet Extended Release] 100 tablet 2    Sig: TAKE 1 TABLET BY MOUTH DAILY     Endocrinology:  Minerals - Potassium Supplementation Failed - 07/14/2023 10:12 PM      Failed - Valid encounter within last 12 months    Recent Outpatient Visits   None     Future Appointments             In 3 months Wendall Stade, MD [facility] at [facility], [facility]            Passed - K in normal range and within 360 days    Potassium  Date Value Ref Range Status  12/10/2022 4.0 3.5 - 5.3 mmol/L Final         Passed - Cr in normal range and within 360 days    Creat  Date Value Ref Range Status  12/10/2022 1.03 0.70 - 1.35 mg/dL Final   Creatinine, Urine  Date Value Ref Range Status  11/14/2021 58 20 - 320 mg/dL Final

## 2023-07-21 IMAGING — CT CT CHEST LUNG CANCER SCREENING LOW DOSE W/O CM
1 series · 10 of 10 positions shown, 13 images · non-contrast
Comparison: None Available.

CLINICAL DATA: 70-year-old asymptomatic male former smoker with 52
pack-year smoking history, quit smoking 6 months prior.



[ct lung segmentation data · axial · 0.76mm/px · z∈[-226,-226]mm · 10 of 323 frames shown]
[frame 1/323  mediastinal]
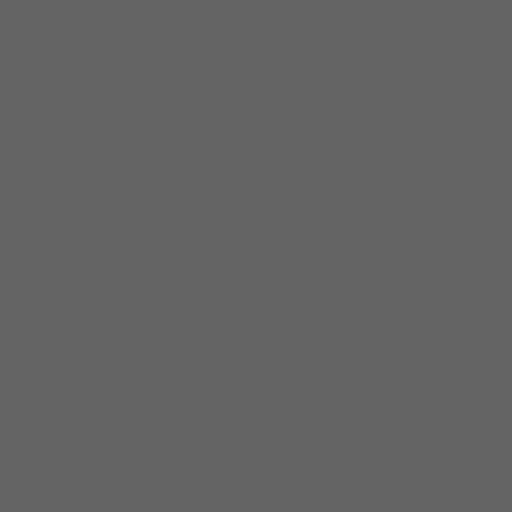
[frame 1/323  lung]
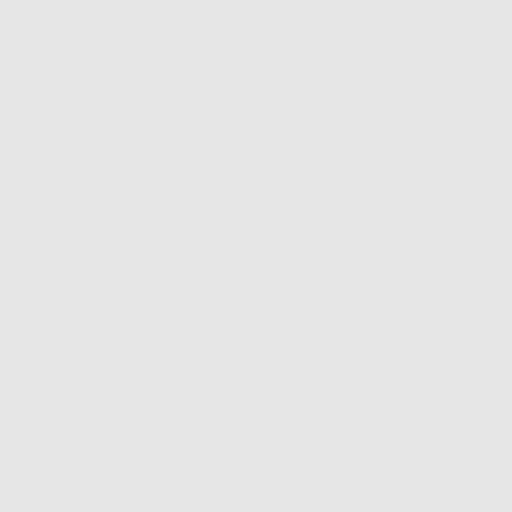
[frame 36/323  lung]
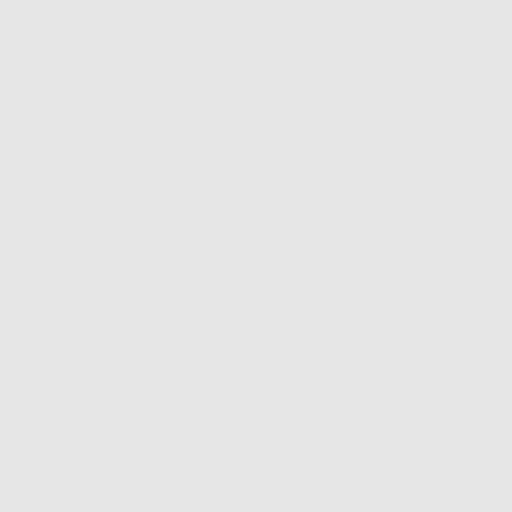
[frame 72/323  lung]
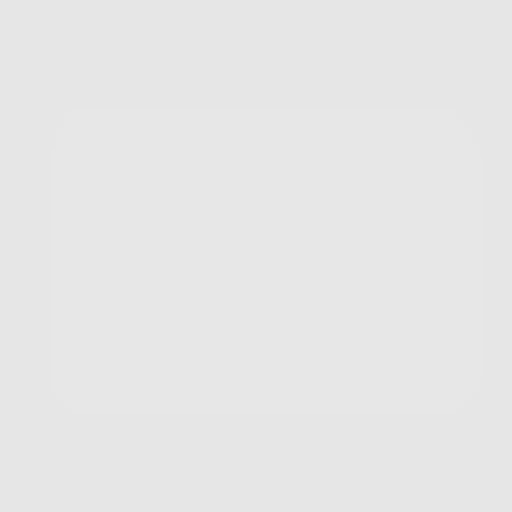
[frame 108/323  lung]
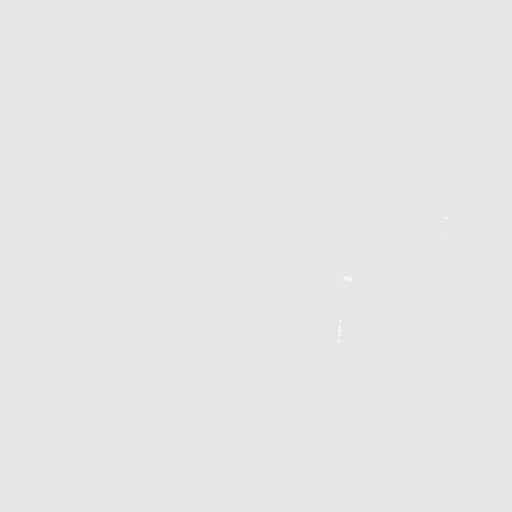
[frame 144/323  mediastinal]
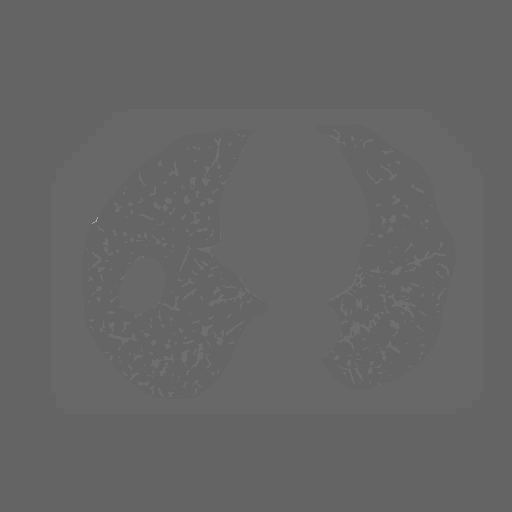
[frame 144/323  lung]
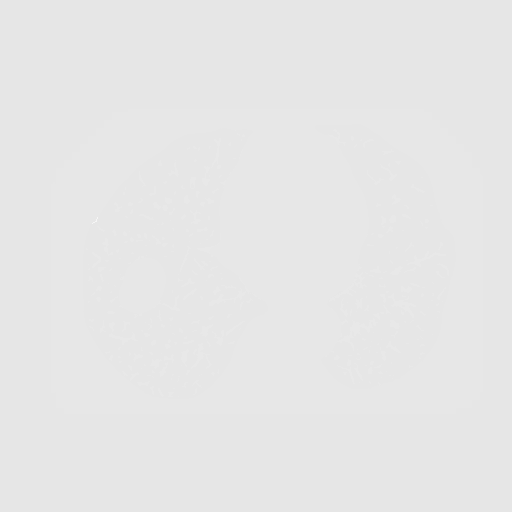
[frame 179/323  lung]
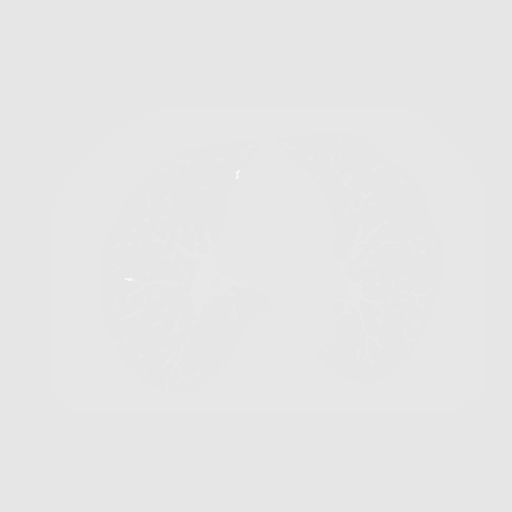
[frame 215/323  lung]
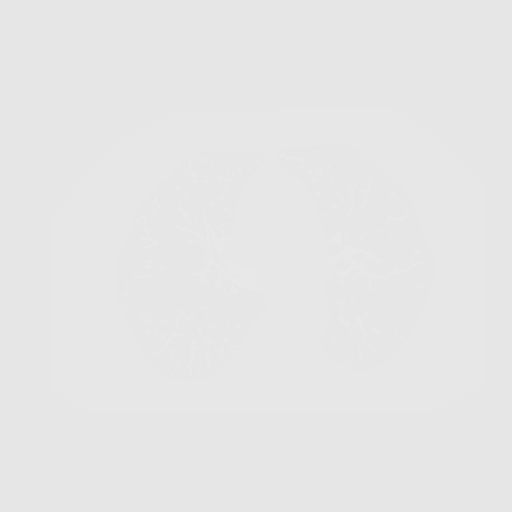
[frame 251/323  lung]
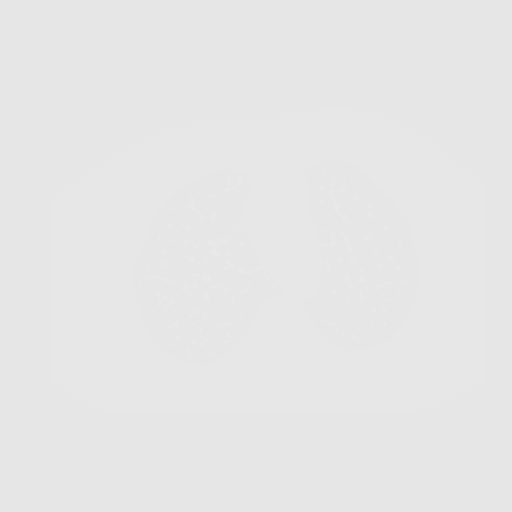
[frame 287/323  mediastinal]
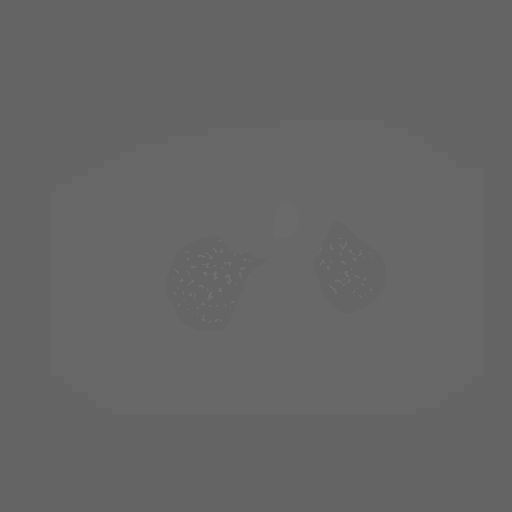
[frame 287/323  lung]
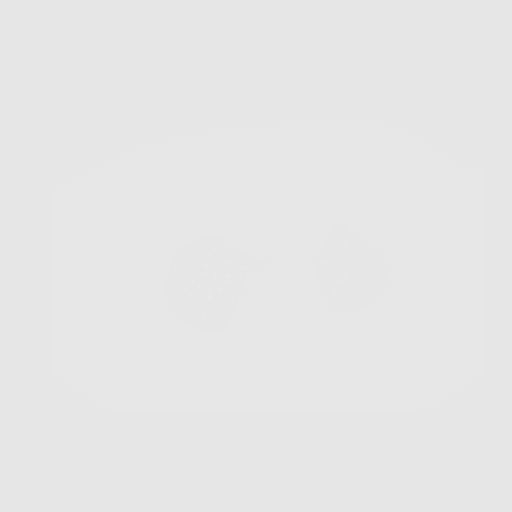
[frame 323/323  lung]
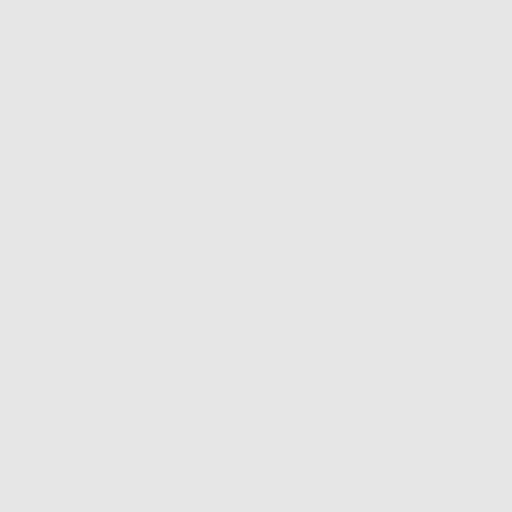

[10 of 10 positions shown; findings below may reference images not displayed]

FINDINGS: Cardiovascular: Normal heart size. No significant pericardial
effusion/thickening. Three-vessel coronary atherosclerosis.
Atherosclerotic thoracic aorta with dilated 4.4 cm ascending
thoracic aorta. Normal caliber pulmonary arteries.

Mediastinum/Nodes: No discrete thyroid nodules. Unremarkable
esophagus. No pathologically enlarged axillary, mediastinal or hilar
lymph nodes, noting limited sensitivity for the detection of hilar
adenopathy on this noncontrast study.

Lungs/Pleura: No pneumothorax. No pleural effusion. Mild
centrilobular emphysema with diffuse bronchial wall thickening. No
acute consolidative airspace disease or lung masses. A few scattered
tiny solid pulmonary nodules in both lungs, largest 2.8 mm in volume
derived mean diameter in the anterior apical right upper lobe
(series 3/image 38).

Upper abdomen: A few scattered subcentimeter hypodense liver lesions
are too small to characterize. Simple 1.3 cm anterior left liver
cyst. Exophytic 3.6 cm simple interpolar lateral right renal cyst,
for which no follow-up is recommended.

Musculoskeletal: No aggressive appearing focal osseous lesions. Mild
thoracic spondylosis.
IMPRESSION: 1. Lung-RADS 2, benign appearance or behavior. Continue annual
screening with low-dose chest CT without contrast in 12 months.
2. Dilated 4.4 cm ascending thoracic aorta, which can be reassessed
on follow-up screening chest CT in 12 months.
3. Three-vessel coronary atherosclerosis.
4. Aortic Atherosclerosis (N9YTH-BFM.M) and Emphysema (N9YTH-[id number].V).

## 2023-08-27 ENCOUNTER — Other Ambulatory Visit: Payer: Self-pay | Admitting: Family Medicine

## 2023-08-27 DIAGNOSIS — M159 Polyosteoarthritis, unspecified: Secondary | ICD-10-CM

## 2023-08-28 NOTE — Telephone Encounter (Signed)
Requested Prescriptions  Pending Prescriptions Disp Refills   meloxicam (MOBIC) 15 MG tablet [Pharmacy Med Name: Meloxicam 15 MG Oral Tablet] 100 tablet .    Sig: TAKE 1/2 TO 1 TABLET BY MOUTH  DAILY WITH FOOD FOR PAIN AND  INFLAMMATION     Analgesics:  COX2 Inhibitors Failed - 08/27/2023 10:15 PM      Failed - Manual Review: Labs are only required if the patient has taken medication for more than 8 weeks.      Failed - Valid encounter within last 12 months    Recent Outpatient Visits   None     Future Appointments             In 2 months Steven Bray, Noralyn Pick, MD [facility] at [facility], [facility]            Passed - HGB in normal range and within 360 days    Hemoglobin  Date Value Ref Range Status  12/10/2022 14.4 13.2 - 17.1 g/dL Final         Passed - Cr in normal range and within 360 days    Creat  Date Value Ref Range Status  12/10/2022 1.03 0.70 - 1.35 mg/dL Final   Creatinine, Urine  Date Value Ref Range Status  11/14/2021 58 20 - 320 mg/dL Final         Passed - HCT in normal range and within 360 days    HCT  Date Value Ref Range Status  12/10/2022 42.3 38.5 - 50.0 % Final         Passed - AST in normal range and within 360 days    AST  Date Value Ref Range Status  12/10/2022 14 10 - 35 U/L Final         Passed - ALT in normal range and within 360 days    ALT  Date Value Ref Range Status  12/10/2022 11 9 - 46 U/L Final         Passed - eGFR is 30 or above and within 360 days    GFR, Est African American  Date Value Ref Range Status  02/07/2021 88 > OR = 60 mL/min/1.74m2 Final   GFR, Est Non African American  Date Value Ref Range Status  02/07/2021 76 > OR = 60 mL/min/1.67m2 Final   eGFR  Date Value Ref Range Status  12/10/2022 79 > OR = 60 mL/min/1.50m2 Final         Passed - Patient is not pregnant

## 2023-09-10 ENCOUNTER — Other Ambulatory Visit: Payer: Self-pay

## 2023-09-10 ENCOUNTER — Telehealth: Payer: Self-pay

## 2023-09-10 DIAGNOSIS — I10 Essential (primary) hypertension: Secondary | ICD-10-CM

## 2023-09-10 DIAGNOSIS — I5030 Unspecified diastolic (congestive) heart failure: Secondary | ICD-10-CM

## 2023-09-10 MED ORDER — SPIRONOLACTONE 25 MG PO TABS: 25.0000 mg | ORAL_TABLET | Freq: Every day | ORAL | 1 refills | Status: DC

## 2023-09-10 NOTE — Telephone Encounter (Signed)
Prescription Request  09/10/2023  LOV: 12/10/22  What is the name of the medication or equipment? spironolactone (ALDACTONE) 25 MG tablet  Have you contacted your pharmacy to request a refill? Yes   Which pharmacy would you like this sent to?  [facility]) [place], [place] - [address] [facility] [address] [place] [address] [place] [address] Phone: [phone] Fax: [phone]    Patient notified that their request is being sent to the clinical staff for review and that they should receive a response within 2 business days.   Please advise at [facility] [phone]

## 2023-09-16 ENCOUNTER — Other Ambulatory Visit: Payer: Self-pay | Admitting: Family Medicine

## 2023-09-16 DIAGNOSIS — N320 Bladder-neck obstruction: Secondary | ICD-10-CM

## 2023-09-16 DIAGNOSIS — K219 Gastro-esophageal reflux disease without esophagitis: Secondary | ICD-10-CM

## 2023-09-17 NOTE — Telephone Encounter (Signed)
Requested Prescriptions  Pending Prescriptions Disp Refills   tamsulosin (FLOMAX) 0.4 MG CAPS capsule [Pharmacy Med Name: Tamsulosin HCl 0.4 MG Oral Capsule] 100 capsule 0    Sig: TAKE 1 CAPSULE BY MOUTH DAILY  FOR PROSTATE     Urology: Alpha-Adrenergic Blocker Failed - 09/16/2023  5:11 AM      Failed - Valid encounter within last 12 months    Recent Outpatient Visits   None     Future Appointments             In 1 month Wendall Stade, MD [facility] at [facility], [facility]            Passed - PSA in normal range and within 360 days    PSA  Date Value Ref Range Status  12/10/2022 0.46 < OR = 4.00 ng/mL Final    Comment:    The total PSA value from this assay system is  standardized against the WHO standard. The test  result will be approximately 20% lower when compared  to the equimolar-standardized total PSA (Beckman  Coulter). Comparison of serial PSA results should be  interpreted with this fact in mind. . This test was performed using the Siemens  chemiluminescent method. Values obtained from  different assay methods cannot be used interchangeably. PSA levels, regardless of value, should not be interpreted as absolute evidence of the presence or absence of disease.          Passed - Last BP in normal range    BP Readings from Last 1 Encounters:  12/10/22 138/72          pantoprazole (PROTONIX) 40 MG tablet [Pharmacy Med Name: Pantoprazole Sodium 40 MG Oral Tablet Delayed Release] 100 tablet 0    Sig: TAKE 1 TABLET BY MOUTH DAILY TO  PREVENT HEARTBURN AND  INDIGESTION     Gastroenterology: Proton Pump Inhibitors Failed - 09/16/2023  5:11 AM      Failed - Valid encounter within last 12 months    Recent Outpatient Visits   None     Future Appointments             In 1 month Nishan, Noralyn Pick, MD [facility] at [facility], [facility]

## 2023-09-26 ENCOUNTER — Other Ambulatory Visit: Payer: Self-pay | Admitting: Family Medicine

## 2023-09-26 DIAGNOSIS — I872 Venous insufficiency (chronic) (peripheral): Secondary | ICD-10-CM

## 2023-09-26 NOTE — Telephone Encounter (Signed)
Requested Prescriptions  Pending Prescriptions Disp Refills   potassium chloride (KLOR-CON M) 10 MEQ tablet [Pharmacy Med Name: Potassium Chloride Crys ER 10 MEQ Oral Tablet Extended Release] 100 tablet 1    Sig: TAKE 1 TABLET BY MOUTH DAILY     Endocrinology:  Minerals - Potassium Supplementation Failed - 09/26/2023  5:03 AM      Failed - Valid encounter within last 12 months    Recent Outpatient Visits   None     Future Appointments             In 1 month Wendall Stade, MD [facility] at [facility], [facility]            Passed - K in normal range and within 360 days    Potassium  Date Value Ref Range Status  12/10/2022 4.0 3.5 - 5.3 mmol/L Final         Passed - Cr in normal range and within 360 days    Creat  Date Value Ref Range Status  12/10/2022 1.03 0.70 - 1.35 mg/dL Final   Creatinine, Urine  Date Value Ref Range Status  11/14/2021 58 20 - 320 mg/dL Final

## 2023-10-21 ENCOUNTER — Other Ambulatory Visit: Payer: Self-pay | Admitting: Family Medicine

## 2023-10-21 DIAGNOSIS — I5030 Unspecified diastolic (congestive) heart failure: Secondary | ICD-10-CM

## 2023-10-21 DIAGNOSIS — I10 Essential (primary) hypertension: Secondary | ICD-10-CM

## 2023-10-28 NOTE — Progress Notes (Signed)
CARDIOLOGY CONSULT NOTE       Patient ID: Steven Bray. MRN: [id number] DOB/AGE: 02-19-1951 72 y.o.  Referring Physician: Oneta Rack Primary Physician: Donita Brooks, MD Primary Cardiologist: Eden Emms Reason for Consultation: Aortic Aneurysm/CAD   HPI:  72 y.o. referred by Dr Oneta Rack for aortic aneurysm and CAD. First seen on 04/23/69 Former smoker quit 09/2021 Lung cancer CT 03/20/22 showed 4.4 cm ascending thoracic aneurysm and 3 vessel coronary calcium History of HTN, HLD, CVA Pre DM and GERD.  No clinical CAD. 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 I have seen his cousin and sister in past and he is related to [employer] a riding friend of mine   TTE 05/03/22 EF 55-60% mild LVH mild AR with tri leaflet valve Ao 4.4 cm PET / CT  07/30/22 Normal perfusion stress EF 64% ESV/EDV normal BF Reserve low normal 2.01 coronary calcium known Normal study  Gated chest CTA 03/14/23 Aorta only 4.1 cm   Doing well showed me pics of his V4 [employer] and CB 750 inline 4  ROS All other systems reviewed and negative except as noted above  Past Medical History:  Diagnosis Date   Arthritis    ED (erectile dysfunction)    GERD (gastroesophageal reflux disease)    Hyperlipidemia    Hypertension    Pre-diabetes    Stroke (HCC)    TIA - around age 72   TIA (transient ischemic attack)    Vitamin D deficiency     Family History  Problem Relation Age of Onset   Cancer Mother        LYMPHOMA   Hyperlipidemia Mother    Hypertension Mother    Diabetes Father    Hypertension Father    Stroke Father    Kidney disease Father     Diabetes Sister    Drug abuse Son        DIED FROM OD   Early death Daughter    Prostate cancer Maternal Uncle    Colon cancer Neg Hx    Esophageal cancer Neg Hx    Pancreatic cancer Neg Hx    Rectal cancer Neg Hx    Stomach cancer Neg Hx     Social History   Socioeconomic History   Marital status: Single    Spouse name: Not on file   Number of children: Not on file   Years of education: Not on file   Highest education level: Not on file  Occupational History   Not on file  Tobacco Use   Smoking status: Former    Current packs/day: 0.00    Average packs/day: 0.7 packs/day for 52.8 years (34.3 ttl pk-yrs)    Types: Cigarettes    Start date: 12    Quit date: 09/2021    Years since quitting: 2.1   Smokeless tobacco: Never  Vaping Use   Vaping status: Never Used  Substance and Sexual Activity   Alcohol use: No    Alcohol/week: 0.0 standard drinks of alcohol   Drug use: No   Sexual activity: Yes  Birth control/protection: None  Other Topics Concern   Not on file  Social History Narrative   Not on file   Social Drivers of Health   Financial Resource Strain: Low Risk  (12/03/2022)   Overall Financial Resource Strain (CARDIA)    Difficulty of Paying Living Expenses: Not hard at all  Food Insecurity: No Food Insecurity (12/03/2022)   Hunger Vital Sign    Worried About Running Out of Food in the Last Year: Never true    Ran Out of Food in the Last Year: Never true  Transportation Needs: No Transportation Needs (12/03/2022)   PRAPARE - Administrator, Civil Service (Medical): No    Lack of Transportation (Non-Medical): No  Physical Activity: Sufficiently Active (12/03/2022)   Exercise Vital Sign    Days of Exercise per Week: 5 days    Minutes of Exercise per Session: 30 min  Stress: No Stress Concern Present (12/03/2022)   [employer] of Occupational Health - Occupational Stress Questionnaire    Feeling of Stress : Not at all  Social Connections:  Moderately Integrated (12/03/2022)   Social Connection and Isolation Panel [NHANES]    Frequency of Communication with Friends and Family: More than three times a week    Frequency of Social Gatherings with Friends and Family: Three times a week    Attends Religious Services: More than 4 times per year    Active Member of Clubs or Organizations: Yes    Attends Banker Meetings: More than 4 times per year    Marital Status: Never married  Intimate Partner Violence: Not At Risk (12/03/2022)   Humiliation, Afraid, Rape, and Kick questionnaire    Fear of Current or Ex-Partner: No    Emotionally Abused: No    Physically Abused: No    Sexually Abused: No    Past Surgical History:  Procedure Laterality Date   HERNIA REPAIR Right    INGUINAL      Current Outpatient Medications:    aspirin EC 81 MG tablet, Take 162 mg by mouth daily., Disp: , Rfl:    atenolol (TENORMIN) 25 MG tablet, TAKE 1 TABLET BY MOUTH TWICE  DAILY FOR BLOOD PRESSURE, Disp: 200 tablet, Rfl: 2   atorvastatin (LIPITOR) 40 MG tablet, TAKE 1 TABLET BY MOUTH 4  TIMES A WEEK ON MONDAY ,  WEDNESDAY , FRIDAY AND  SATURDAY FOR CHOLESTEROL, Disp: 90 tablet, Rfl: 3   Cyanocobalamin (VITAMIN B 12) 500 MCG TABS, Take 1 tablet by mouth daily., Disp: , Rfl:    furosemide (LASIX) 40 MG tablet, Take 1 tablet by mouth 1 to 2 times per day for blood pressure and fluid retention/ankle swelling, Disp: 180 tablet, Rfl: 3   meloxicam (MOBIC) 15 MG tablet, TAKE 1/2 TO 1 TABLET BY MOUTH  DAILY WITH FOOD FOR PAIN AND  INFLAMMATION, Disp: 100 tablet, Rfl: .   pantoprazole (PROTONIX) 40 MG tablet, TAKE 1 TABLET BY MOUTH DAILY TO  PREVENT HEARTBURN AND  INDIGESTION, Disp: 100 tablet, Rfl: 0   spironolactone (ALDACTONE) 25 MG tablet, TAKE 1 TABLET BY MOUTH DAILY, Disp: 60 tablet, Rfl: 5   tadalafil (CIALIS) 20 MG tablet, Take 1/2 to 1 tablet every 2 to 3 days as needed for XXXX, Disp: 30 tablet, Rfl: 12   tamsulosin (FLOMAX) 0.4 MG CAPS  capsule, TAKE 1 CAPSULE BY MOUTH DAILY  FOR PROSTATE, Disp: 100 capsule, Rfl: 0   VITAMIN D PO, Take 10,000 Units by mouth 4 (four) times a week.  MON, WED, FRI & SAT, Disp: , Rfl:     Physical Exam: Blood pressure 128/74, pulse 65, resp. rate 16, height 5\' 9"  (1.753 m), weight 218 lb 3.2 oz (99 kg), SpO2 96%.    Affect appropriate Healthy:  appears stated age HEENT: normal Neck supple with no adenopathy JVP normal no bruits no thyromegaly Lungs clear with no wheezing and good diaphragmatic motion Heart:  S1/S2 no murmur, no rub, gallop or click PMI normal Abdomen: benighn, BS positve, no tenderness, no AAA no bruit.  No HSM or HJR Distal pulses intact with no bruits Trace LE edema Neuro non-focal Skin warm and dry No muscular weakness   Labs:   Lab Results  Component Value Date   WBC 5.1 12/10/2022   HGB 14.4 12/10/2022   HCT 42.3 12/10/2022   MCV 95.7 12/10/2022   PLT 216 12/10/2022   No results for input(s): "NA", "K", "CL", "CO2", "BUN", "CREATININE", "CALCIUM", "PROT", "BILITOT", "ALKPHOS", "ALT", "AST", "GLUCOSE" in the last 168 hours.  Invalid input(s): "LABALBU" Lab Results  Component Value Date   CKTOTAL 119 03/13/2022    Lab Results  Component Value Date   CHOL 121 12/10/2022   CHOL 131 02/21/2022   CHOL 136 11/08/2021   Lab Results  Component Value Date   HDL 33 (L) 12/10/2022   HDL 38 (L) 02/21/2022   HDL 37 (L) 11/08/2021   Lab Results  Component Value Date   LDLCALC 67 12/10/2022   LDLCALC 71 02/21/2022   LDLCALC 74 11/08/2021   Lab Results  Component Value Date   TRIG 127 12/10/2022   TRIG 136 02/21/2022   TRIG 171 (H) 11/08/2021   Lab Results  Component Value Date   CHOLHDL 3.7 12/10/2022   CHOLHDL 3.4 02/21/2022   CHOLHDL 3.7 11/08/2021   No results found for: "LDLDIRECT"    Radiology: No results found.  EKG: SR rate 55 normal 11/14/21 11/03/2023 SR rate 63 normal    ASSESSMENT AND PLAN:   CAD:  sub clinical seen on lung  cancer CT ECG normal Dense LM/LAD and 3 vessel calcium.   Normal PET/CT 07/30/22 Continue ASA/statin and beta blocker  HTN: with evidence of aortic aneurysm on beta blocker in good range  Aortic Aneurysm:  Only 4.1 cm on gated chest CTA 03/14/23   Venous Insufficiency :  continue lasix/aldactone check BMET today   BMET  F/U in a year  May 2024   Signed: Charlton Haws 11/03/2023, 2:15 PM

## 2023-11-03 ENCOUNTER — Ambulatory Visit: Payer: Medicare Other | Attending: Cardiovascular Disease | Admitting: Cardiovascular Disease

## 2023-11-03 ENCOUNTER — Encounter: Payer: Self-pay | Admitting: Cardiovascular Disease

## 2023-11-03 VITALS — BP 128/74 | HR 65 | Resp 16 | Ht 69.0 in | Wt 218.2 lb

## 2023-11-03 DIAGNOSIS — I2510 Atherosclerotic heart disease of native coronary artery without angina pectoris: Secondary | ICD-10-CM | POA: Diagnosis not present

## 2023-11-03 DIAGNOSIS — I10 Essential (primary) hypertension: Secondary | ICD-10-CM | POA: Diagnosis not present

## 2023-11-03 DIAGNOSIS — I712 Thoracic aortic aneurysm, without rupture, unspecified: Secondary | ICD-10-CM

## 2023-11-03 DIAGNOSIS — E782 Mixed hyperlipidemia: Secondary | ICD-10-CM

## 2023-11-03 NOTE — Patient Instructions (Signed)
Medication Instructions:  Your physician recommends that you continue on your current medications as directed. Please refer to the Current Medication list given to you today.  Labwork: NONE  Testing/Procedures: NONE  Follow-Up: Your physician wants you to follow-up in: 12 months with Dr. Nishan. You will receive a reminder letter in the mail two months in advance. If you don't receive a letter, please call our office to schedule the follow-up appointment.   If you need a refill on your cardiac medications before your next appointment, please call your pharmacy.    

## 2023-11-04 LAB — BASIC METABOLIC PANEL
BUN/Creatinine Ratio: 20 (ref 10–24)
BUN: 25 mg/dL (ref 8–27)
CO2: 26 mmol/L (ref 20–29)
Calcium: 9.5 mg/dL (ref 8.6–10.2)
Chloride: 101 mmol/L (ref 96–106)
Creatinine, Ser: 1.23 mg/dL (ref 0.76–1.27)
Glucose: 94 mg/dL (ref 70–99)
Potassium: 4.4 mmol/L (ref 3.5–5.2)
Sodium: 140 mmol/L (ref 134–144)
eGFR: 63 mL/min/{1.73_m2} (ref >59)

## 2023-11-06 ENCOUNTER — Other Ambulatory Visit: Payer: Self-pay | Admitting: Family Medicine

## 2023-11-06 DIAGNOSIS — M159 Polyosteoarthritis, unspecified: Secondary | ICD-10-CM

## 2023-11-07 ENCOUNTER — Telehealth: Payer: Self-pay | Admitting: Cardiovascular Disease

## 2023-11-07 NOTE — Telephone Encounter (Signed)
Requested medication (s) are due for refill today: yes   Requested medication (s) are on the active medication list: yes   Last refill:  08/28/23 #100   Future visit scheduled: no   Notes to clinic:  no refills . Do you want to refill Rx?     Requested Prescriptions  Pending Prescriptions Disp Refills   meloxicam (MOBIC) 15 MG tablet [Pharmacy Med Name: Meloxicam 15 MG Oral Tablet] 60 tablet 5    Sig: TAKE 1/2 TO 1 TABLET BY MOUTH  DAILY WITH FOOD FOR PAIN AND  INFLAMMATION     Analgesics:  COX2 Inhibitors Failed - 11/07/2023 11:43 AM      Failed - Manual Review: Labs are only required if the patient has taken medication for more than 8 weeks.      Failed - Valid encounter within last 12 months    Recent Outpatient Visits   None            Passed - HGB in normal range and within 360 days    Hemoglobin  Date Value Ref Range Status  12/10/2022 14.4 13.2 - 17.1 g/dL Final         Passed - Cr in normal range and within 360 days    Creat  Date Value Ref Range Status  12/10/2022 1.03 0.70 - 1.35 mg/dL Final   Creatinine, Ser  Date Value Ref Range Status  11/03/2023 1.23 0.76 - 1.27 mg/dL Final   Creatinine, Urine  Date Value Ref Range Status  11/14/2021 58 20 - 320 mg/dL Final         Passed - HCT in normal range and within 360 days    HCT  Date Value Ref Range Status  12/10/2022 42.3 38.5 - 50.0 % Final         Passed - AST in normal range and within 360 days    AST  Date Value Ref Range Status  12/10/2022 14 10 - 35 U/L Final         Passed - ALT in normal range and within 360 days    ALT  Date Value Ref Range Status  12/10/2022 11 9 - 46 U/L Final         Passed - eGFR is 30 or above and within 360 days    GFR, Est African American  Date Value Ref Range Status  02/07/2021 88 > OR = 60 mL/min/1.2m2 Final   GFR, Est Non African American  Date Value Ref Range Status  02/07/2021 76 > OR = 60 mL/min/1.47m2 Final   eGFR  Date Value Ref Range Status   11/03/2023 63 >59 mL/min/1.73 Final         Passed - Patient is not pregnant

## 2023-11-07 NOTE — Telephone Encounter (Signed)
Pt called in stating he needs a letter to be excused from jury duty. He states he has an aneurysm and "does not need the stress". Please advise.

## 2023-11-07 NOTE — Telephone Encounter (Signed)
Per Dr. Eden Emms, I cannot excuse he can mention it at court and they may excuse him Even I have to go in person. Called patient with Dr. Eden Emms message. Patient verbalized understanding and he will call his PCP.

## 2023-11-13 ENCOUNTER — Other Ambulatory Visit: Payer: Self-pay | Admitting: Family Medicine

## 2023-11-13 DIAGNOSIS — E782 Mixed hyperlipidemia: Secondary | ICD-10-CM

## 2024-01-08 ENCOUNTER — Ambulatory Visit: Payer: Medicare Other | Admitting: *Deleted

## 2024-01-08 DIAGNOSIS — Z0000 Encounter for general adult medical examination without abnormal findings: Secondary | ICD-10-CM

## 2024-01-08 NOTE — Progress Notes (Signed)
 Subjective:   Steven Bray. is a 73 y.o. male who presents for Medicare Annual/Subsequent preventive examination.  Visit Complete: Virtual I connected with  Christen Butter. on 01/08/24 by a audio enabled telemedicine application and verified that I am speaking with the correct person using two identifiers.  Patient Location: Home  Provider Location: Home Office  I discussed the limitations of evaluation and management by telemedicine. The patient expressed understanding and agreed to proceed.  Vital Signs: Because this visit was a virtual/telehealth visit, some criteria may be missing or patient reported. Any vitals not documented were not able to be obtained and vitals that have been documented are patient reported.  Cardiac Risk Factors include: advanced age (>64men, >6 women);male gender     Objective:    There were no vitals filed for this visit. There is no height or weight on file to calculate BMI.     01/08/2024    8:33 AM 12/03/2022    8:28 AM 05/23/2021    3:19 PM 01/04/2020   11:28 AM 01/18/2019    8:53 AM  Advanced Directives  Does Patient Have a Medical Advance Directive? No No No No No  Would patient like information on creating a medical advance directive? No - Patient declined Yes (MAU/Ambulatory/Procedural Areas - Information given) Yes (MAU/Ambulatory/Procedural Areas - Information given) Yes (MAU/Ambulatory/Procedural Areas - Information given) Yes (MAU/Ambulatory/Procedural Areas - Information given)    Current Medications (verified) Outpatient Encounter Medications as of 01/08/2024  Medication Sig   aspirin EC 81 MG tablet Take 162 mg by mouth daily.   atenolol (TENORMIN) 25 MG tablet TAKE 1 TABLET BY MOUTH TWICE  DAILY FOR BLOOD PRESSURE   atorvastatin (LIPITOR) 40 MG tablet TAKE 1 TABLET BY MOUTH 4 TIMES  WEEKLY ON MONDAY WEDNESDAY  FRIDAY AND SATURDAY FOR  CHOLESTEROL   Cyanocobalamin (VITAMIN B 12) 500 MCG TABS Take 1 tablet by mouth daily.    furosemide (LASIX) 40 MG tablet Take 1 tablet by mouth 1 to 2 times per day for blood pressure and fluid retention/ankle swelling   meloxicam (MOBIC) 15 MG tablet TAKE 1/2 TO 1 TABLET BY MOUTH  DAILY WITH FOOD FOR PAIN AND  INFLAMMATION   pantoprazole (PROTONIX) 40 MG tablet TAKE 1 TABLET BY MOUTH DAILY TO  PREVENT HEARTBURN AND  INDIGESTION   spironolactone (ALDACTONE) 25 MG tablet TAKE 1 TABLET BY MOUTH DAILY   tadalafil (CIALIS) 20 MG tablet Take 1/2 to 1 tablet every 2 to 3 days as needed for XXXX   tamsulosin (FLOMAX) 0.4 MG CAPS capsule TAKE 1 CAPSULE BY MOUTH DAILY  FOR PROSTATE   VITAMIN D PO Take 10,000 Units by mouth 4 (four) times a week. MON, WED, FRI & SAT   No facility-administered encounter medications on file as of 01/08/2024.    Allergies (verified) Sulfa antibiotics   History: Past Medical History:  Diagnosis Date   Arthritis    ED (erectile dysfunction)    GERD (gastroesophageal reflux disease)    Hyperlipidemia    Hypertension    Pre-diabetes    Stroke (HCC)    TIA - around age 89   TIA (transient ischemic attack)    Vitamin D deficiency    Past Surgical History:  Procedure Laterality Date   HERNIA REPAIR Right    INGUINAL   Family History  Problem Relation Age of Onset   Cancer Mother        LYMPHOMA   Hyperlipidemia Mother    Hypertension  Mother    Diabetes Father    Hypertension Father    Stroke Father    Kidney disease Father    Diabetes Sister    Drug abuse Son        DIED FROM OD   Early death Daughter    Prostate cancer Maternal Uncle    Colon cancer Neg Hx    Esophageal cancer Neg Hx    Pancreatic cancer Neg Hx    Rectal cancer Neg Hx    Stomach cancer Neg Hx    Social History   Socioeconomic History   Marital status: Single    Spouse name: Not on file   Number of children: Not on file   Years of education: Not on file   Highest education level: Not on file  Occupational History   Not on file  Tobacco Use   Smoking status:  Former    Current packs/day: 0.00    Average packs/day: 0.7 packs/day for 52.8 years (34.3 ttl pk-yrs)    Types: Cigarettes    Start date: 31    Quit date: 09/2021    Years since quitting: 2.3   Smokeless tobacco: Never  Vaping Use   Vaping status: Never Used  Substance and Sexual Activity   Alcohol use: No    Alcohol/week: 0.0 standard drinks of alcohol   Drug use: No   Sexual activity: Yes    Birth control/protection: None  Other Topics Concern   Not on file  Social History Narrative   Not on file   Social Drivers of Health   Financial Resource Strain: Low Risk  (01/08/2024)   Overall Financial Resource Strain (CARDIA)    Difficulty of Paying Living Expenses: Not hard at all  Food Insecurity: No Food Insecurity (01/08/2024)   Hunger Vital Sign    Worried About Running Out of Food in the Last Year: Never true    Ran Out of Food in the Last Year: Never true  Transportation Needs: No Transportation Needs (01/08/2024)   PRAPARE - Administrator, Civil Service (Medical): No    Lack of Transportation (Non-Medical): No  Physical Activity: Inactive (01/08/2024)   Exercise Vital Sign    Days of Exercise per Week: 0 days    Minutes of Exercise per Session: 0 min  Stress: No Stress Concern Present (01/08/2024)   [employer] of Occupational Health - Occupational Stress Questionnaire    Feeling of Stress : Not at all  Social Connections: Moderately Integrated (01/08/2024)   Social Connection and Isolation Panel [NHANES]    Frequency of Communication with Friends and Family: More than three times a week    Frequency of Social Gatherings with Friends and Family: More than three times a week    Attends Religious Services: More than 4 times per year    Active Member of [employer] or Organizations: Yes    Attends Banker Meetings: More than 4 times per year    Marital Status: Widowed    Tobacco Counseling Counseling given: Not Answered   Clinical  Intake:  Pre-visit preparation completed: Yes  Pain : No/denies pain     Diabetes: No  How often do you need to have someone help you when you read instructions, pamphlets, or other written materials from your doctor or pharmacy?: 1 - Never  Interpreter Needed?: No  Information entered by :: Remi Haggard LPN   Activities of Daily Living    01/08/2024    8:39 AM  In your  present state of health, do you have any difficulty performing the following activities:  Hearing? 0  Vision? 0  Difficulty concentrating or making decisions? 0  Walking or climbing stairs? 0  Dressing or bathing? 0  Doing errands, shopping? 0  Preparing Food and eating ? N  Using the Toilet? N  In the past six months, have you accidently leaked urine? N  Do you have problems with loss of bowel control? N  Managing your Medications? N  Managing your Finances? N  Housekeeping or managing your Housekeeping? N    Patient Care Team: Donita Brooks, MD as PCP - General (Family Medicine) Wendall Stade, MD as PCP - Cardiology (Cardiology) Myrtie Neither Andreas Blower, MD as Consulting Physician (Gastroenterology) Caro Laroche, MD as Referring Physician (Dermatology)  Indicate any recent Medical Services you may have received from other than [facility] providers in the past year (date may be approximate).     Assessment:   This is a routine wellness examination for [facility].  Hearing/Vision screen Hearing Screening - Comments:: No trouble hearing Vision Screening - Comments:: Up to date [facility]      Goals Addressed             This Visit's Progress    Patient Stated       Get back to walking once the weather gets better     Quit Smoking   Not on track      Depression Screen    01/08/2024    8:43 AM 12/03/2022    8:25 AM 05/23/2021    3:32 PM 10/26/2020   12:34 AM 04/10/2020    7:14 PM 01/04/2020   11:41 AM 09/27/2019   11:54 PM  PHQ 2/9 Scores  PHQ - 2 Score 2 0 0 0 0 0 0  PHQ- 9 Score 2           Fall Risk    01/08/2024    8:29 AM 12/03/2022    8:24 AM 05/09/2022   10:30 AM 05/23/2021    3:32 PM 10/26/2020   12:34 AM  Fall Risk   Falls in the past year? 0 0 0 0 0  Number falls in past yr: 0 0 0 0   Injury with Fall? 0 0 0 0   Risk for fall due to :    No Fall Risks No Fall Risks  Follow up Falls evaluation completed;Education provided;Falls prevention discussed Falls prevention discussed;Education provided;Falls evaluation completed  Falls prevention discussed;Falls evaluation completed Falls evaluation completed;Education provided;Falls prevention discussed    MEDICARE RISK AT HOME: Medicare Risk at Home Any stairs in or around the home?: Yes If so, are there any without handrails?: No Home free of loose throw rugs in walkways, pet beds, electrical cords, etc?: Yes Adequate lighting in your home to reduce risk of falls?: Yes Life alert?: No Use of a cane, walker or w/c?: No Grab bars in the bathroom?: Yes Shower chair or bench in shower?: No Elevated toilet seat or a handicapped toilet?: No  TIMED UP AND GO:  Was the test performed?  No    Cognitive Function:        01/08/2024    8:37 AM 12/03/2022    8:28 AM  6CIT Screen  What Year? 0 points 0 points  What month? 0 points 0 points  What time? 0 points 0 points  Count back from 20 0 points 0 points  Months in reverse 2 points 0 points  Repeat phrase  4 points 0 points  Total Score 6 points 0 points    Immunizations Immunization History  Administered Date(s) Administered   PPD Test 12/13/2013, 12/14/2014, 01/11/2016, 02/28/2017   Pneumococcal Conjugate-13 08/17/2018   Pneumococcal Polysaccharide-23 09/28/2019   Pneumococcal-Unspecified 11/24/2008   Td 11/11/2004   Tdap 12/14/2014    TDAP status: Up to date  Flu Vaccine status: Due, Education has been provided regarding the importance of this vaccine. Advised may receive this vaccine at local pharmacy or Health Dept. Aware to provide a copy of the  vaccination record if obtained from local pharmacy or Health Dept. Verbalized acceptance and understanding.  Pneumococcal vaccine status: Up to date  Covid-19 vaccine status: Information provided on how to obtain vaccines.   Qualifies for Shingles Vaccine? Yes   Zostavax completed No   Shingrix Completed?: No.    Education has been provided regarding the importance of this vaccine. Patient has been advised to call insurance company to determine out of pocket expense if they have not yet received this vaccine. Advised may also receive vaccine at local pharmacy or Health Dept. Verbalized acceptance and understanding.  Screening Tests Health Maintenance  Topic Date Due   COVID-19 Vaccine (1) Never done   Zoster Vaccines- Shingrix (1 of 2) Never done   INFLUENZA VACCINE  02/09/2024 (Originally 06/12/2023)   Lung Cancer Screening  03/12/2024   DTaP/Tdap/Td (3 - Td or Tdap) 12/14/2024   Medicare Annual Wellness (AWV)  01/07/2025   Colonoscopy  01/01/2026   Pneumonia Vaccine 1+ Years old  Completed   Hepatitis C Screening  Completed   HPV VACCINES  Aged Out   Fecal DNA (Cologuard)  Discontinued    Health Maintenance  Health Maintenance Due  Topic Date Due   COVID-19 Vaccine (1) Never done   Zoster Vaccines- Shingrix (1 of 2) Never done    Colorectal cancer screening: Type of screening: Colonoscopy. Completed 2022. Repeat every 5 years  Lung Cancer Screening: (Low Dose CT Chest recommended if Age 71-80 years, 20 pack-year currently smoking OR have quit w/in 15years  Lung Cancer Screening Referral:   Additional Screening:  Hepatitis C Screening: does not qualify; Completed 2015  Vision Screening: Recommended annual ophthalmology exams for early detection of glaucoma and other disorders of the eye. Is the patient up to date with their annual eye exam?  Yes  Who is the provider or what is the name of the office in which the patient attends annual eye exams? [facility] If pt is not  established with a provider, would they like to be referred to a provider to establish care? No .   Dental Screening: Recommended annual dental exams for proper oral hygiene    Community Resource Referral / Chronic Care Management: CRR required this visit?  No   CCM required this visit?  No     Plan:     I have personally reviewed and noted the following in the patient's chart:   Medical and social history Use of alcohol, tobacco or illicit drugs  Current medications and supplements including opioid prescriptions. Patient is not currently taking opioid prescriptions. Functional ability and status Nutritional status Physical activity Advanced directives List of other physicians Hospitalizations, surgeries, and ER visits in previous 12 months Vitals Screenings to include cognitive, depression, and falls Referrals and appointments  In addition, I have reviewed and discussed with patient certain preventive protocols, quality metrics, and best practice recommendations. A written personalized care plan for preventive services as well as general preventive health recommendations  were provided to patient.     Remi Haggard, LPN   12/11/8655   After Visit Summary: (MyChart) Due to this being a telephonic visit, the after visit summary with patients personalized plan was offered to patient via MyChart   Nurse Notes:

## 2024-01-08 NOTE — Patient Instructions (Signed)
 Steven Bray , Thank you for taking time to come for your Medicare Wellness Visit. I appreciate your ongoing commitment to your health goals. Please review the following plan we discussed and let me know if I can assist you in the future.   Screening recommendations/referrals: Colonoscopy: up to date Recommended yearly ophthalmology/optometry visit for glaucoma screening and checkup Recommended yearly dental visit for hygiene and checkup  Vaccinations: Influenza vaccine: Education provided Pneumococcal vaccine: up to date Tdap vaccine: up to date Shingles vaccine: Education provided    Advanced directives: Education provided      Preventive Care 65 Years and Older, Male Preventive care refers to lifestyle choices and visits with your health care provider that can promote health and wellness. What does preventive care include? A yearly physical exam. This is also called an annual well check. Dental exams once or twice a year. Routine eye exams. Ask your health care provider how often you should have your eyes checked. Personal lifestyle choices, including: Daily care of your teeth and gums. Regular physical activity. Eating a healthy diet. Avoiding tobacco and drug use. Limiting alcohol use. Practicing safe sex. Taking low doses of aspirin every day. Taking vitamin and mineral supplements as recommended by your health care provider. What happens during an annual well check? The services and screenings done by your health care provider during your annual well check will depend on your age, overall health, lifestyle risk factors, and family history of disease. Counseling  Your health care provider may ask you questions about your: Alcohol use. Tobacco use. Drug use. Emotional well-being. Home and relationship well-being. Sexual activity. Eating habits. History of falls. Memory and ability to understand (cognition). Work and work Astronomer. Screening  You may have the  following tests or measurements: Height, weight, and BMI. Blood pressure. Lipid and cholesterol levels. These may be checked every 5 years, or more frequently if you are over 71 years old. Skin check. Lung cancer screening. You may have this screening every year starting at age 71 if you have a 30-pack-year history of smoking and currently smoke or have quit within the past 15 years. Fecal occult blood test (FOBT) of the stool. You may have this test every year starting at age 71. Flexible sigmoidoscopy or colonoscopy. You may have a sigmoidoscopy every 5 years or a colonoscopy every 10 years starting at age 71. Prostate cancer screening. Recommendations will vary depending on your family history and other risks. Hepatitis C blood test. Hepatitis B blood test. Sexually transmitted disease (STD) testing. Diabetes screening. This is done by checking your blood sugar (glucose) after you have not eaten for a while (fasting). You may have this done every 1-3 years. Abdominal aortic aneurysm (AAA) screening. You may need this if you are a current or former smoker. Osteoporosis. You may be screened starting at age 71 if you are at high risk. Talk with your health care provider about your test results, treatment options, and if necessary, the need for more tests. Vaccines  Your health care provider may recommend certain vaccines, such as: Influenza vaccine. This is recommended every year. Tetanus, diphtheria, and acellular pertussis (Tdap, Td) vaccine. You may need a Td booster every 10 years. Zoster vaccine. You may need this after age 71. Pneumococcal 13-valent conjugate (PCV13) vaccine. One dose is recommended after age 71. Pneumococcal polysaccharide (PPSV23) vaccine. One dose is recommended after age 71. Talk to your health care provider about which screenings and vaccines you need and how often you need them.  This information is not intended to replace advice given to you by your health care  provider. Make sure you discuss any questions you have with your health care provider. Document Released: 11/24/2015 Document Revised: 07/17/2016 Document Reviewed: 08/29/2015 Elsevier Interactive Patient Education  2017 [employer].  Fall Prevention in the Home Falls can cause injuries. They can happen to people of all ages. There are many things you can do to make your home safe and to help prevent falls. What can I do on the outside of my home? Regularly fix the edges of walkways and driveways and fix any cracks. Remove anything that might make you trip as you walk through a door, such as a raised step or threshold. Trim any bushes or trees on the path to your home. Use bright outdoor lighting. Clear any walking paths of anything that might make someone trip, such as rocks or tools. Regularly check to see if handrails are loose or broken. Make sure that both sides of any steps have handrails. Any raised decks and porches should have guardrails on the edges. Have any leaves, snow, or ice cleared regularly. Use sand or salt on walking paths during winter. Clean up any spills in your garage right away. This includes oil or grease spills. What can I do in the bathroom? Use night lights. Install grab bars by the toilet and in the tub and shower. Do not use towel bars as grab bars. Use non-skid mats or decals in the tub or shower. If you need to sit down in the shower, use a plastic, non-slip stool. Keep the floor dry. Clean up any water that spills on the floor as soon as it happens. Remove soap buildup in the tub or shower regularly. Attach bath mats securely with double-sided non-slip rug tape. Do not have throw rugs and other things on the floor that can make you trip. What can I do in the bedroom? Use night lights. Make sure that you have a light by your bed that is easy to reach. Do not use any sheets or blankets that are too big for your bed. They should not hang down onto the  floor. Have a firm chair that has side arms. You can use this for support while you get dressed. Do not have throw rugs and other things on the floor that can make you trip. What can I do in the kitchen? Clean up any spills right away. Avoid walking on wet floors. Keep items that you use a lot in easy-to-reach places. If you need to reach something above you, use a strong step stool that has a grab bar. Keep electrical cords out of the way. Do not use floor polish or wax that makes floors slippery. If you must use wax, use non-skid floor wax. Do not have throw rugs and other things on the floor that can make you trip. What can I do with my stairs? Do not leave any items on the stairs. Make sure that there are handrails on both sides of the stairs and use them. Fix handrails that are broken or loose. Make sure that handrails are as long as the stairways. Check any carpeting to make sure that it is firmly attached to the stairs. Fix any carpet that is loose or worn. Avoid having throw rugs at the top or bottom of the stairs. If you do have throw rugs, attach them to the floor with carpet tape. Make sure that you have a light switch at the  top of the stairs and the bottom of the stairs. If you do not have them, ask someone to add them for you. What else can I do to help prevent falls? Wear shoes that: Do not have high heels. Have rubber bottoms. Are comfortable and fit you well. Are closed at the toe. Do not wear sandals. If you use a stepladder: Make sure that it is fully opened. Do not climb a closed stepladder. Make sure that both sides of the stepladder are locked into place. Ask someone to hold it for you, if possible. Clearly mark and make sure that you can see: Any grab bars or handrails. First and last steps. Where the edge of each step is. Use tools that help you move around (mobility aids) if they are needed. These include: Canes. Walkers. Scooters. Crutches. Turn on the  lights when you go into a dark area. Replace any light bulbs as soon as they burn out. Set up your furniture so you have a clear path. Avoid moving your furniture around. If any of your floors are uneven, fix them. If there are any pets around you, be aware of where they are. Review your medicines with your doctor. Some medicines can make you feel dizzy. This can increase your chance of falling. Ask your doctor what other things that you can do to help prevent falls. This information is not intended to replace advice given to you by your health care provider. Make sure you discuss any questions you have with your health care provider. Document Released: 08/24/2009 Document Revised: 04/04/2016 Document Reviewed: 12/02/2014 Elsevier Interactive Patient Education  2017 [employer].

## 2024-01-11 ENCOUNTER — Other Ambulatory Visit: Payer: Self-pay | Admitting: Family Medicine

## 2024-01-11 DIAGNOSIS — N320 Bladder-neck obstruction: Secondary | ICD-10-CM

## 2024-01-11 DIAGNOSIS — K219 Gastro-esophageal reflux disease without esophagitis: Secondary | ICD-10-CM

## 2024-01-15 ENCOUNTER — Encounter: Payer: Self-pay | Admitting: *Deleted

## 2024-02-25 DIAGNOSIS — Z85828 Personal history of other malignant neoplasm of skin: Secondary | ICD-10-CM | POA: Diagnosis not present

## 2024-02-25 DIAGNOSIS — Z08 Encounter for follow-up examination after completed treatment for malignant neoplasm: Secondary | ICD-10-CM | POA: Diagnosis not present

## 2024-02-25 DIAGNOSIS — L570 Actinic keratosis: Secondary | ICD-10-CM | POA: Diagnosis not present

## 2024-02-25 DIAGNOSIS — L918 Other hypertrophic disorders of the skin: Secondary | ICD-10-CM | POA: Diagnosis not present

## 2024-02-25 DIAGNOSIS — D225 Melanocytic nevi of trunk: Secondary | ICD-10-CM | POA: Diagnosis not present

## 2024-02-25 DIAGNOSIS — B078 Other viral warts: Secondary | ICD-10-CM | POA: Diagnosis not present

## 2024-02-25 DIAGNOSIS — X32XXXA Exposure to sunlight, initial encounter: Secondary | ICD-10-CM | POA: Diagnosis not present

## 2024-02-25 DIAGNOSIS — Z1283 Encounter for screening for malignant neoplasm of skin: Secondary | ICD-10-CM | POA: Diagnosis not present

## 2024-03-03 DIAGNOSIS — H2513 Age-related nuclear cataract, bilateral: Secondary | ICD-10-CM | POA: Diagnosis not present

## 2024-03-03 DIAGNOSIS — H1045 Other chronic allergic conjunctivitis: Secondary | ICD-10-CM | POA: Diagnosis not present

## 2024-03-03 DIAGNOSIS — H0102B Squamous blepharitis left eye, upper and lower eyelids: Secondary | ICD-10-CM | POA: Diagnosis not present

## 2024-03-03 DIAGNOSIS — H0102A Squamous blepharitis right eye, upper and lower eyelids: Secondary | ICD-10-CM | POA: Diagnosis not present

## 2024-03-03 DIAGNOSIS — H53002 Unspecified amblyopia, left eye: Secondary | ICD-10-CM | POA: Diagnosis not present

## 2024-03-03 DIAGNOSIS — H04123 Dry eye syndrome of bilateral lacrimal glands: Secondary | ICD-10-CM | POA: Diagnosis not present

## 2024-03-12 ENCOUNTER — Other Ambulatory Visit: Payer: Self-pay | Admitting: Family Medicine

## 2024-03-12 DIAGNOSIS — R600 Localized edema: Secondary | ICD-10-CM

## 2024-03-12 NOTE — Telephone Encounter (Signed)
 Prescription Request  03/12/2024  LOV: Visit date not found  What is the name of the medication or equipment? furosemide  (LASIX ) 40 MG tablet   Have you contacted your pharmacy to request a refill? Yes   Which pharmacy would you like this sent to?  [facility] - [place], [place] - [address] [place] [address] Phone: [phone] Fax: [phone]    Patient notified that their request is being sent to the clinical staff for review and that they should receive a response within 2 business days.   Please advise at [facility] [phone]

## 2024-03-16 DIAGNOSIS — I872 Venous insufficiency (chronic) (peripheral): Secondary | ICD-10-CM | POA: Diagnosis not present

## 2024-03-16 DIAGNOSIS — M79662 Pain in left lower leg: Secondary | ICD-10-CM | POA: Diagnosis not present

## 2024-03-16 DIAGNOSIS — M79605 Pain in left leg: Secondary | ICD-10-CM | POA: Diagnosis not present

## 2024-03-16 DIAGNOSIS — M79661 Pain in right lower leg: Secondary | ICD-10-CM | POA: Diagnosis not present

## 2024-03-16 DIAGNOSIS — M79604 Pain in right leg: Secondary | ICD-10-CM | POA: Diagnosis not present

## 2024-03-16 MED ORDER — FUROSEMIDE 40 MG PO TABS: ORAL_TABLET | ORAL | 3 refills | Status: AC

## 2024-03-16 NOTE — Telephone Encounter (Signed)
 Requested Prescriptions  Pending Prescriptions Disp Refills   furosemide  (LASIX ) 40 MG tablet 180 tablet 3    Sig: Take 1 tablet by mouth 1 to 2 times per day for blood pressure and fluid retention/ankle swelling     Cardiovascular:  Diuretics - Loop Failed - 03/16/2024  8:23 AM      Failed - Mg Level in normal range and within 180 days    Magnesium  Date Value Ref Range Status  02/21/2022 2.0 1.5 - 2.5 mg/dL Final         Passed - K in normal range and within 180 days    Potassium  Date Value Ref Range Status  11/03/2023 4.4 3.5 - 5.2 mmol/L Final         Passed - Ca in normal range and within 180 days    Calcium   Date Value Ref Range Status  11/03/2023 9.5 8.6 - 10.2 mg/dL Final         Passed - Na in normal range and within 180 days    Sodium  Date Value Ref Range Status  11/03/2023 140 134 - 144 mmol/L Final         Passed - Cr in normal range and within 180 days    Creat  Date Value Ref Range Status  12/10/2022 1.03 0.70 - 1.35 mg/dL Final   Creatinine, Ser  Date Value Ref Range Status  11/03/2023 1.23 0.76 - 1.27 mg/dL Final   Creatinine, Urine  Date Value Ref Range Status  11/14/2021 58 20 - 320 mg/dL Final         Passed - Cl in normal range and within 180 days    Chloride  Date Value Ref Range Status  11/03/2023 101 96 - 106 mmol/L Final         Passed - Last BP in normal range    BP Readings from Last 1 Encounters:  11/03/23 128/74         Passed - Valid encounter within last 6 months    Recent Outpatient Visits           1 year ago Essential hypertension   [place] [facility] Austine Lefort, MD   1 year ago Thoracic aortic atherosclerosis [facility]) - per CT 03/20/2022   [place] [facility] Pickard, Cisco Crest, MD

## 2024-03-30 DIAGNOSIS — I83891 Varicose veins of right lower extremities with other complications: Secondary | ICD-10-CM | POA: Diagnosis not present

## 2024-04-06 DIAGNOSIS — I83891 Varicose veins of right lower extremities with other complications: Secondary | ICD-10-CM | POA: Diagnosis not present

## 2024-04-14 DIAGNOSIS — I83891 Varicose veins of right lower extremities with other complications: Secondary | ICD-10-CM | POA: Diagnosis not present

## 2024-04-14 DIAGNOSIS — I83811 Varicose veins of right lower extremities with pain: Secondary | ICD-10-CM | POA: Diagnosis not present

## 2024-04-27 DIAGNOSIS — I83892 Varicose veins of left lower extremities with other complications: Secondary | ICD-10-CM | POA: Diagnosis not present

## 2024-04-28 DIAGNOSIS — I83892 Varicose veins of left lower extremities with other complications: Secondary | ICD-10-CM | POA: Diagnosis not present

## 2024-04-28 DIAGNOSIS — I83812 Varicose veins of left lower extremities with pain: Secondary | ICD-10-CM | POA: Diagnosis not present

## 2024-04-28 DIAGNOSIS — Z09 Encounter for follow-up examination after completed treatment for conditions other than malignant neoplasm: Secondary | ICD-10-CM | POA: Diagnosis not present

## 2024-05-11 DIAGNOSIS — M7989 Other specified soft tissue disorders: Secondary | ICD-10-CM | POA: Diagnosis not present

## 2024-05-11 DIAGNOSIS — I83892 Varicose veins of left lower extremities with other complications: Secondary | ICD-10-CM | POA: Diagnosis not present

## 2024-05-11 DIAGNOSIS — I83812 Varicose veins of left lower extremities with pain: Secondary | ICD-10-CM | POA: Diagnosis not present

## 2024-06-28 ENCOUNTER — Other Ambulatory Visit: Payer: Self-pay | Admitting: Family Medicine

## 2024-06-28 DIAGNOSIS — I5030 Unspecified diastolic (congestive) heart failure: Secondary | ICD-10-CM

## 2024-06-28 DIAGNOSIS — I10 Essential (primary) hypertension: Secondary | ICD-10-CM

## 2024-06-30 NOTE — Telephone Encounter (Signed)
 Requested medication (s) are due for refill today: yes  Requested medication (s) are on the active medication list: yes  Last refill:  10/21/23 #60 5 RF  Future visit scheduled: yes  Notes to clinic:   overdue lab work    Requested Prescriptions  Pending Prescriptions Disp Refills   spironolactone  (ALDACTONE ) 25 MG tablet [Pharmacy Med Name: Spironolactone  25 MG Oral Tablet] 100 tablet 2    Sig: TAKE 1 TABLET BY MOUTH DAILY     Cardiovascular: Diuretics - Aldosterone Antagonist Failed - 06/30/2024  7:47 AM      Failed - Cr in normal range and within 180 days    Creat  Date Value Ref Range Status  12/10/2022 1.03 0.70 - 1.35 mg/dL Final   Creatinine, Ser  Date Value Ref Range Status  11/03/2023 1.23 0.76 - 1.27 mg/dL Final   Creatinine, Urine  Date Value Ref Range Status  11/14/2021 58 20 - 320 mg/dL Final         Failed - K in normal range and within 180 days    Potassium  Date Value Ref Range Status  11/03/2023 4.4 3.5 - 5.2 mmol/L Final         Failed - Na in normal range and within 180 days    Sodium  Date Value Ref Range Status  11/03/2023 140 134 - 144 mmol/L Final         Failed - eGFR is 30 or above and within 180 days    GFR, Est African American  Date Value Ref Range Status  02/07/2021 88 > OR = 60 mL/min/1.19m2 Final   GFR, Est Non African American  Date Value Ref Range Status  02/07/2021 76 > OR = 60 mL/min/1.35m2 Final   eGFR  Date Value Ref Range Status  11/03/2023 63 >59 mL/min/1.73 Final         Passed - Last BP in normal range    BP Readings from Last 1 Encounters:  11/03/23 128/74         Passed - Valid encounter within last 6 months    Recent Outpatient Visits           1 year ago Essential hypertension   [place] [facility] Duanne Butler DASEN, MD   2 years ago Thoracic aortic atherosclerosis [facility]) - per CT 03/20/2022   [place] [facility] Pickard, Butler DASEN, MD

## 2024-07-05 ENCOUNTER — Encounter: Payer: Self-pay | Admitting: Family Medicine

## 2024-07-05 ENCOUNTER — Ambulatory Visit (INDEPENDENT_AMBULATORY_CARE_PROVIDER_SITE_OTHER): Admitting: Family Medicine

## 2024-07-05 VITALS — BP 138/84 | HR 53 | Ht 69.0 in | Wt 219.6 lb

## 2024-07-05 DIAGNOSIS — K5909 Other constipation: Secondary | ICD-10-CM

## 2024-07-05 DIAGNOSIS — I10 Essential (primary) hypertension: Secondary | ICD-10-CM

## 2024-07-05 NOTE — Progress Notes (Signed)
 Subjective:    Patient ID: Steven FORBES Malinda Mickey., male    DOB: [birth year], 73 y.o.   MRN: [id number]  Abdominal Pain  Patient states that he has not had a normal bowel movement in over a month.  He does report some mild abdominal discomfort generalized all throughout the abdomen.  He states that he has tried for few months without benefit.  He takes MiraLAX on a daily basis.  He denies any blood.  He denies any melena.  He denies any nausea or vomiting. Past Medical History:  Diagnosis Date   Arthritis    ED (erectile dysfunction)    GERD (gastroesophageal reflux disease)    Hyperlipidemia    Hypertension    Pre-diabetes    Stroke (HCC)    TIA - around age 57   TIA (transient ischemic attack)    Vitamin D  deficiency    Past Surgical History:  Procedure Laterality Date   HERNIA REPAIR Right    INGUINAL   Current Outpatient Medications on File Prior to Visit  Medication Sig Dispense Refill   aspirin EC 81 MG tablet Take 162 mg by mouth daily.     atenolol  (TENORMIN ) 25 MG tablet TAKE 1 TABLET BY MOUTH TWICE  DAILY FOR BLOOD PRESSURE 200 tablet 2   atorvastatin  (LIPITOR) 40 MG tablet TAKE 1 TABLET BY MOUTH 4 TIMES  WEEKLY ON MONDAY WEDNESDAY  FRIDAY AND SATURDAY FOR  CHOLESTEROL 58 tablet 2   Cyanocobalamin (VITAMIN B 12) 500 MCG TABS Take 1 tablet by mouth daily.     furosemide  (LASIX ) 40 MG tablet Take 1 tablet by mouth 1 to 2 times per day for blood pressure and fluid retention/ankle swelling 180 tablet 3   meloxicam  (MOBIC ) 15 MG tablet TAKE 1/2 TO 1 TABLET BY MOUTH  DAILY WITH FOOD FOR PAIN AND  INFLAMMATION 100 tablet .   pantoprazole  (PROTONIX ) 40 MG tablet TAKE 1 TABLET BY MOUTH DAILY TO  PREVENT HEARTBURN AND  INDIGESTION 100 tablet 2   spironolactone  (ALDACTONE ) 25 MG tablet TAKE 1 TABLET BY MOUTH DAILY 60 tablet 5   tadalafil  (CIALIS ) 20 MG tablet Take 1/2 to 1 tablet every 2 to 3 days as needed for XXXX 30 tablet 12   tamsulosin  (FLOMAX ) 0.4 MG CAPS capsule TAKE 1  CAPSULE BY MOUTH DAILY  FOR PROSTATE 100 capsule 2   VITAMIN D  PO Take 10,000 Units by mouth 4 (four) times a week. MON, WED, FRI & SAT     No current facility-administered medications on file prior to visit.   Allergies  Allergen Reactions   Sulfa Antibiotics Nausea Only   Social History   Socioeconomic History   Marital status: Single    Spouse name: Not on file   Number of children: Not on file   Years of education: Not on file   Highest education level: Not on file  Occupational History   Not on file  Tobacco Use   Smoking status: Former    Current packs/day: 0.00    Average packs/day: 0.7 packs/day for 52.8 years (34.3 ttl pk-yrs)    Types: Cigarettes    Start date: 83    Quit date: 09/2021    Years since quitting: 2.8   Smokeless tobacco: Never  Vaping Use   Vaping status: Never Used  Substance and Sexual Activity   Alcohol use: No    Alcohol/week: 0.0 standard drinks of alcohol   Drug use: No   Sexual activity: Yes  Birth control/protection: None  Other Topics Concern   Not on file  Social History Narrative   Not on file   Social Drivers of Health   Financial Resource Strain: Low Risk  (01/08/2024)   Overall Financial Resource Strain (CARDIA)    Difficulty of Paying Living Expenses: Not hard at all  Food Insecurity: No Food Insecurity (01/08/2024)   Hunger Vital Sign    Worried About Running Out of Food in the Last Year: Never true    Ran Out of Food in the Last Year: Never true  Transportation Needs: No Transportation Needs (01/08/2024)   PRAPARE - Administrator, Civil Service (Medical): No    Lack of Transportation (Non-Medical): No  Physical Activity: Inactive (01/08/2024)   Exercise Vital Sign    Days of Exercise per Week: 0 days    Minutes of Exercise per Session: 0 min  Stress: No Stress Concern Present (01/08/2024)   [employer] of Occupational Health - Occupational Stress Questionnaire    Feeling of Stress : Not at all   Social Connections: Moderately Integrated (01/08/2024)   Social Connection and Isolation Panel    Frequency of Communication with Friends and Family: More than three times a week    Frequency of Social Gatherings with Friends and Family: More than three times a week    Attends Religious Services: More than 4 times per year    Active Member of [employer] or Organizations: Yes    Attends Banker Meetings: More than 4 times per year    Marital Status: Widowed  Intimate Partner Violence: Not At Risk (01/08/2024)   Humiliation, Afraid, Rape, and Kick questionnaire    Fear of Current or Ex-Partner: No    Emotionally Abused: No    Physically Abused: No    Sexually Abused: No   Family History  Problem Relation Age of Onset   Cancer Mother        LYMPHOMA   Hyperlipidemia Mother    Hypertension Mother    Diabetes Father    Hypertension Father    Stroke Father    Kidney disease Father    Diabetes Sister    Drug abuse Son        DIED FROM OD   Early death Daughter    Prostate cancer Maternal Uncle    Colon cancer Neg Hx    Esophageal cancer Neg Hx    Pancreatic cancer Neg Hx    Rectal cancer Neg Hx    Stomach cancer Neg Hx      Review of Systems  Gastrointestinal:  Positive for abdominal pain.       Objective:   Physical Exam Constitutional:      General: He is not in acute distress.    Appearance: Normal appearance. He is not ill-appearing, toxic-appearing or diaphoretic.  HENT:     Right Ear: Tympanic membrane and ear canal normal.     Left Ear: Tympanic membrane and ear canal normal.     Nose: No congestion or rhinorrhea.     Mouth/Throat:     Pharynx: Oropharynx is clear. No oropharyngeal exudate or posterior oropharyngeal erythema.  Eyes:     Extraocular Movements: Extraocular movements intact.     Conjunctiva/sclera: Conjunctivae normal.     Pupils: Pupils are equal, round, and reactive to light.  Neck:     Vascular: No carotid bruit.  Cardiovascular:      Rate and Rhythm: Normal rate and regular rhythm.  Heart sounds: Normal heart sounds. No murmur heard.    No friction rub. No gallop.  Pulmonary:     Effort: Pulmonary effort is normal. No respiratory distress.     Breath sounds: Normal breath sounds. No stridor. No wheezing, rhonchi or rales.  Abdominal:     General: Abdomen is flat. Bowel sounds are normal. There is no distension.     Palpations: Abdomen is soft.     Tenderness: There is no abdominal tenderness. There is no guarding or rebound.  Musculoskeletal:     Right lower leg: Edema present.     Left lower leg: Edema present.  Lymphadenopathy:     Cervical: No cervical adenopathy.  Skin:    Findings: No rash.  Neurological:     General: No focal deficit present.     Mental Status: He is alert and oriented to person, place, and time.     Cranial Nerves: No cranial nerve deficit.     Motor: No weakness.     Coordination: Coordination normal.     Gait: Gait normal.     Deep Tendon Reflexes: Reflexes normal.  Psychiatric:        Mood and Affect: Mood normal.        Behavior: Behavior normal.           Assessment & Plan:  Primary hypertension - Plan: CBC with Differential/Platelet, Comprehensive metabolic panel with GFR, Lipid panel  Chronic constipation There is no evidence of an acute abdomen.  Trial of Linzess 145 mcg p.o. daily.  I gave him 1 week worth of samples.  If not working within 1 week I would add a stimulant laxative such as Ex-Lax or Dulcolax in addition.  While the patient is here today I will check a CBC a CMP and a lipid panel

## 2024-07-06 ENCOUNTER — Ambulatory Visit: Payer: Self-pay | Admitting: Family Medicine

## 2024-07-06 LAB — COMPREHENSIVE METABOLIC PANEL WITH GFR
AG Ratio: 1.6 (calc) (ref 1.0–2.5)
ALT: 12 U/L (ref 9–46)
AST: 17 U/L (ref 10–35)
Albumin: 4.1 g/dL (ref 3.6–5.1)
Alkaline phosphatase (APISO): 77 U/L (ref 35–144)
BUN: 20 mg/dL (ref 7–25)
CO2: 29 mmol/L (ref 20–32)
Calcium: 9.4 mg/dL (ref 8.6–10.3)
Chloride: 104 mmol/L (ref 98–110)
Creat: 1.04 mg/dL (ref 0.70–1.28)
Globulin: 2.6 g/dL (ref 1.9–3.7)
Glucose, Bld: 94 mg/dL (ref 65–99)
Potassium: 3.9 mmol/L (ref 3.5–5.3)
Sodium: 140 mmol/L (ref 135–146)
Total Bilirubin: 1.2 mg/dL (ref 0.2–1.2)
Total Protein: 6.7 g/dL (ref 6.1–8.1)
eGFR: 77 mL/min/1.73m2 (ref >=60)

## 2024-07-06 LAB — CBC WITH DIFFERENTIAL/PLATELET
Absolute Lymphocytes: 914 {cells}/uL (ref 850–3900)
Absolute Monocytes: 529 {cells}/uL (ref 200–950)
Basophils Absolute: 29 {cells}/uL (ref 0–200)
Basophils Relative: 0.7 %
Eosinophils Absolute: 119 {cells}/uL (ref 15–500)
Eosinophils Relative: 2.9 %
HCT: 43 % (ref 38.5–50.0)
Hemoglobin: 14.3 g/dL (ref 13.2–17.1)
MCH: 32.4 pg (ref 27.0–33.0)
MCHC: 33.3 g/dL (ref 32.0–36.0)
MCV: 97.3 fL (ref 80.0–100.0)
MPV: 10.4 fL (ref 7.5–12.5)
Monocytes Relative: 12.9 %
Neutro Abs: 2509 {cells}/uL (ref 1500–7800)
Neutrophils Relative %: 61.2 %
Platelets: 221 Thousand/uL (ref 140–400)
RBC: 4.42 Million/uL (ref 4.20–5.80)
RDW: 12.2 % (ref 11.0–15.0)
Total Lymphocyte: 22.3 %
WBC: 4.1 Thousand/uL (ref 3.8–10.8)

## 2024-07-06 LAB — LIPID PANEL
Cholesterol: 121 mg/dL (ref <200)
HDL: 34 mg/dL — ABNORMAL LOW (ref >=40)
LDL Cholesterol (Calc): 65 mg/dL
Non-HDL Cholesterol (Calc): 87 mg/dL (ref <130)
Total CHOL/HDL Ratio: 3.6 (calc) (ref <5.0)
Triglycerides: 132 mg/dL (ref <150)

## 2024-07-14 ENCOUNTER — Telehealth: Payer: Self-pay

## 2024-07-14 NOTE — Telephone Encounter (Signed)
 Patient states the Linzess 145 MG 4 tablet  that was given at the last appointment (2 trial boxes 4 pills each ) he has taken both boxes.  For 8 days he had diarrhea. He states it was not severe, but uncomfortable. He was going to the restroom 2-3 times daily while  taking the linzess .   Since stopping the linzess the diarrhea has stopped.  He claims  after the linzess ran out, he started taking the Miralax again. For the last two days, He as not had a proper bowel movement.  He would like a recommendation of what would be  the best course of treatment. For keeping him regular.  Pt. Wants also wants to know if he needs to add a second pill per day to his Flomax  to keep him urinating properly? Or does he need to try something different. There was a previous message about this but he wanted to add it to this message today as well.   Copied from CRM #[id number]. Topic: Clinical - Prescription Issue >> Jul 14, 2024 10:15 AM Pinkey ORN wrote: Reason for CRM: furosemide  (LASIX ) 40 MG tablet >> Jul 14, 2024 10:16 AM Pinkey ORN wrote: Patient states the furosemide  (LASIX ) 40 MG tablet isn't working, it only gave him diarrhea.

## 2024-07-15 NOTE — Telephone Encounter (Signed)
 Spoke to pt by phone he understood instructions will try to start senokot and will report back if nothing change.

## 2024-07-28 ENCOUNTER — Other Ambulatory Visit: Payer: Self-pay | Admitting: Family Medicine

## 2024-07-28 DIAGNOSIS — I10 Essential (primary) hypertension: Secondary | ICD-10-CM

## 2024-08-24 ENCOUNTER — Other Ambulatory Visit: Payer: Self-pay

## 2024-08-24 ENCOUNTER — Ambulatory Visit: Admitting: Family Medicine

## 2024-08-24 ENCOUNTER — Encounter: Payer: Self-pay | Admitting: Family Medicine

## 2024-08-24 VITALS — BP 130/76 | HR 55 | Ht 69.0 in | Wt 222.8 lb

## 2024-08-24 DIAGNOSIS — Z0001 Encounter for general adult medical examination with abnormal findings: Secondary | ICD-10-CM | POA: Diagnosis not present

## 2024-08-24 DIAGNOSIS — E782 Mixed hyperlipidemia: Secondary | ICD-10-CM | POA: Diagnosis not present

## 2024-08-24 DIAGNOSIS — Z23 Encounter for immunization: Secondary | ICD-10-CM

## 2024-08-24 DIAGNOSIS — I700 Atherosclerosis of aorta: Secondary | ICD-10-CM

## 2024-08-24 DIAGNOSIS — I10 Essential (primary) hypertension: Secondary | ICD-10-CM

## 2024-08-24 DIAGNOSIS — Z125 Encounter for screening for malignant neoplasm of prostate: Secondary | ICD-10-CM | POA: Diagnosis not present

## 2024-08-24 DIAGNOSIS — Z122 Encounter for screening for malignant neoplasm of respiratory organs: Secondary | ICD-10-CM | POA: Diagnosis not present

## 2024-08-24 DIAGNOSIS — Z0000 Encounter for general adult medical examination without abnormal findings: Secondary | ICD-10-CM

## 2024-08-24 LAB — PSA: PSA: 0.53 ng/mL (ref <=4.00)

## 2024-08-24 NOTE — Progress Notes (Signed)
 Subjective:    Patient ID: Steven FORBES Malinda Mickey., male    DOB: [birth year], 73 y.o.   MRN: [id number]  HPI Patient is a very pleasant 73 year old Caucasian gentleman here today to for CPE.   He also has a history of stroke that occurred in his 60s.  For that reason he takes an aspirin every day along with atorvastatin .    He had an echocardiogram that showed an ejection fraction of 60% and mild diastolic dysfunction.  The patient had a CT scan of his lungs in 2023 screening for lung cancer.  He has greater than 20-pack-year history of smoking.  He quit in 2022.  Although there was no evidence of lung cancer, he was found to have a thoracic aortic aneurysm.  He is due to recheck this.  He is currently on spironolactone  for diastolic dysfunction.  He denies any symptoms of heart failure at the present time specifically shortness of breath or dyspnea on exertion.  His most recent lab work in August was outstanding Office Visit on 07/05/2024  Component Date Value Ref Range Status   WBC 07/05/2024 4.1  3.8 - 10.8 Thousand/uL Final   RBC 07/05/2024 4.42  4.20 - 5.80 Million/uL Final   Hemoglobin 07/05/2024 14.3  13.2 - 17.1 g/dL Final   HCT 91/74/7974 43.0  38.5 - 50.0 % Final   MCV 07/05/2024 97.3  80.0 - 100.0 fL Final   MCH 07/05/2024 32.4  27.0 - 33.0 pg Final   MCHC 07/05/2024 33.3  32.0 - 36.0 g/dL Final   Comment: For adults, a slight decrease in the calculated MCHC value (in the range of 30 to 32 g/dL) is most likely not clinically significant; however, it should be interpreted with caution in correlation with other red cell parameters and the patient's clinical condition.    RDW 07/05/2024 12.2  11.0 - 15.0 % Final   Platelets 07/05/2024 221  140 - 400 Thousand/uL Final   MPV 07/05/2024 10.4  7.5 - 12.5 fL Final   Neutro Abs 07/05/2024 2,509  1,500 - 7,800 cells/uL Final   Absolute Lymphocytes 07/05/2024 914  850 - 3,900 cells/uL Final   Absolute Monocytes 07/05/2024 529  200 - 950  cells/uL Final   Eosinophils Absolute 07/05/2024 119  15 - 500 cells/uL Final   Basophils Absolute 07/05/2024 29  0 - 200 cells/uL Final   Neutrophils Relative % 07/05/2024 61.2  % Final   Total Lymphocyte 07/05/2024 22.3  % Final   Monocytes Relative 07/05/2024 12.9  % Final   Eosinophils Relative 07/05/2024 2.9  % Final   Basophils Relative 07/05/2024 0.7  % Final   Glucose, Bld 07/05/2024 94  65 - 99 mg/dL Final   Comment: .            Fasting reference interval .    BUN 07/05/2024 20  7 - 25 mg/dL Final   Creat 91/74/7974 1.04  0.70 - 1.28 mg/dL Final   eGFR 91/74/7974 77  > OR = 60 mL/min/1.70m2 Final   BUN/Creatinine Ratio 07/05/2024 SEE NOTE:  6 - 22 (calc) Final   Comment:    Not Reported: BUN and Creatinine are within    reference range. .    Sodium 07/05/2024 140  135 - 146 mmol/L Final   Potassium 07/05/2024 3.9  3.5 - 5.3 mmol/L Final   Chloride 07/05/2024 104  98 - 110 mmol/L Final   CO2 07/05/2024 29  20 - 32 mmol/L Final   Calcium  07/05/2024 9.4  8.6 - 10.3 mg/dL Final   Total Protein 91/74/7974 6.7  6.1 - 8.1 g/dL Final   Albumin 91/74/7974 4.1  3.6 - 5.1 g/dL Final   Globulin 91/74/7974 2.6  1.9 - 3.7 g/dL (calc) Final   AG Ratio 07/05/2024 1.6  1.0 - 2.5 (calc) Final   Total Bilirubin 07/05/2024 1.2  0.2 - 1.2 mg/dL Final   Alkaline phosphatase (APISO) 07/05/2024 77  35 - 144 U/L Final   AST 07/05/2024 17  10 - 35 U/L Final   ALT 07/05/2024 12  9 - 46 U/L Final   Cholesterol 07/05/2024 121  <200 mg/dL Final   HDL 91/74/7974 34 (L)  > OR = 40 mg/dL Final   Triglycerides 91/74/7974 132  <150 mg/dL Final   LDL Cholesterol (Calc) 07/05/2024 65  mg/dL (calc) Final   Comment: Reference range: <100 . Desirable range <100 mg/dL for primary prevention;   <70 mg/dL for patients with CHD or diabetic patients  with > or = 2 CHD risk factors. SABRA LDL-C is now calculated using the Martin-Hopkins  calculation, which is a validated novel method providing  better accuracy  than the Friedewald equation in the  estimation of LDL-C.  Gladis APPLETHWAITE et al. SANDREA. 7986;689(80): 2061-2068  (http://education.[facility].com/faq/FAQ164)    Total CHOL/HDL Ratio 07/05/2024 3.6  <4.9 (calc) Final   Non-HDL Cholesterol (Calc) 07/05/2024 87  <130 mg/dL (calc) Final   Comment: For patients with diabetes plus 1 major ASCVD risk  factor, treating to a non-HDL-C goal of <100 mg/dL  (LDL-C of <29 mg/dL) is considered a therapeutic  option.    Patient had a colonoscopy in 2022 that showed 2 tubular adenoma was.  I recommended a repeat colonoscopy in 2027.  He is due for a PSA today to screen for prostate cancer.  Patient is due for a flu shot but he adamantly declines a flu shot.  He is also due for the shingles vaccine.  His pneumonia vaccines are up-to-date Immunization History  Administered Date(s) Administered   PPD Test 12/13/2013, 12/14/2014, 01/11/2016, 02/28/2017   Pneumococcal Conjugate-13 08/17/2018   Pneumococcal Polysaccharide-23 09/28/2019   Pneumococcal-Unspecified 11/24/2008   Td 11/11/2004   Tdap 12/14/2014    Past Medical History:  Diagnosis Date   Arthritis    ED (erectile dysfunction)    GERD (gastroesophageal reflux disease)    Hyperlipidemia    Hypertension    Pre-diabetes    Stroke (HCC)    TIA - around age 73   TIA (transient ischemic attack)    Vitamin D  deficiency    Past Surgical History:  Procedure Laterality Date   HERNIA REPAIR Right    INGUINAL   Current Outpatient Medications on File Prior to Visit  Medication Sig Dispense Refill   aspirin EC 81 MG tablet Take 162 mg by mouth daily.     atenolol  (TENORMIN ) 25 MG tablet TAKE 1 TABLET BY MOUTH TWICE  DAILY FOR BLOOD PRESSURE (Patient taking differently: Take 25 mg by mouth 2 (two) times daily. Take 1 tablet by mouth twice daily for blood pressure.) 200 tablet 2   atorvastatin  (LIPITOR) 40 MG tablet TAKE 1 TABLET BY MOUTH 4 TIMES  WEEKLY ON MONDAY WEDNESDAY  FRIDAY AND SATURDAY FOR   CHOLESTEROL 58 tablet 2   Cyanocobalamin (VITAMIN B 12) 500 MCG TABS Take 1 tablet by mouth daily.     furosemide  (LASIX ) 40 MG tablet Take 1 tablet by mouth 1 to 2 times per day for blood pressure and fluid retention/ankle  swelling 180 tablet 3   meloxicam  (MOBIC ) 15 MG tablet TAKE 1/2 TO 1 TABLET BY MOUTH  DAILY WITH FOOD FOR PAIN AND  INFLAMMATION 100 tablet .   pantoprazole  (PROTONIX ) 40 MG tablet TAKE 1 TABLET BY MOUTH DAILY TO  PREVENT HEARTBURN AND  INDIGESTION 100 tablet 2   spironolactone  (ALDACTONE ) 25 MG tablet TAKE 1 TABLET BY MOUTH DAILY 100 tablet 2   tadalafil  (CIALIS ) 20 MG tablet Take 1/2 to 1 tablet every 2 to 3 days as needed for XXXX 30 tablet 12   VITAMIN D  PO Take 10,000 Units by mouth 4 (four) times a week. MON, WED, FRI & SAT     tamsulosin  (FLOMAX ) 0.4 MG CAPS capsule TAKE 1 CAPSULE BY MOUTH DAILY  FOR PROSTATE (Patient not taking: Reported on 08/24/2024) 100 capsule 2   No current facility-administered medications on file prior to visit.   Allergies  Allergen Reactions   Sulfa Antibiotics Nausea Only   Social History   Socioeconomic History   Marital status: Single    Spouse name: Not on file   Number of children: Not on file   Years of education: Not on file   Highest education level: Not on file  Occupational History   Not on file  Tobacco Use   Smoking status: Former    Current packs/day: 0.00    Average packs/day: 0.7 packs/day for 52.8 years (34.3 ttl pk-yrs)    Types: Cigarettes    Start date: 48    Quit date: 09/2021    Years since quitting: 2.9   Smokeless tobacco: Never  Vaping Use   Vaping status: Never Used  Substance and Sexual Activity   Alcohol use: No    Alcohol/week: 0.0 standard drinks of alcohol   Drug use: No   Sexual activity: Yes    Birth control/protection: None  Other Topics Concern   Not on file  Social History Narrative   Not on file   Social Drivers of Health   Financial Resource Strain: Low Risk  (01/08/2024)    Overall Financial Resource Strain (CARDIA)    Difficulty of Paying Living Expenses: Not hard at all  Food Insecurity: No Food Insecurity (01/08/2024)   Hunger Vital Sign    Worried About Running Out of Food in the Last Year: Never true    Ran Out of Food in the Last Year: Never true  Transportation Needs: No Transportation Needs (01/08/2024)   PRAPARE - Administrator, Civil Service (Medical): No    Lack of Transportation (Non-Medical): No  Physical Activity: Inactive (01/08/2024)   Exercise Vital Sign    Days of Exercise per Week: 0 days    Minutes of Exercise per Session: 0 min  Stress: No Stress Concern Present (01/08/2024)   [employer] of Occupational Health - Occupational Stress Questionnaire    Feeling of Stress : Not at all  Social Connections: Moderately Integrated (01/08/2024)   Social Connection and Isolation Panel    Frequency of Communication with Friends and Family: More than three times a week    Frequency of Social Gatherings with Friends and Family: More than three times a week    Attends Religious Services: More than 4 times per year    Active Member of [employer] or Organizations: Yes    Attends Banker Meetings: More than 4 times per year    Marital Status: Widowed  Intimate Partner Violence: Not At Risk (01/08/2024)   Humiliation, Afraid, Rape, and Kick questionnaire  Fear of Current or Ex-Partner: No    Emotionally Abused: No    Physically Abused: No    Sexually Abused: No   Family History  Problem Relation Age of Onset   Cancer Mother        LYMPHOMA   Hyperlipidemia Mother    Hypertension Mother    Diabetes Father    Hypertension Father    Stroke Father    Kidney disease Father    Diabetes Sister    Drug abuse Son        DIED FROM OD   Early death Daughter    Prostate cancer Maternal Uncle    Colon cancer Neg Hx    Esophageal cancer Neg Hx    Pancreatic cancer Neg Hx    Rectal cancer Neg Hx    Stomach cancer Neg Hx       Review of Systems     Objective:   Physical Exam Constitutional:      General: He is not in acute distress.    Appearance: Normal appearance. He is not ill-appearing, toxic-appearing or diaphoretic.  HENT:     Right Ear: Tympanic membrane and ear canal normal.     Left Ear: Tympanic membrane and ear canal normal.     Nose: No congestion or rhinorrhea.     Mouth/Throat:     Pharynx: Oropharynx is clear. No oropharyngeal exudate or posterior oropharyngeal erythema.  Eyes:     Extraocular Movements: Extraocular movements intact.     Conjunctiva/sclera: Conjunctivae normal.     Pupils: Pupils are equal, round, and reactive to light.  Neck:     Vascular: No carotid bruit.  Cardiovascular:     Rate and Rhythm: Normal rate and regular rhythm.     Heart sounds: Normal heart sounds. No murmur heard.    No friction rub. No gallop.  Pulmonary:     Effort: Pulmonary effort is normal. No respiratory distress.     Breath sounds: Normal breath sounds. No stridor. No wheezing, rhonchi or rales.  Abdominal:     General: Abdomen is flat. Bowel sounds are normal. There is no distension.     Palpations: Abdomen is soft.     Tenderness: There is no abdominal tenderness. There is no guarding or rebound.  Musculoskeletal:     Right lower leg: Edema present.     Left lower leg: Edema present.  Lymphadenopathy:     Cervical: No cervical adenopathy.  Skin:    Findings: No rash.  Neurological:     General: No focal deficit present.     Mental Status: He is alert and oriented to person, place, and time.     Cranial Nerves: No cranial nerve deficit.     Motor: No weakness.     Coordination: Coordination normal.     Gait: Gait normal.     Deep Tendon Reflexes: Reflexes normal.  Psychiatric:        Mood and Affect: Mood normal.        Behavior: Behavior normal.           Assessment & Plan:  Prostate cancer screening - Plan: PSA  Screening for lung cancer - Plan: CT CHEST LUNG CA  SCREEN LOW DOSE W/O CM  Thoracic aortic atherosclerosis (HCC) - per CT 03/20/2022  Essential hypertension  Mixed hyperlipidemia  General medical exam Blood pressure today is excellent.  Lab work less than 2 months ago was outstanding.  I will screen for prostate cancer with PSA.  I will also screen for lung cancer with a CT scan of the chest given his smoking history.  We will also monitor the thoracic aortic aneurysm at the same CT scan.  Cholesterol is well-controlled with his LDL cholesterol less than 70.  Recommended a flu shot which he declined.  Recommended that shingles vaccine.  Patient's colonoscopy is due again in 2027.

## 2024-08-26 ENCOUNTER — Ambulatory Visit: Payer: Self-pay | Admitting: Family Medicine

## 2024-09-02 ENCOUNTER — Ambulatory Visit
Admission: RE | Admit: 2024-09-02 | Discharge: 2024-09-02 | Disposition: A | Discharge status: Home / Self Care | Source: Ambulatory Visit | Attending: Family Medicine | Admitting: Family Medicine

## 2024-09-02 ENCOUNTER — Telehealth: Payer: Self-pay

## 2024-09-02 DIAGNOSIS — Z122 Encounter for screening for malignant neoplasm of respiratory organs: Secondary | ICD-10-CM

## 2024-09-02 DIAGNOSIS — Z87891 Personal history of nicotine dependence: Secondary | ICD-10-CM | POA: Diagnosis not present

## 2024-09-02 NOTE — Telephone Encounter (Signed)
 Copied from CRM #[id number]. Topic: Clinical - Lab/Test Results >> Sep 02, 2024  3:59 PM Dedra B wrote: Reason for CRM: Pt wanted to let Dr. Duanne know that he had his CT scan lung today and would like a call when the results are received.

## 2024-09-07 DIAGNOSIS — X32XXXD Exposure to sunlight, subsequent encounter: Secondary | ICD-10-CM | POA: Diagnosis not present

## 2024-09-07 DIAGNOSIS — L570 Actinic keratosis: Secondary | ICD-10-CM | POA: Diagnosis not present

## 2024-11-29 ENCOUNTER — Other Ambulatory Visit: Payer: Self-pay | Admitting: Family Medicine

## 2024-11-29 DIAGNOSIS — E782 Mixed hyperlipidemia: Secondary | ICD-10-CM

## 2024-11-29 NOTE — Telephone Encounter (Unsigned)
 Copied from CRM [id number]. Topic: Clinical - Medication Refill >> Nov 29, 2024 12:13 PM [place] MATSU wrote: Medication: atorvastatin  (LIPITOR) 40 MG tablet   Has the patient contacted their pharmacy? Yes (Agent: If no, request that the patient contact the pharmacy for the refill. If patient does not wish to contact the pharmacy document the reason why and proceed with request.) (Agent: If yes, when and what did the pharmacy advise?)  This is the patient's preferred pharmacy:  [facility] [place] [place] - [address] [place] [address] Phone: [phone] Fax: [phone]  Is this the correct pharmacy for this prescription? Yes If no, delete pharmacy and type the correct one.   Has the prescription been filled recently? Yes  Is the patient out of the medication? Yes for 3 weeks.   Has the patient been seen for an appointment in the last year OR does the patient have an upcoming appointment? Yes  Can we respond through MyChart? no  Agent: Please be advised that Rx refills may take up to 3 business days. We ask that you follow-up with your pharmacy.

## 2024-11-30 MED ORDER — ATORVASTATIN CALCIUM 40 MG PO TABS: ORAL_TABLET | ORAL | 2 refills | Status: AC

## 2024-11-30 NOTE — Telephone Encounter (Signed)
 Requested Prescriptions  Pending Prescriptions Disp Refills   atorvastatin  (LIPITOR) 40 MG tablet 58 tablet 2    Sig: TAKE 1 TABLET BY MOUTH 4 TIMES  WEEKLY ON MONDAY WEDNESDAY  FRIDAY AND SATURDAY FOR  CHOLESTEROL     Cardiovascular:  Antilipid - Statins Failed - 11/30/2024 11:47 AM      Failed - Lipid Panel in normal range within the last 12 months    Cholesterol  Date Value Ref Range Status  07/05/2024 121 <200 mg/dL Final   LDL Cholesterol (Calc)  Date Value Ref Range Status  07/05/2024 65 mg/dL (calc) Final    Comment:    Reference range: <100 . Desirable range <100 mg/dL for primary prevention;   <70 mg/dL for patients with CHD or diabetic patients  with > or = 2 CHD risk factors. SABRA LDL-C is now calculated using the Martin-Hopkins  calculation, which is a validated novel method providing  better accuracy than the Friedewald equation in the  estimation of LDL-C.  Gladis APPLETHWAITE et al. SANDREA. 7986;689(80): 2061-2068  (http://education.[facility].com/faq/FAQ164)    HDL  Date Value Ref Range Status  07/05/2024 34 (L) > OR = 40 mg/dL Final   Triglycerides  Date Value Ref Range Status  07/05/2024 132 <150 mg/dL Final         Passed - Patient is not pregnant      Passed - Valid encounter within last 12 months    Recent Outpatient Visits           3 months ago Prostate cancer screening   [place] [facility] Duanne Butler DASEN, MD   4 months ago Primary hypertension   [facility] Pickard, Butler DASEN, MD   1 year ago Essential hypertension   [place] [facility] Duanne Butler DASEN, MD   2 years ago Thoracic aortic atherosclerosis [facility]) - per CT 03/20/2022   [place] [facility] Pickard, Butler DASEN, MD

## 2025-01-13 ENCOUNTER — Encounter: Payer: Medicare Other | Admitting: Family Medicine

## 2025-08-26 ENCOUNTER — Encounter: Admitting: Family Medicine
